# Patient Record
Sex: Female | Born: 1975 | Race: White | Hispanic: No | Smoking: Never smoker
Health system: Southern US, Community
[De-identification: ages and names within clinical notes are randomized; demographics above are authoritative.]

## PROBLEM LIST (undated history)

## (undated) DIAGNOSIS — R51 Headache: Secondary | ICD-10-CM

## (undated) DIAGNOSIS — J069 Acute upper respiratory infection, unspecified: Secondary | ICD-10-CM

## (undated) DIAGNOSIS — T7840XA Allergy, unspecified, initial encounter: Secondary | ICD-10-CM

## (undated) DIAGNOSIS — F410 Panic disorder [episodic paroxysmal anxiety] without agoraphobia: Secondary | ICD-10-CM

## (undated) DIAGNOSIS — R87619 Unspecified abnormal cytological findings in specimens from cervix uteri: Secondary | ICD-10-CM

## (undated) DIAGNOSIS — F32A Depression, unspecified: Secondary | ICD-10-CM

## (undated) DIAGNOSIS — IMO0002 Reserved for concepts with insufficient information to code with codable children: Secondary | ICD-10-CM

## (undated) DIAGNOSIS — M1990 Unspecified osteoarthritis, unspecified site: Secondary | ICD-10-CM

## (undated) DIAGNOSIS — J302 Other seasonal allergic rhinitis: Secondary | ICD-10-CM

## (undated) DIAGNOSIS — F329 Major depressive disorder, single episode, unspecified: Secondary | ICD-10-CM

## (undated) DIAGNOSIS — H9193 Unspecified hearing loss, bilateral: Secondary | ICD-10-CM

## (undated) DIAGNOSIS — J45909 Unspecified asthma, uncomplicated: Secondary | ICD-10-CM

## (undated) DIAGNOSIS — M7551 Bursitis of right shoulder: Secondary | ICD-10-CM

## (undated) DIAGNOSIS — H905 Unspecified sensorineural hearing loss: Secondary | ICD-10-CM

## (undated) DIAGNOSIS — Z975 Presence of (intrauterine) contraceptive device: Secondary | ICD-10-CM

## (undated) DIAGNOSIS — K219 Gastro-esophageal reflux disease without esophagitis: Secondary | ICD-10-CM

## (undated) DIAGNOSIS — Z8669 Personal history of other diseases of the nervous system and sense organs: Secondary | ICD-10-CM

## (undated) DIAGNOSIS — N809 Endometriosis, unspecified: Secondary | ICD-10-CM

## (undated) DIAGNOSIS — F419 Anxiety disorder, unspecified: Secondary | ICD-10-CM

## (undated) HISTORY — DX: Presence of (intrauterine) contraceptive device: Z97.5

## (undated) HISTORY — DX: Panic disorder (episodic paroxysmal anxiety): F41.0

## (undated) HISTORY — DX: Reserved for concepts with insufficient information to code with codable children: IMO0002

## (undated) HISTORY — DX: Unspecified abnormal cytological findings in specimens from cervix uteri: R87.619

## (undated) HISTORY — DX: Unspecified sensorineural hearing loss: H90.5

## (undated) HISTORY — DX: Personal history of other diseases of the nervous system and sense organs: Z86.69

## (undated) HISTORY — PX: WISDOM TOOTH EXTRACTION: SHX21

## (undated) HISTORY — DX: Unspecified osteoarthritis, unspecified site: M19.90

## (undated) HISTORY — PX: IMPLANTATION OF ENVOY ESTEEM HEARING DEVICE: SHX5558

## (undated) HISTORY — DX: Endometriosis, unspecified: N80.9

## (undated) HISTORY — DX: Unspecified asthma, uncomplicated: J45.909

## (undated) HISTORY — DX: Anxiety disorder, unspecified: F41.9

## (undated) HISTORY — DX: Acute upper respiratory infection, unspecified: J06.9

## (undated) HISTORY — DX: Allergy, unspecified, initial encounter: T78.40XA

## (undated) HISTORY — DX: Gastro-esophageal reflux disease without esophagitis: K21.9

---

## 1999-10-27 ENCOUNTER — Other Ambulatory Visit: Admission: RE | Admit: 1999-10-27 | Discharge: 1999-10-27 | Payer: Self-pay | Admitting: *Deleted

## 2000-10-31 ENCOUNTER — Other Ambulatory Visit: Admission: RE | Admit: 2000-10-31 | Discharge: 2000-10-31 | Payer: Self-pay | Admitting: *Deleted

## 2001-03-10 ENCOUNTER — Encounter: Payer: Self-pay | Admitting: Emergency Medicine

## 2001-03-10 ENCOUNTER — Emergency Department (HOSPITAL_COMMUNITY): Admission: EM | Admit: 2001-03-10 | Discharge: 2001-03-10 | Payer: Self-pay | Admitting: Emergency Medicine

## 2001-09-26 ENCOUNTER — Other Ambulatory Visit: Admission: RE | Admit: 2001-09-26 | Discharge: 2001-09-26 | Payer: Self-pay | Admitting: Obstetrics and Gynecology

## 2002-09-28 ENCOUNTER — Other Ambulatory Visit: Admission: RE | Admit: 2002-09-28 | Discharge: 2002-09-28 | Payer: Self-pay | Admitting: Obstetrics and Gynecology

## 2002-12-01 ENCOUNTER — Emergency Department (HOSPITAL_COMMUNITY): Admission: EM | Admit: 2002-12-01 | Discharge: 2002-12-02 | Payer: Self-pay

## 2003-01-29 ENCOUNTER — Ambulatory Visit (HOSPITAL_COMMUNITY): Admission: RE | Admit: 2003-01-29 | Discharge: 2003-01-29 | Payer: Self-pay | Admitting: Neurology

## 2003-10-12 ENCOUNTER — Other Ambulatory Visit: Admission: RE | Admit: 2003-10-12 | Discharge: 2003-10-12 | Payer: Self-pay | Admitting: Obstetrics and Gynecology

## 2004-08-16 ENCOUNTER — Ambulatory Visit: Payer: Self-pay | Admitting: Internal Medicine

## 2004-08-25 ENCOUNTER — Ambulatory Visit: Payer: Self-pay | Admitting: Internal Medicine

## 2004-10-25 ENCOUNTER — Ambulatory Visit: Payer: Self-pay | Admitting: Internal Medicine

## 2004-11-24 ENCOUNTER — Ambulatory Visit: Payer: Self-pay | Admitting: Internal Medicine

## 2005-02-28 ENCOUNTER — Ambulatory Visit: Payer: Self-pay | Admitting: Internal Medicine

## 2005-05-29 ENCOUNTER — Ambulatory Visit: Payer: Self-pay | Admitting: Internal Medicine

## 2006-05-08 ENCOUNTER — Ambulatory Visit: Payer: Self-pay | Admitting: Internal Medicine

## 2007-03-13 ENCOUNTER — Other Ambulatory Visit: Admission: RE | Admit: 2007-03-13 | Discharge: 2007-03-13 | Payer: Self-pay | Admitting: Obstetrics & Gynecology

## 2007-03-20 ENCOUNTER — Encounter: Payer: Self-pay | Admitting: Internal Medicine

## 2007-03-20 DIAGNOSIS — E538 Deficiency of other specified B group vitamins: Secondary | ICD-10-CM | POA: Insufficient documentation

## 2007-08-03 ENCOUNTER — Ambulatory Visit (HOSPITAL_COMMUNITY): Admission: RE | Admit: 2007-08-03 | Discharge: 2007-08-03 | Payer: Self-pay

## 2007-10-28 ENCOUNTER — Encounter: Admission: RE | Admit: 2007-10-28 | Discharge: 2007-10-28 | Payer: Self-pay | Admitting: Neurology

## 2008-03-22 ENCOUNTER — Other Ambulatory Visit: Admission: RE | Admit: 2008-03-22 | Discharge: 2008-03-22 | Payer: Self-pay | Admitting: Obstetrics & Gynecology

## 2008-08-02 ENCOUNTER — Other Ambulatory Visit: Admission: RE | Admit: 2008-08-02 | Discharge: 2008-08-02 | Payer: Self-pay | Admitting: Obstetrics & Gynecology

## 2009-03-09 HISTORY — PX: BREAST REDUCTION SURGERY: SHX8

## 2009-04-08 DIAGNOSIS — N809 Endometriosis, unspecified: Secondary | ICD-10-CM

## 2009-04-08 HISTORY — DX: Endometriosis, unspecified: N80.9

## 2009-04-08 HISTORY — PX: TUBAL LIGATION: SHX77

## 2009-04-18 ENCOUNTER — Ambulatory Visit (HOSPITAL_COMMUNITY): Admission: RE | Admit: 2009-04-18 | Discharge: 2009-04-18 | Payer: Self-pay | Admitting: Obstetrics & Gynecology

## 2010-10-12 LAB — CBC
HCT: 39.6 % (ref 36.0–46.0)
Hemoglobin: 13.4 g/dL (ref 12.0–15.0)
MCHC: 33.7 g/dL (ref 30.0–36.0)
MCV: 89.9 fL (ref 78.0–100.0)
Platelets: 229 10*3/uL (ref 150–400)
RBC: 4.41 MIL/uL (ref 3.87–5.11)
RDW: 12.2 % (ref 11.5–15.5)
WBC: 6.4 10*3/uL (ref 4.0–10.5)

## 2010-10-12 LAB — URINALYSIS, ROUTINE W REFLEX MICROSCOPIC
Protein, ur: NEGATIVE mg/dL
Urobilinogen, UA: 0.2 mg/dL (ref 0.0–1.0)

## 2011-08-30 LAB — HM PAP SMEAR

## 2012-07-09 HISTORY — PX: COCHLEAR IMPLANT: SUR684

## 2012-07-31 ENCOUNTER — Other Ambulatory Visit: Payer: Self-pay | Admitting: Otolaryngology

## 2012-07-31 DIAGNOSIS — H903 Sensorineural hearing loss, bilateral: Secondary | ICD-10-CM

## 2012-07-31 DIAGNOSIS — H905 Unspecified sensorineural hearing loss: Secondary | ICD-10-CM

## 2012-08-05 ENCOUNTER — Other Ambulatory Visit: Payer: Self-pay

## 2012-08-15 ENCOUNTER — Ambulatory Visit
Admission: RE | Admit: 2012-08-15 | Discharge: 2012-08-15 | Disposition: A | Discharge status: Home / Self Care | Payer: 59 | Source: Ambulatory Visit | Attending: Otolaryngology | Admitting: Otolaryngology

## 2012-08-15 DIAGNOSIS — H903 Sensorineural hearing loss, bilateral: Secondary | ICD-10-CM

## 2012-08-15 DIAGNOSIS — H905 Unspecified sensorineural hearing loss: Secondary | ICD-10-CM

## 2012-09-25 ENCOUNTER — Ambulatory Visit (INDEPENDENT_AMBULATORY_CARE_PROVIDER_SITE_OTHER): Payer: 59 | Admitting: Obstetrics & Gynecology

## 2012-09-25 ENCOUNTER — Ambulatory Visit: Admit: 2012-09-25 | Payer: Self-pay | Admitting: Otolaryngology

## 2012-09-25 ENCOUNTER — Encounter: Payer: Self-pay | Admitting: Obstetrics & Gynecology

## 2012-09-25 VITALS — BP 120/68 | Ht 66.0 in | Wt 162.2 lb

## 2012-09-25 DIAGNOSIS — Z0000 Encounter for general adult medical examination without abnormal findings: Secondary | ICD-10-CM

## 2012-09-25 DIAGNOSIS — Z01419 Encounter for gynecological examination (general) (routine) without abnormal findings: Secondary | ICD-10-CM

## 2012-09-25 LAB — POCT URINALYSIS DIPSTICK
Blood, UA: NEGATIVE
Nitrite, UA: NEGATIVE
Urobilinogen, UA: NEGATIVE
pH, UA: 5

## 2012-09-25 SURGERY — INSERTION, IMPLANT, COCHLEAR
Anesthesia: General | Site: Ear | Laterality: Left

## 2012-09-25 MED ORDER — ONDANSETRON 4 MG PO TBDP: 4.0000 mg | ORAL_TABLET | Freq: Three times a day (TID) | ORAL | Status: DC | PRN

## 2012-09-25 NOTE — Progress Notes (Signed)
37 y.o. G2P1 MarriedCaucasianF here for annual exam.  Cochlear implant surgery planned for next week.  Has appt with Dr. Ricki Mase Dhondt in April.  After IUD was placed, she had several months of doing really well.  For the last six months, she has nausea/emesis, then a migraine, then period starts.  Officially divorced.  Was in [place] last week for pleasure.     Patient's last menstrual period was 09/23/2012.           Sexually active: yes  The current method of family planning is tubal ligation and IUD.    Exercising: yes  Home exercise routine includes running, eliptical, and kettle bell. Smoker:  no  Health Maintenance: Pap:  08/30/2011 MMG:  02/06/2011 Colonoscopy:  n/a BMD:   n/a TDaP:  08/30/11   reports that she has never smoked. She has never used smokeless tobacco. She reports that  drinks alcohol. She reports that she does not use illicit drugs.  Past Medical History  Diagnosis Date  . Congenital hearing loss     started age 23  . Endometriosis 10/10  . Hx of migraines     increased frequency with estrogen containing OCPs  . Abnormal Pap smear     history of colposcopy ECC with atypical squamous metaplasia    Past Surgical History  Procedure Laterality Date  . Implantation of envoy esteem hearing device      for hearing loss  . Breast reduction surgery  9/10  . Tubal ligation  10/10    cautery of endometriosis    Current Outpatient Prescriptions  Medication Sig Dispense Refill  . ALPRAZolam (XANAX) 0.5 MG tablet Take 0.5 mg by mouth as needed for sleep.      . clindamycin (CLEOCIN) 300 MG capsule       . clonazePAM (KLONOPIN) 1 MG tablet Take 1 mg by mouth at bedtime as needed for anxiety.      . Cyclobenzaprine HCl (FLEXERIL PO) Take by mouth as needed.      Marland Kitchen Fexofenadine HCl (ALLEGRA PO) Take by mouth as needed.      . Levonorgestrel (MIRENA IU) by Intrauterine route.      . Multiple Vitamins-Minerals (MULTIVITAMIN GUMMIES ADULT PO) Take by mouth daily.      Marland Kitchen  tobramycin-dexamethasone (TOBRADEX) ophthalmic solution As needed      . vitamin C (ASCORBIC ACID) 500 MG tablet Take 500 mg by mouth daily.       No current facility-administered medications for this visit.    Family History  Problem Relation Age of Onset  . Deafness Mother   . Diabetes Mother   . Cancer Mother 50    ovarian  . Heart Problems Mother     tachycardia  . Deafness Father   . Hypertension Father   . Diabetes Father   . Down syndrome Sister   . Diabetes Paternal Grandmother   . Cirrhosis Father     non alcoholic    ROS:  Pertinent items are noted in HPI.  Otherwise, a comprehensive ROS was negative.  Exam:   BP 120/68  Ht 5\' 6"  (1.676 m)  Wt 162 lb 3.2 oz (73.573 kg)  BMI 26.19 kg/m2  LMP 09/23/2012  Height: 5\' 6"  (167.6 cm)  Ht Readings from Last 3 Encounters:  09/25/12 5\' 6"  (1.676 m)    General appearance: alert, cooperative and appears stated age Head: Normocephalic, without obvious abnormality, atraumatic Neck: no adenopathy, supple, symmetrical, trachea midline and thyroid not enlarged, symmetric, no  tenderness/mass/nodules Lungs: clear to auscultation bilaterally Breasts: Inspection negative, No nipple retraction or dimpling, No nipple discharge or bleeding, No axillary or supraclavicular adenopathy, Normal to palpation without dominant masses Heart: regular rate and rhythm Abdomen: soft, non-tender; bowel sounds normal; no masses,  no organomegaly Extremities: extremities normal, atraumatic, no cyanosis or edema Skin: Skin color, texture, turgor normal. No rashes or lesions Lymph nodes: Cervical, supraclavicular, and axillary nodes normal. No abnormal inguinal nodes palpated Neurologic: Grossly normal   Pelvic: External genitalia:  no lesions              Urethra:  normal appearing urethra with no masses, tenderness or lesions              Bartholins and Skenes: normal                 Vagina: normal appearing vagina with normal color and  discharge, no lesions              Cervix: no lesions, IUD string noted              Pap taken: no Bimanual Exam:  Uterus:  normal size, contour, position, consistency, mobility, non-tender              Adnexa: normal adnexa               Rectovaginal: Confirms               Anus:  normal sphincter tone, no lesions  A:  Well Woman with normal exam, endometriosis, hearing loss--cochlear implant planned, pre-cycle nausea/emesis/migraines  P:   IUD for contraception/bleeding treatment Trial of Zofran for nausea.  Cannot use migraine medications.  Other antiemetics makes her too nauseated. Return annually or prn  An After Visit Summary was printed and given to the patient.

## 2012-09-25 NOTE — Addendum Note (Signed)
Addended by: Jerene Bears on: 09/25/2012 05:01 PM   Modules accepted: Orders

## 2012-09-25 NOTE — Patient Instructions (Addendum)
Follow up one year or earlier if having gynecologic issues. Ultrasound will be scheduled and you will be called.  If you have not heard from our office within one week--please call back.

## 2013-01-16 ENCOUNTER — Telehealth: Payer: Self-pay | Admitting: Obstetrics & Gynecology

## 2013-01-16 DIAGNOSIS — Z8041 Family history of malignant neoplasm of ovary: Secondary | ICD-10-CM

## 2013-01-16 NOTE — Telephone Encounter (Signed)
Patient is ready to schedule ultrasound appointment with Dr. Hyacinth Meeker.

## 2013-01-28 NOTE — Telephone Encounter (Signed)
Last AEX 09/25/2012 EPIC per Dr. Hyacinth Meeker Last vaginal ultra sound done 08/30/2010 report in paper chart. Patient states she had talked with Dr. Hyacinth Meeker after her last AEX in march and request to schedule at the end of the year when she had met her insurance deductible. Request to schedule this in November ,2014. Stated she had not heard from any one concerning scheduling her ultra sound.  Please advise.

## 2013-01-28 NOTE — Telephone Encounter (Signed)
Call to patient and PUS scheduled for 05-14-13 and consult to follow with Dr Hyacinth Meeker.

## 2013-01-28 NOTE — Telephone Encounter (Signed)
Pt says she has not gotten a call back yet concerning scheduling a ultrasound appointment.

## 2013-01-28 NOTE — Telephone Encounter (Signed)
Margaret Ford, Can you please schedule u/s for Margaret Ford.  Her mother died from ovarian cancer.  She has contemplated hysterectomy/ovary removal but it trying to get well into her 40's before doing this.  Looks like Arna Medici took original msg 7/11 and sent it to [employer] [employer].  Thanks.  MSM

## 2013-02-13 ENCOUNTER — Ambulatory Visit (INDEPENDENT_AMBULATORY_CARE_PROVIDER_SITE_OTHER): Payer: 59 | Admitting: Certified Nurse Midwife

## 2013-02-13 ENCOUNTER — Encounter: Payer: Self-pay | Admitting: Certified Nurse Midwife

## 2013-02-13 VITALS — BP 100/62 | HR 64 | Temp 98.4°F | Resp 16 | Ht 66.0 in | Wt 165.0 lb

## 2013-02-13 DIAGNOSIS — N390 Urinary tract infection, site not specified: Secondary | ICD-10-CM | POA: Insufficient documentation

## 2013-02-13 LAB — POCT URINALYSIS DIPSTICK
Ketones, UA: NEGATIVE
Protein, UA: NEGATIVE
pH, UA: 5

## 2013-02-13 MED ORDER — NITROFURANTOIN MONOHYD MACRO 100 MG PO CAPS: 100.0000 mg | ORAL_CAPSULE | Freq: Two times a day (BID) | ORAL | Status: DC

## 2013-02-13 NOTE — Progress Notes (Signed)
37 y.o.DivorcedCaucasian female G2P1 with a 3 day(s) history of the following: urinary pain. Sexually active: yes Last sexual activity:8days ago. Pt also reports the following associated symptoms: urinary frequency and urinary urgency for 2 - 3 days. Denies vaginal symptoms or new personal products. Patient has not tried over the counter treatment. Denies fever, chills or back pain.     Exam:  [id number], Urethra, Skene's normal                Vag:no lesions                Cx:  normal appearance IUD string noted                Uterus:normal size, non-tender, normal shape and consistency                Adnexa: normal adnexa and no mass, fullness, tenderness  Poct urine- wbc 2+  A: UTI  P;Reviewed findings of UTI, warning signs given. [id number] culture Rx Macrobid see order  Rv prn or if culture is positive will need TOC

## 2013-02-14 NOTE — Progress Notes (Signed)
Note reviewed, agree with plan.  Sicily Zaragoza, MD  

## 2013-02-16 LAB — URINE CULTURE

## 2013-02-17 ENCOUNTER — Other Ambulatory Visit: Payer: Self-pay | Admitting: Certified Nurse Midwife

## 2013-02-17 DIAGNOSIS — N390 Urinary tract infection, site not specified: Secondary | ICD-10-CM

## 2013-02-17 MED ORDER — SULFAMETHOXAZOLE-TMP DS 800-160 MG PO TABS: 1.0000 | ORAL_TABLET | Freq: Two times a day (BID) | ORAL | Status: DC

## 2013-02-18 ENCOUNTER — Telehealth: Payer: Self-pay

## 2013-02-18 NOTE — Telephone Encounter (Signed)
Patient notified

## 2013-02-18 NOTE — Telephone Encounter (Signed)
Message copied by Eliezer Bottom on Wed Feb 18, 2013 10:07 AM ------      Message from: Verner Chol      Created: Tue Feb 17, 2013  9:56 PM       Notify patient urine culture positive Klebsiella      Needs change in medication, due to sensitivity      Needs RX Septra order in      Needs TOC 2 weeks order in ------

## 2013-02-18 NOTE — Telephone Encounter (Signed)
lmtcb

## 2013-03-05 ENCOUNTER — Other Ambulatory Visit: Payer: 59

## 2013-03-06 ENCOUNTER — Ambulatory Visit (INDEPENDENT_AMBULATORY_CARE_PROVIDER_SITE_OTHER): Payer: 59

## 2013-03-06 VITALS — BP 110/64 | HR 64 | Resp 16 | Ht 66.0 in

## 2013-03-06 DIAGNOSIS — N390 Urinary tract infection, site not specified: Secondary | ICD-10-CM

## 2013-03-06 NOTE — Progress Notes (Signed)
Pt states no problems today. Urine sent for culture

## 2013-03-08 LAB — URINE CULTURE
Colony Count: NO GROWTH
Organism ID, Bacteria: NO GROWTH

## 2013-03-10 ENCOUNTER — Telehealth: Payer: Self-pay

## 2013-03-10 NOTE — Telephone Encounter (Signed)
Message copied by Eliezer Bottom on Tue Mar 10, 2013  2:22 PM ------      Message from: Verner Chol      Created: Sun Mar 08, 2013  8:55 PM       Notify urine culture negative ------

## 2013-03-10 NOTE — Telephone Encounter (Signed)
lmtcb

## 2013-03-10 NOTE — Telephone Encounter (Signed)
Patient notified

## 2013-03-10 NOTE — Telephone Encounter (Signed)
Patient returning Joy's call. "Okay to leave information on voice mail," per patient.

## 2013-04-03 ENCOUNTER — Encounter: Payer: Self-pay | Admitting: Obstetrics & Gynecology

## 2013-05-14 ENCOUNTER — Ambulatory Visit (INDEPENDENT_AMBULATORY_CARE_PROVIDER_SITE_OTHER): Payer: 59 | Admitting: Obstetrics & Gynecology

## 2013-05-14 ENCOUNTER — Ambulatory Visit (INDEPENDENT_AMBULATORY_CARE_PROVIDER_SITE_OTHER): Payer: 59

## 2013-05-14 VITALS — BP 102/76 | Ht 66.0 in | Wt 161.4 lb

## 2013-05-14 DIAGNOSIS — Z8041 Family history of malignant neoplasm of ovary: Secondary | ICD-10-CM

## 2013-05-14 DIAGNOSIS — N949 Unspecified condition associated with female genital organs and menstrual cycle: Secondary | ICD-10-CM

## 2013-05-14 DIAGNOSIS — Z8742 Personal history of other diseases of the female genital tract: Secondary | ICD-10-CM

## 2013-05-14 NOTE — Progress Notes (Signed)
37 y.o.Divorcedfemale here for a pelvic ultrasound due to family hx of ovarian cancer.  Pt reports cousin, age 65, with breast cancer.  Also reports grandmother has some sort of gyn cancer issue and had complete hysterectomy in her 30's, including ovary removal.  Grandmother is alive but has Alzheimer's and cannot give any history.  No LMP recorded. Patient is not currently having periods (Reason: IUD).  Sexually active:  yes  Contraception: IUD  FINDINGS: UTERUS: 8.1 x 4.5 x 3.3cm without any other abnormalities.  IUD in correct location. EMS: 3.53mm ADNEXA:   Left ovary 3.3 x 2.6 x 1.7cm with 14 mm dominant follicle   Right ovary 2.3 x 1.6 x 1.2cm CUL DE SAC: no free fluid  Pictures reviewed with patient.  All questions answered.  "Screening" for ovarian cancer discussed, with positive and negative aspects discussed.  With new family hx that patient gives, discused genetic testing for BRCA 1/2.  She has considered this in the past.  Cannot guarantee coverage but she would like to proceed.  Aware increased surveillance vs surgical treatment options vs oral chemotherapy for prevention are all options if testing is positive.  Voices clear understanding.  Assessment:  Family hx of breast cancer.  IUD in correct location. Plan:  Ca-125 today BRCA 1/2 testing obtained.  ~20 minutes spent with patient >50% of time was in face to face discussion of above.

## 2013-05-14 NOTE — Patient Instructions (Signed)
We will call you with results  

## 2013-05-16 ENCOUNTER — Encounter: Payer: Self-pay | Admitting: Obstetrics & Gynecology

## 2013-05-18 ENCOUNTER — Telehealth: Payer: Self-pay

## 2013-05-18 NOTE — Telephone Encounter (Signed)
Message copied by Elisha Headland on Mon May 18, 2013  2:09 PM ------      Message from: Jerene Bears      Created: Sat May 16, 2013  6:16 AM       Inform ca-125 normal ------

## 2013-05-18 NOTE — Telephone Encounter (Signed)
lmtcb

## 2013-05-20 ENCOUNTER — Encounter: Payer: Self-pay | Admitting: Obstetrics & Gynecology

## 2013-05-20 MED ORDER — CYCLOBENZAPRINE HCL 10 MG PO TABS: 10.0000 mg | ORAL_TABLET | Freq: Three times a day (TID) | ORAL | Status: DC | PRN

## 2013-05-20 NOTE — Telephone Encounter (Signed)
Rx to pharmacy after MyChart message from patient.  Uses flexeril with menstrual cycles.  It helps much more than anti-inflammatories.  Uses maybe one prescription in calendar year.

## 2013-06-01 ENCOUNTER — Telehealth: Payer: Self-pay | Admitting: *Deleted

## 2013-06-01 NOTE — Telephone Encounter (Signed)
Patient returned call. Notified BRCA testing negative per Dr Hyacinth Meeker. See scanned results.  Routing to provider for final review. Patient agreeable to disposition. Will close encounter

## 2013-06-01 NOTE — Telephone Encounter (Signed)
Calling patient with lab results, LMTCB.

## 2013-06-22 NOTE — Telephone Encounter (Signed)
Patient notified of results.

## 2013-09-30 ENCOUNTER — Ambulatory Visit: Payer: 59 | Admitting: Obstetrics & Gynecology

## 2013-10-02 ENCOUNTER — Encounter: Payer: Self-pay | Admitting: Obstetrics & Gynecology

## 2013-10-02 ENCOUNTER — Ambulatory Visit (INDEPENDENT_AMBULATORY_CARE_PROVIDER_SITE_OTHER): Payer: 59 | Admitting: Obstetrics & Gynecology

## 2013-10-02 VITALS — BP 100/64 | HR 60 | Resp 16 | Ht 66.0 in | Wt 162.2 lb

## 2013-10-02 DIAGNOSIS — Z01419 Encounter for gynecological examination (general) (routine) without abnormal findings: Secondary | ICD-10-CM

## 2013-10-02 DIAGNOSIS — N809 Endometriosis, unspecified: Secondary | ICD-10-CM

## 2013-10-02 DIAGNOSIS — Z202 Contact with and (suspected) exposure to infections with a predominantly sexual mode of transmission: Secondary | ICD-10-CM

## 2013-10-02 DIAGNOSIS — Z124 Encounter for screening for malignant neoplasm of cervix: Secondary | ICD-10-CM

## 2013-10-02 DIAGNOSIS — Z0000 Encounter for general adult medical examination without abnormal findings: Secondary | ICD-10-CM

## 2013-10-02 LAB — POCT URINALYSIS DIPSTICK
BILIRUBIN UA: NEGATIVE
GLUCOSE UA: NEGATIVE
KETONES UA: NEGATIVE
NITRITE UA: NEGATIVE
PH UA: 5
Protein, UA: NEGATIVE
RBC UA: NEGATIVE
Urobilinogen, UA: NEGATIVE

## 2013-10-02 NOTE — Progress Notes (Signed)
38 y.o. G2P1 DivorcedCaucasianF here for annual exam.  Bleeding only once every 4-5 months.  Spotting only.  Did have pain with it.  Otherwise, not having any pain.    Sees Dr. Minna Antis next month.  Does blood work with him.  On Viibryd 20mg  and doing really well with this.  New sex partner.  Will do Gc/Chl testing today.  Patient's last menstrual period was 07/09/2013.          Sexually active: yes  The current method of family planning is tubal ligation and IUD.    Exercising: yes  walking and running Smoker:  no  Health Maintenance: Pap:  08/30/11 WNL/negative HR HPV History of abnormal Pap:  Yes-LGSIL, colpo/ECC with atypical squ metaplasia MMG:  08/08/10 MMG, 7/12 6 month f/u-normal Colonoscopy:  none BMD:   none TDaP:  08/30/11 Screening Labs: PCP, Hb today: PCP, Urine today: WBC-trace   reports that she has never smoked. She has never used smokeless tobacco. She reports that she drinks alcohol. She reports that she does not use illicit drugs.  Past Medical History  Diagnosis Date  . Congenital hearing loss     started age 84  . Endometriosis 10/10  . Hx of migraines     increased frequency with estrogen containing OCPs  . Abnormal Pap smear     history of colposcopy ECC with atypical squamous metaplasia    Past Surgical History  Procedure Laterality Date  . Implantation of envoy esteem hearing device      for hearing loss  . Breast reduction surgery  9/10  . Tubal ligation  10/10    cautery of endometriosis    Current Outpatient Prescriptions  Medication Sig Dispense Refill  . clonazePAM (KLONOPIN) 1 MG tablet Take 1 mg by mouth at bedtime as needed for anxiety.      . cyclobenzaprine (FLEXERIL) 10 MG tablet Take 1 tablet (10 mg total) by mouth 3 (three) times daily as needed for muscle spasms.  30 tablet  1  . ELDERBERRY PO Take by mouth daily.      Marland Kitchen Fexofenadine HCl (ALLEGRA PO) Take by mouth as needed.      . Levonorgestrel (MIRENA IU) by Intrauterine route.       . Multiple Vitamins-Minerals (MULTIVITAMIN GUMMIES ADULT PO) Take by mouth daily. Super food      . VIIBRYD 20 MG TABS       . Cholecalciferol (VITAMIN D PO) Take 2,000 Int'l Units by mouth daily.      Marland Kitchen PROAIR HFA 108 (90 BASE) MCG/ACT inhaler        No current facility-administered medications for this visit.    Family History  Problem Relation Age of Onset  . Deafness Mother   . Diabetes Mother   . Cancer Mother 4    ovarian  . Heart Problems Mother     tachycardia  . Deafness Father   . Hypertension Father   . Diabetes Father   . Down syndrome Sister   . Diabetes Paternal Grandmother   . Cirrhosis Father     non alcoholic    ROS:  Pertinent items are noted in HPI.  Otherwise, a comprehensive ROS was negative.  Exam:   BP 100/64  Pulse 60  Resp 16  Ht 5\' 6"  (1.676 m)  Wt 162 lb 3.2 oz (73.573 kg)  BMI 26.19 kg/m2  LMP 07/09/2013  Weight change: +10lbs  Height: 5\' 6"  (167.6 cm)  Ht Readings from Last 3 Encounters:  10/02/13 5\' 6"  (1.676 m)  05/14/13 5\' 6"  (1.676 m)  03/06/13 5\' 6"  (1.676 m)    General appearance: alert, cooperative and appears stated age Head: Normocephalic, without obvious abnormality, atraumatic Neck: no adenopathy, supple, symmetrical, trachea midline and thyroid normal to inspection and palpation Lungs: clear to auscultation bilaterally Breasts: normal appearance, no masses or tenderness Heart: regular rate and rhythm Abdomen: soft, non-tender; bowel sounds normal; no masses,  no organomegaly Extremities: extremities normal, atraumatic, no cyanosis or edema Skin: Skin color, texture, turgor normal. No rashes or lesions Lymph nodes: Cervical, supraclavicular, and axillary nodes normal. No abnormal inguinal nodes palpated Neurologic: Grossly normal   Pelvic: External genitalia:  no lesions              Urethra:  normal appearing urethra with no masses, tenderness or lesions              Bartholins and Skenes: normal                  Vagina: normal appearing vagina with normal color and discharge, no lesions              Cervix: no lesions              Pap taken: yes Bimanual Exam:  Uterus:  normal size, contour, position, consistency, mobility, non-tender              Adnexa: normal adnexa and no mass, fullness, tenderness               Rectovaginal: Confirms               Anus:  normal sphincter tone, no lesions  A:  Well Woman with normal exam Family hx of ovarian cancer in mother (deceased) IUD for bleeding issues, placed 11/12 Endometriosis diagnosed with laparoscopy 10/10 with BTL  P:   mammogram pap smear only.  Neg HR HPV 3/13 GC/Chl today Yearly U/S and ca-125.  Will return in six months for this. return annually or prn  An After Visit Summary was printed and given to the patient.

## 2013-10-05 LAB — IPS PAP SMEAR ONLY

## 2013-10-06 LAB — IPS N GONORRHOEA AND CHLAMYDIA BY PCR

## 2014-03-22 ENCOUNTER — Telehealth: Payer: Self-pay | Admitting: Obstetrics & Gynecology

## 2014-03-22 ENCOUNTER — Encounter: Payer: Self-pay | Admitting: Obstetrics & Gynecology

## 2014-03-22 NOTE — Telephone Encounter (Signed)
Spoke with patient. Advised that Dr Sabra Heck will be out of the office 10.01.2015 and that we will need to reschedule per PUS. Patient agreeable. Scheduled 09.24.2015.

## 2014-03-22 NOTE — Telephone Encounter (Signed)
Left message for patient to call back. Need to reschedule PUS °

## 2014-03-22 NOTE — Telephone Encounter (Signed)
Margaret Ford has contacted patient and she is rescheduled for 04-01-14. Encounter closed.

## 2014-03-22 NOTE — Telephone Encounter (Signed)
Patient returning Sabrina's call. °

## 2014-03-22 NOTE — Telephone Encounter (Signed)
Please call and reschedule this patient's PUS appointments from 04/08/14 to another day. Dr. Sabra Heck will not be in that whole week.

## 2014-03-24 ENCOUNTER — Telehealth: Payer: Self-pay | Admitting: Emergency Medicine

## 2014-03-24 NOTE — Telephone Encounter (Signed)
A user error has taken place: encounter opened in error, closed for administrative reasons.

## 2014-03-24 NOTE — Telephone Encounter (Signed)
Dr. Sabra Heck, patient has been contacted and rescheduled for pelvic ultrasound by Sabrina.

## 2014-04-01 ENCOUNTER — Encounter: Payer: Self-pay | Admitting: Obstetrics & Gynecology

## 2014-04-01 ENCOUNTER — Ambulatory Visit (INDEPENDENT_AMBULATORY_CARE_PROVIDER_SITE_OTHER): Payer: 59 | Admitting: Obstetrics & Gynecology

## 2014-04-01 ENCOUNTER — Telehealth: Payer: Self-pay | Admitting: Obstetrics & Gynecology

## 2014-04-01 ENCOUNTER — Ambulatory Visit (INDEPENDENT_AMBULATORY_CARE_PROVIDER_SITE_OTHER): Payer: 59

## 2014-04-01 VITALS — BP 110/64 | Ht 66.0 in | Wt 163.0 lb

## 2014-04-01 DIAGNOSIS — Z30431 Encounter for routine checking of intrauterine contraceptive device: Secondary | ICD-10-CM

## 2014-04-01 DIAGNOSIS — N809 Endometriosis, unspecified: Secondary | ICD-10-CM

## 2014-04-01 DIAGNOSIS — N9489 Other specified conditions associated with female genital organs and menstrual cycle: Secondary | ICD-10-CM

## 2014-04-01 DIAGNOSIS — N898 Other specified noninflammatory disorders of vagina: Secondary | ICD-10-CM

## 2014-04-01 DIAGNOSIS — Z30430 Encounter for insertion of intrauterine contraceptive device: Secondary | ICD-10-CM

## 2014-04-01 DIAGNOSIS — N939 Abnormal uterine and vaginal bleeding, unspecified: Secondary | ICD-10-CM

## 2014-04-01 NOTE — Progress Notes (Signed)
38 y.o. G2P1 Divorcedfemale here for a pelvic ultrasound due to increased pelvic pain, family hx of ovarian cancer in her mother, and vaginal bleeding.  Pt has IUD for cycle control.  Has h/o BTL for contraception.  Pt has done very well with IUD and has basically been amenorrheic until this summer.  She cycled in July, Aug, and again in September. Last month, she has migraine headaches and nausea just like before the IUD was placed.    No LMP recorded. Patient is not currently having periods (Reason: IUD).  Sexually active:  no  Contraception: BLT  FINDINGS: UTERUS: 7.5 x 4.3 x 3.7cm EMS: 6.63mm with centrally located IUD ADNEXA:   Left ovary 2.6 x 2.1 x 1.9cm   Right ovary 3.3 x 2.7 x 2.2cm with 1.4cm corpus luteal cyst CUL DE SAC: no free fluid  Images reviewed with pt.  She does have a small hemorrhagic corpus luteal cyst.  This appears completely benign and I do not think it needs ot be followed.  Pt comfortable with this plan.  As she has started cycling again and is having increased symptoms from this, I feel removing and replacing IUD a little early (due 11/16) will probably make her amenorrheic again.  Pt wants to make sure insurance will cover.  We will need to check on this.  Pt in agreement with plan.  Assessment:  Bleeding with Mirena IUD in pt that has been amenorrheic for almost 4 years Centrally located Mirena IUD Right hemorrhagic corpus luteal cyst  Plan: Remove and replace Mirena IUD.  Will precert first.  ~75 minutes spent with patient >50% of time was in face to face discussion of above.

## 2014-04-01 NOTE — Telephone Encounter (Signed)
Spoke with patient. Advised that benefits were checked under her new plan and that per benefit quote received, she will be responsible to pay $112.27 today when she comes in for PUS. Patient agreeable.

## 2014-04-05 ENCOUNTER — Telehealth: Payer: Self-pay | Admitting: Obstetrics & Gynecology

## 2014-04-05 NOTE — Telephone Encounter (Signed)
Returning a call to Sabrina. °

## 2014-04-05 NOTE — Telephone Encounter (Signed)
Left message for patient to call back. Need to go over iud benefits ($25copay). Okay to schedule whenever per MSM.

## 2014-04-05 NOTE — Telephone Encounter (Signed)
Spoke with patient. Advised that per benefit quote received, she will be responsible to pay a $25 copay when she comes in for IUD removal and reinsertion. Patient agreeable. Scheduled appt

## 2014-04-05 NOTE — Addendum Note (Signed)
Addended by: Michele Mcalpine on: 04/05/2014 03:44 PM   Modules accepted: Orders

## 2014-04-05 NOTE — Telephone Encounter (Signed)
Pt is calling Margaret Ford back

## 2014-04-08 ENCOUNTER — Other Ambulatory Visit: Payer: 59

## 2014-04-08 ENCOUNTER — Other Ambulatory Visit: Payer: 59 | Admitting: Obstetrics & Gynecology

## 2014-04-29 ENCOUNTER — Encounter: Payer: Self-pay | Admitting: Obstetrics & Gynecology

## 2014-05-07 ENCOUNTER — Encounter: Payer: Self-pay | Admitting: Obstetrics & Gynecology

## 2014-05-07 ENCOUNTER — Ambulatory Visit (INDEPENDENT_AMBULATORY_CARE_PROVIDER_SITE_OTHER): Payer: 59 | Admitting: Obstetrics & Gynecology

## 2014-05-07 VITALS — BP 96/60 | HR 64 | Resp 16 | Ht 66.0 in | Wt 166.0 lb

## 2014-05-07 DIAGNOSIS — N858 Other specified noninflammatory disorders of uterus: Secondary | ICD-10-CM

## 2014-05-07 DIAGNOSIS — Z30433 Encounter for removal and reinsertion of intrauterine contraceptive device: Secondary | ICD-10-CM

## 2014-05-07 DIAGNOSIS — Z30430 Encounter for insertion of intrauterine contraceptive device: Secondary | ICD-10-CM

## 2014-05-07 DIAGNOSIS — N920 Excessive and frequent menstruation with regular cycle: Secondary | ICD-10-CM

## 2014-05-07 DIAGNOSIS — N809 Endometriosis, unspecified: Secondary | ICD-10-CM

## 2014-05-07 NOTE — Progress Notes (Signed)
38 y.o. G2P1 Divorced Caucasian female presents for removal of and then re-insertion of new Mirena IUD.  H/O menorrhagia and endometriosis.  Bleeding and pain has been well controlled with IUD until the last few months.  Pt has been experiencing increased bleeding over the past few months with an increase in her headaches as well.  This only started with the bleeding.  Before that time, she had amenorrhea and was really quite good.  IUD due to be replaced next year so we are doing this early.  Currently, she denies any vaginal symptoms or STD concerns.  GC/Chl testing done 10/02/13 due to new partner.  Still with same person.  Testing was negative.    LMP:  Does not exactly cycle but does bleed from time to time  Healthy female: WNWD WF, NAD  Pelvic exam: Vulva;normal Vagina:normal vagina Cervix:Non-tender, Negative CMT, no lesions or redness, IUD string noted Uterus:normal shape, position and consistency   After patient read information booklet and all questions were answered, informed consent was obtained.    Procedure:  Speculum inserted into vagina. Cervix visualized and cleansed with betadine solution X 3. Paracervical block was placed.  1% Lidocaine was used.  10cc total.  Tenaculum placed on cervix at 12 o'clock position.  IUD strings noted and grasped with ringed forceps.  IUD removed with one pull.  Pt and CMA, [employer], both visualized IUD before discarding.  Then uterus sounded to 7.5 centimeters.  IUD removed from sterile packet and under sterile conditions inserted to fundus of uterus.  Introducer removed without difficulty.  IUD string trimmed to 2 centimeters.  Remainder string given to patient to feel for identification.  Tenaculum removed.  No bleeding noted.  Speculum removed.  Uterus palpated normal.  Patient tolerated procedure well.  IUD Lot #:[id number].  Exp: 4/18.  Package information attached to consent and scanned into EPIC.  A:Removal of Mirena IUD and Reinsertion of  Mirena   P: Return to office 6 weeks for recheck Pt knows IUD needs to be replaced approximately 5 years. Instructions provided. 626.2

## 2014-05-10 ENCOUNTER — Encounter: Payer: Self-pay | Admitting: Obstetrics & Gynecology

## 2014-05-10 DIAGNOSIS — N809 Endometriosis, unspecified: Secondary | ICD-10-CM | POA: Insufficient documentation

## 2014-05-10 DIAGNOSIS — N920 Excessive and frequent menstruation with regular cycle: Secondary | ICD-10-CM | POA: Insufficient documentation

## 2014-06-18 ENCOUNTER — Ambulatory Visit (INDEPENDENT_AMBULATORY_CARE_PROVIDER_SITE_OTHER): Payer: 59 | Admitting: Obstetrics & Gynecology

## 2014-06-18 VITALS — BP 106/74 | HR 72 | Ht 66.0 in | Wt 166.6 lb

## 2014-06-18 DIAGNOSIS — Z975 Presence of (intrauterine) contraceptive device: Secondary | ICD-10-CM

## 2014-06-18 DIAGNOSIS — F39 Unspecified mood [affective] disorder: Secondary | ICD-10-CM

## 2014-06-18 DIAGNOSIS — R4586 Emotional lability: Secondary | ICD-10-CM

## 2014-06-18 MED ORDER — VILAZODONE HCL 10 MG PO TABS: ORAL_TABLET | ORAL | Status: DC

## 2014-06-18 NOTE — Progress Notes (Signed)
Subjective:     Patient ID: Margaret Ford, female   DOB: Jun 06, 1976, 38 y.o.   MRN: [id number]  HPI 24 you G2P1 DWF here for IUD recheck.  IUD placed 05/07/14.  Reports has not had any vaginal bleeding.  Also, migraines have subsided.  Denies pelvic pain.  Feels IUD is working well, from that standpoint.  Issue is now that she is feeling very "hormonal" and "moody".  Doesn't feel like anything in personal life has changed.  There are stressors but these haven't changed.  Took Viibryd in the past with good success with separation and divorce.  She would like to consider restarting this.  No suicidal or homicidal ideation.  Just not feeling emotionally "herself".  Review of Systems  All other systems reviewed and are negative.      Objective:   Physical Exam  Constitutional: She appears well-developed and well-nourished.  Abdominal: Soft. Bowel sounds are normal.  Genitourinary: Vagina normal and uterus normal. There is no rash, tenderness or lesion on the right labia. There is no rash or lesion on the left labia. Right adnexum displays no mass, no tenderness and no fullness. Left adnexum displays no mass, no tenderness and no fullness.  2cm IUD string noted at cervical os.  Lymphadenopathy:       Right: No inguinal adenopathy present.       Left: No inguinal adenopathy present.  Skin: Skin is warm and dry.  Psychiatric: She has a normal mood and affect.       Assessment:     Moodiness, mild depression IUD recheck     Plan:     Pt will restart Viibryd 10mg  daily x 7 days, then increase to 20mg  daily.  rx to pharmacy. Pt will give me a report in a month.  She knows to call if symptoms worsen  ~15 minutes spent with patient >50% of time was in face to face discussion of above.

## 2014-06-24 ENCOUNTER — Encounter: Payer: Self-pay | Admitting: Obstetrics & Gynecology

## 2014-06-24 DIAGNOSIS — Z975 Presence of (intrauterine) contraceptive device: Secondary | ICD-10-CM | POA: Insufficient documentation

## 2014-10-15 ENCOUNTER — Ambulatory Visit (INDEPENDENT_AMBULATORY_CARE_PROVIDER_SITE_OTHER): Payer: 59 | Admitting: Obstetrics & Gynecology

## 2014-10-15 ENCOUNTER — Encounter: Payer: Self-pay | Admitting: Obstetrics & Gynecology

## 2014-10-15 VITALS — BP 110/62 | HR 72 | Resp 16 | Ht 65.75 in | Wt 159.8 lb

## 2014-10-15 DIAGNOSIS — Z0000 Encounter for general adult medical examination without abnormal findings: Secondary | ICD-10-CM

## 2014-10-15 DIAGNOSIS — Z8041 Family history of malignant neoplasm of ovary: Secondary | ICD-10-CM | POA: Diagnosis not present

## 2014-10-15 DIAGNOSIS — Z01419 Encounter for gynecological examination (general) (routine) without abnormal findings: Secondary | ICD-10-CM | POA: Diagnosis not present

## 2014-10-15 DIAGNOSIS — Z202 Contact with and (suspected) exposure to infections with a predominantly sexual mode of transmission: Secondary | ICD-10-CM

## 2014-10-15 DIAGNOSIS — Z124 Encounter for screening for malignant neoplasm of cervix: Secondary | ICD-10-CM

## 2014-10-15 LAB — POCT URINALYSIS DIPSTICK
BILIRUBIN UA: NEGATIVE
Blood, UA: NEGATIVE
GLUCOSE UA: NEGATIVE
Ketones, UA: NEGATIVE
Leukocytes, UA: NEGATIVE
Nitrite, UA: NEGATIVE
Protein, UA: NEGATIVE
Urobilinogen, UA: NEGATIVE
pH, UA: 5

## 2014-10-15 NOTE — Progress Notes (Signed)
39 y.o. G2P1 DivorcedCaucasianF here for annual exam.  Leaving for [place] tomorrow.  Will stop in [place] when she returns.  Desires STD testing.  Has talked with Dr. Minna Antis about her Viibryd.  Boyfriend has been cheating on her.  She feels she needs something different than the Viibryd.  Father also passed due to pulmonary fibrosis.  This was the end of December.  Sister was stealing from her father and making him feel guilty about her helping with his care.  Lots of stress with his estate.    Not having any bleeding due to IUD.    No LMP recorded. Patient is not currently having periods (Reason: IUD).          Sexually active: No.  The current method of family planning is tubal ligation and IUD.    Exercising: Yes.    jogging 2-3 x weekly Smoker:  no  Health Maintenance: Pap:  10/02/13 WNL History of abnormal Pap:  yes MMG: 08/08/10-MMG, 7/12-6 month follow up-normal  Colonoscopy:  none BMD:   none TDaP:  08/30/11 Screening Labs: Dr. Minna Antis, Hb today: pending, Urine today: negative   reports that she has never smoked. She has never used smokeless tobacco. She reports that she drinks alcohol. She reports that she does not use illicit drugs.  Past Medical History  Diagnosis Date  . Congenital hearing loss     started age 34  . Endometriosis 10/10  . Hx of migraines     increased frequency with estrogen containing OCPs  . Abnormal Pap smear     history of colposcopy ECC with atypical squamous metaplasia    Past Surgical History  Procedure Laterality Date  . Implantation of envoy esteem hearing device      for hearing loss  . Breast reduction surgery  9/10  . Tubal ligation  10/10    cautery of endometriosis    Current Outpatient Prescriptions  Medication Sig Dispense Refill  . Cholecalciferol (VITAMIN D PO) Take 2,000 Int'l Units by mouth daily.    Marland Kitchen CIPRODEX otic suspension     . clonazePAM (KLONOPIN) 1 MG tablet Take 1 mg by mouth at bedtime as needed for anxiety.    .  cyclobenzaprine (FLEXERIL) 10 MG tablet Take 1 tablet (10 mg total) by mouth 3 (three) times daily as needed for muscle spasms. 30 tablet 1  . ELDERBERRY PO Take by mouth daily.    Marland Kitchen Fexofenadine HCl (ALLEGRA PO) Take by mouth as needed.    . Levonorgestrel (MIRENA IU) by Intrauterine route.    . Multiple Vitamins-Minerals (MULTIVITAMIN GUMMIES ADULT PO) Take by mouth daily. Super food    . PROAIR HFA 108 (90 BASE) MCG/ACT inhaler     . Vilazodone HCl (VIIBRYD) 10 MG TABS 1 tablet daily (10mg ) for 7 days then increase to 2 tablets daily (20mg ) (Patient taking differently: 20 mg. 1 tablet daily (10mg ) for 7 days then increase to 2 tablets daily (20mg )) 53 tablet 0   No current facility-administered medications for this visit.    Family History  Problem Relation Age of Onset  . Deafness Mother   . Diabetes Mother   . Cancer Mother 74    ovarian  . Heart Problems Mother     tachycardia  . Deafness Father   . Hypertension Father   . Diabetes Father   . Down syndrome Sister   . Diabetes Paternal Grandmother   . Cirrhosis Father     non alcoholic  ROS:  Pertinent items are noted in HPI.  Otherwise, a comprehensive ROS was negative.  Exam:   BP 110/62 mmHg  Pulse 72  Resp 16  Ht 5' 5.75" (1.67 m)  Wt 159 lb 12.8 oz (72.485 kg)  BMI 25.99 kg/m2  Weight change: +7#  Height: 5' 5.75" (167 cm)  Ht Readings from Last 3 Encounters:  10/15/14 5' 5.75" (1.67 m)  06/18/14 5\' 6"  (1.676 m)  05/07/14 5\' 6"  (1.676 m)    General appearance: alert, cooperative and appears stated age Head: Normocephalic, without obvious abnormality, atraumatic Neck: no adenopathy, supple, symmetrical, trachea midline and thyroid normal to inspection and palpation Lungs: clear to auscultation bilaterally Breasts: normal appearance, no masses or tenderness Heart: regular rate and rhythm Abdomen: soft, non-tender; bowel sounds normal; no masses,  no organomegaly Extremities: extremities normal, atraumatic,  no cyanosis or edema Skin: Skin color, texture, turgor normal. No rashes or lesions Lymph nodes: Cervical, supraclavicular, and axillary nodes normal. No abnormal inguinal nodes palpated Neurologic: Grossly normal   Pelvic: External genitalia:  no lesions              Urethra:  normal appearing urethra with no masses, tenderness or lesions              Bartholins and Skenes: normal                 Vagina: normal appearing vagina with normal color and discharge, no lesions              Cervix: no lesions and IUD string noted              Pap taken: Yes.   Bimanual Exam:  Uterus:  normal size, contour, position, consistency, mobility, non-tender              Adnexa: normal adnexa and no mass, fullness, tenderness               Rectovaginal: Confirms               Anus:  normal sphincter tone, no lesions  Chaperone was present for exam.  A:  Well Woman with normal exam Family hx of ovarian cancer in mother (deceased) Father died 2023-06-24 due to pulmonary fibrosis Mirena IUD, second one placed 05/07/14 Endometriosis diagnosed with laparoscopy 10/10 with BTL  P: mammogram starting age 29 pap smear only today. Neg HR HPV 3/13 GC/Chl today.  HIV, RPR, Hep B. Doing yearly U/S and ca-125. Ca-125 today.  PUS will be done in 6 months.  Order placed.   return annually or prn

## 2014-10-16 LAB — STD PANEL
HIV: NONREACTIVE
Hepatitis B Surface Ag: NEGATIVE

## 2014-10-16 LAB — CA 125: CA 125: 22 U/mL (ref <35)

## 2014-10-18 ENCOUNTER — Telehealth: Payer: Self-pay | Admitting: Obstetrics & Gynecology

## 2014-10-18 LAB — HEMOGLOBIN, FINGERSTICK: HEMOGLOBIN, FINGERSTICK: 14 g/dL (ref 12.0–16.0)

## 2014-10-18 LAB — IPS PAP TEST WITH REFLEX TO HPV

## 2014-10-18 NOTE — Telephone Encounter (Signed)
Patient returned call. I advised of benefit quote received for PUS. Patient agreeable. Scheduled PUS. Advised patient that I would re-check benefits closer to appt date. Patient agreeable. Advised patient of 72 hour cancellation policy and $142 cancellation fee. Patient agreeable.

## 2014-10-18 NOTE — Telephone Encounter (Signed)
Left message for patient to call back. Need to go over benefits and schedule PUS °

## 2014-10-19 LAB — IPS N GONORRHOEA AND CHLAMYDIA BY PCR

## 2014-12-10 ENCOUNTER — Other Ambulatory Visit: Payer: Self-pay | Admitting: Otolaryngology

## 2014-12-10 DIAGNOSIS — H903 Sensorineural hearing loss, bilateral: Secondary | ICD-10-CM

## 2014-12-15 ENCOUNTER — Ambulatory Visit
Admission: RE | Admit: 2014-12-15 | Discharge: 2014-12-15 | Disposition: A | Discharge status: Home / Self Care | Payer: 59 | Source: Ambulatory Visit | Attending: Otolaryngology | Admitting: Otolaryngology

## 2014-12-15 ENCOUNTER — Other Ambulatory Visit: Payer: Self-pay | Admitting: Otolaryngology

## 2014-12-15 ENCOUNTER — Other Ambulatory Visit: Payer: Self-pay

## 2014-12-15 DIAGNOSIS — H905 Unspecified sensorineural hearing loss: Secondary | ICD-10-CM

## 2014-12-28 ENCOUNTER — Telehealth: Payer: Self-pay | Admitting: Emergency Medicine

## 2014-12-28 ENCOUNTER — Encounter: Payer: Self-pay | Admitting: Obstetrics & Gynecology

## 2014-12-28 NOTE — Telephone Encounter (Signed)
Telephone call for triage created to discuss message with patient and disposition as appropriate.   

## 2014-12-28 NOTE — Telephone Encounter (Signed)
Call to patient regarding mychart message.  Patient denies symptoms.  Has Mirena IUD and BTL so not concerned about unintended pregnancy.   Scheduled office visit for STD check with Milford Cage, FNP  For 12/29/14.  Patient agreeable.   Routing to provider for final review. Patient agreeable to disposition. Will close encounter.    .   Cc Dr. Sabra Heck

## 2014-12-28 NOTE — Telephone Encounter (Signed)
Chief Complaint  Patient presents with  . Appointment    Patient sent mychart message that requires clinical triage   . Advice Only    ===View-only below this line===   ----- Message -----    FromVivien Rota    Sent: 12/28/2014  2:56 PM EDT      To: Lyman Speller, MD Subject: Non-Urgent Medical Question  Dr Sabra Heck,  I'd like to come in for a check.  I had an encounter with someone and the condom broke and I'm a worrier so what do I need to do?  He says he's clean but...  Thanks.

## 2014-12-29 ENCOUNTER — Encounter: Payer: Self-pay | Admitting: Nurse Practitioner

## 2014-12-29 ENCOUNTER — Ambulatory Visit (INDEPENDENT_AMBULATORY_CARE_PROVIDER_SITE_OTHER): Payer: 59 | Admitting: Nurse Practitioner

## 2014-12-29 VITALS — BP 108/72 | Resp 16 | Ht 65.75 in | Wt 167.4 lb

## 2014-12-29 DIAGNOSIS — Z113 Encounter for screening for infections with a predominantly sexual mode of transmission: Secondary | ICD-10-CM

## 2014-12-29 NOTE — Patient Instructions (Signed)

## 2014-12-29 NOTE — Progress Notes (Signed)
  11 yrs Divorced  White female G2P1 here for questionable STD exposure.  Last sexual encounter with a new partner about 2 weeks ago with condom failure.  He has no known history of STD's and 'clean'.   No LMP recorded. Patient is not currently having periods (Reason: IUD).  Mirena IUD was placed 06/2014 for treatment of endometriosis.  Also has BTL.                                                                                                                                                                                             Patient:She denies abnormal vaginal bleeding, discharge or unusual pelvic pain, no dysuria, frequency or hematuria. Denies new personal products.                                           General:Healthy female WDWN Affect: normal, orientation X 3  Abdomen: soft non-tender Lymph groin:Normal   Pelvic Exam:EGBUS normal. Vulva reveals no erythema, lesions or atrophic changes. Vagina normal, no lesions, polyps or discharge.  Cervix is normal, smooth pink mucosa, no friability, non tender, no CMT, no lesions, no polyps. IUD string is visible. Uterusnormal:  Adnexa:  findings; not tender Perineal area: no lesions noted Rectal: no lesions noted Specimens collected for Affirm, GC and Chlamydia                   Assessment: Normal Pelvic Exam                                     Contraception: BTL and IUD STD Screening     Plan :Reviewed test and when to expect results GC,Chl,HIV RPR, Hepatitis B, and Affirm          STD prevention reviewed.  Questions addressed.         Repeat  STD screen at next A[facility]  Instructions given to patient             RV

## 2014-12-30 LAB — STD PANEL
HIV 1&2 Ab, 4th Generation: NONREACTIVE
Hepatitis B Surface Ag: NEGATIVE

## 2014-12-30 LAB — WET PREP BY MOLECULAR PROBE
Candida species: NEGATIVE
Gardnerella vaginalis: NEGATIVE
TRICHOMONAS VAG: NEGATIVE

## 2015-01-01 NOTE — Progress Notes (Signed)
Encounter reviewed by Dr. Rosemarie Galvis Silva.  

## 2015-01-03 LAB — IPS N GONORRHOEA AND CHLAMYDIA BY PCR

## 2015-04-12 ENCOUNTER — Encounter: Payer: Self-pay | Admitting: Obstetrics & Gynecology

## 2015-04-14 ENCOUNTER — Other Ambulatory Visit: Payer: 59 | Admitting: Obstetrics & Gynecology

## 2015-04-14 ENCOUNTER — Other Ambulatory Visit: Payer: 59

## 2015-04-14 ENCOUNTER — Ambulatory Visit (INDEPENDENT_AMBULATORY_CARE_PROVIDER_SITE_OTHER): Payer: 59

## 2015-04-14 ENCOUNTER — Ambulatory Visit (INDEPENDENT_AMBULATORY_CARE_PROVIDER_SITE_OTHER): Payer: 59 | Admitting: Obstetrics & Gynecology

## 2015-04-14 DIAGNOSIS — Z9621 Cochlear implant status: Secondary | ICD-10-CM

## 2015-04-14 DIAGNOSIS — N83201 Unspecified ovarian cyst, right side: Secondary | ICD-10-CM

## 2015-04-14 DIAGNOSIS — Z8041 Family history of malignant neoplasm of ovary: Secondary | ICD-10-CM | POA: Diagnosis not present

## 2015-04-14 DIAGNOSIS — N809 Endometriosis, unspecified: Secondary | ICD-10-CM

## 2015-04-14 NOTE — Progress Notes (Signed)
39 y.o. Divorcedfemale here for a pelvic ultrasound due to family hx of ovarian cancer.  Pt's mother died of ovarian cancer.  Pt and I have discussed hysterectomy several times as she also has endometriosis.  Pt is well controlled with a Mirena IUD however her IUD needed to be removed and replaced earlier than five years.  I felt this was due to slightly decreasing progesterone levels and increased vaginal bleeding.  She experienced increase pelvic pain as a result.  Pt, and I, have been hoping to get close enough to 49 that when she proceeds with a hysterectomy, she can also proceed with BSO at the same time.  Pt has recently been informed that the progesterone in her IUD may be contributing to decreased function in her cochlear implants.  Her implants were placed after her first Mirena IUD and worked well.  Only recently, has there been an decrease in function of one of her implants.  This doesn't seem consistent with the Mirena IUD being the cause of the change in implant functioning.   However, she and I discussed the possibility of removal of the IUD for a short period of time.  If the implant functioning improves, then she know hysterectomy would be the best options.  If it didn't improve, she could always choose to replace the IUD.   She is not sure what she wants to do but will consider her options.   No LMP recorded. Patient is not currently having periods (Reason: IUD).  Sexually active:  yes  Contraception: IUD  FINDINGS: UTERUS: 8.5 x 4.9 x 3.7cm with IUD in correct location EMS: 6.67mm ADNEXA:   Left ovary 2.5 x 1.7 x 1.0cm   Right ovary 2.7 x 3.0 x 2.2cm with 1.3cm collapsing corpus luteal cyst CUL DE SAC: no free fluid  Findings reviewed.  Pt currently not sure what she wants to do regarding her IUD use and possible association with decreased cochlear implant function.    Assessment:  Family hx of ovarian cancer with hx of negative BRCA 1/2 testing in pt 1.3cm collapsing right  corpus luteal cyst Mirena IUD use with IUD possibly causing decrease in function of cochlear implant H/o endometriosis  Plan: Pt will consider options and called back if she decides to have IUD removed to see if hearing improves with improved cochlear implant function or if she just decides to proceed with hysterectomy at this time.  Would also proceed with bilateral salpingectomy as well.  Pt states if she decides to proceed with surgery, she is comfortable with this plan.  ~30 minutes spent with patient >50% of time was in face to face discussion of above.

## 2015-04-17 ENCOUNTER — Encounter: Payer: Self-pay | Admitting: Obstetrics & Gynecology

## 2015-04-17 DIAGNOSIS — Z9621 Cochlear implant status: Secondary | ICD-10-CM | POA: Insufficient documentation

## 2015-04-20 ENCOUNTER — Encounter: Payer: Self-pay | Admitting: Obstetrics & Gynecology

## 2015-04-21 ENCOUNTER — Telehealth: Payer: Self-pay | Admitting: Emergency Medicine

## 2015-04-21 DIAGNOSIS — Z30432 Encounter for removal of intrauterine contraceptive device: Secondary | ICD-10-CM

## 2015-04-21 NOTE — Telephone Encounter (Signed)
Telephone call for triage created to discuss message with patient and disposition as appropriate.   

## 2015-04-21 NOTE — Telephone Encounter (Signed)
Chief Complaint  Patient presents with  . Appointment    Patient sent mychart message with request for IUD removal.      From [facility]   To Megan Salon, MD   Sent 04/20/2015 10:10 AM   I would like to schedule the removal of the IUD week of November 14th or after if you have any availability.    Thanks!  Margaret Ford

## 2015-04-21 NOTE — Telephone Encounter (Signed)
Order placed for IUD removal.  Message left to return call to [place] at [phone].

## 2015-04-21 NOTE — Telephone Encounter (Signed)
Patient returned call and is scheduled for IUD removal 05/24/15 at 1530 with Dr. Sabra Heck.  Advised would be contacted with insurance coverage for removal. cc Kerry Hough for insurance pre-certification and patient contact.  Routing to Seana Underwood for final review. Patient agreeable to disposition. Will close encounter.

## 2015-05-24 ENCOUNTER — Ambulatory Visit (INDEPENDENT_AMBULATORY_CARE_PROVIDER_SITE_OTHER): Payer: 59 | Admitting: Obstetrics & Gynecology

## 2015-05-24 VITALS — BP 108/76 | HR 68 | Resp 16 | Wt 172.0 lb

## 2015-05-24 DIAGNOSIS — Z30432 Encounter for removal of intrauterine contraceptive device: Secondary | ICD-10-CM

## 2015-05-24 DIAGNOSIS — N809 Endometriosis, unspecified: Secondary | ICD-10-CM | POA: Diagnosis not present

## 2015-05-24 DIAGNOSIS — H9193 Unspecified hearing loss, bilateral: Secondary | ICD-10-CM

## 2015-05-29 DIAGNOSIS — H9190 Unspecified hearing loss, unspecified ear: Secondary | ICD-10-CM | POA: Insufficient documentation

## 2015-05-29 NOTE — Progress Notes (Signed)
61 yrs G4P1 Divorced Caucasian female for IUD removal.  Pt has cochlear implant and has been told by her audiologist that the progesterone from her IUD may be interfering with the implant functioning correctly and, therefore, worsening her hearing.  She had recent change in her hearing with one of the implants that does not seem to me (from a timing perspective) to be related.  However, pt has considered this and has decided to proceed with removal.  Pt has known hx of endometriosis and family hx (mother) of ovarian cancer.  If her hearing improves but her pain worsens, she is going to consider proceeding with hysterectomy.  She is not at that point yet so we will just need to wait and see what happens to both her hearing and to her pain/dysmenorrhea.  Currently, she is having minimal bleeding and no pelvic pain.  Denies vaginal discharge.   ROS:  13 point ROS questions were negative  Exam: Gen:  Alert, Oriented, NAD Groin:no inguinal nodes palpated    Pelvic exam: VULVA: normal appearing vulva with no masses, tenderness or lesions VAGINA: normal appearing vagina with normal color and discharge, no lesions CERVIX: normal appearing cervix without discharge or lesions, 2cm IUD string noted  Procedure: Speculum placed, cervix visualized.  IUD string visualized, grasp with ring forceps, with gentle traction IUD removed intact.  IUD shown to patient and discarded. Speculum removed. Pt tolerated procedure well.  UTERUS: uterus is normal size, shape, consistency and nontender, ADNEXA: normal adnexa in size, nontender and no masses.  Assessment: H/O hearing change with questionable relatinship to her Mirena IUD S/P removal today of Mirena IUD  Plan:  Pt has BTL for contraception so does not need anything else She is going to watch symptoms and call if she feels she needs to discuss other options or if hearing does not change and she would like and IUD placed again.

## 2015-06-16 ENCOUNTER — Telehealth: Payer: Self-pay | Admitting: *Deleted

## 2015-06-16 ENCOUNTER — Encounter: Payer: Self-pay | Admitting: Obstetrics & Gynecology

## 2015-06-16 NOTE — Telephone Encounter (Signed)
Chief Complaint  Patient presents with  . Advice Only    Patient sent My Chart message   [name], I am sorry that removing the IUD did not have the benefits you had hoped for. You will need to come in for an office visit to discuss options and alternatives with Dr Sabra Heck. This is not something that can be discussed sufficiently through electronic communication. Please call the office to schedule an appointment. We have appointments as early as Monday of next week.  Thank you, Lamont Snowball, RN Clinical Supervisor    ===View-only below this line===   ----- Message -----    From: Margaret Ford    Sent: 06/16/2015  8:52 AM EST      To: Lyman Speller, MD Subject: Non-Urgent Medical Question  Okay. I had a period for one day this week.  It was accompanied by pain of course and my hearing in my cochlear implanted ear went completely wacky.  I asked other women that have cochlears if this is normal and some said yes, when they got their cycle that this happened.  So..is there a really low dose birth control that I can take that won't give me migraines- estrogen allergy -that would prevent my cycles without having to do the hysterectomy.   Just trying to explore all options before doing any kind of surgery.   Margaret Ford my daughter is on some kind now.  She too had a estrogen allergy.   Thoughts?

## 2015-06-16 NOTE — Telephone Encounter (Signed)
Routing to provider for final review. Will close encounter.     

## 2015-06-16 NOTE — Telephone Encounter (Signed)
Patient scheduled for Monday, 06/20/15, with Dr. Sabra Heck for a consultation. Routing to [employer] for [employer].

## 2015-06-20 ENCOUNTER — Encounter: Payer: Self-pay | Admitting: Obstetrics & Gynecology

## 2015-06-20 ENCOUNTER — Ambulatory Visit (INDEPENDENT_AMBULATORY_CARE_PROVIDER_SITE_OTHER): Payer: 59 | Admitting: Obstetrics & Gynecology

## 2015-06-20 VITALS — BP 110/60 | HR 84 | Resp 16 | Ht 65.75 in | Wt 172.0 lb

## 2015-06-20 DIAGNOSIS — Z8041 Family history of malignant neoplasm of ovary: Secondary | ICD-10-CM | POA: Diagnosis not present

## 2015-06-20 DIAGNOSIS — R102 Pelvic and perineal pain unspecified side: Secondary | ICD-10-CM

## 2015-06-20 DIAGNOSIS — N809 Endometriosis, unspecified: Secondary | ICD-10-CM

## 2015-06-20 DIAGNOSIS — N921 Excessive and frequent menstruation with irregular cycle: Secondary | ICD-10-CM

## 2015-06-20 DIAGNOSIS — Z9621 Cochlear implant status: Secondary | ICD-10-CM | POA: Diagnosis not present

## 2015-06-20 NOTE — Progress Notes (Signed)
GYNECOLOGY  VISIT   HPI: 39 y.o. Divorced Caucasian female G2P1 here to discuss change in cycles since her Mirena IUD was removed.  Pt had cochlear implant placed this year and had some hearing changes that her audiologist thought could be attributed to the Mirena IUD and progesterone.  Although, I felt this was less likely, pt ultimately desired IUD removal just to be sure.  Since that time, she's noticed with any bleeding episodes that her hearing changes (for the worse) significantly.  For example, pt reports last Tuesday she was very sensitive to background noise.  She changed the settings/program on her cochlear implant and was able hear in a program where she typically cannot hear.  She started to spot that day and then sent a message to her audiologist as well as to a group of women she's in a facebook group with and asked if any of them have issues with their hearing related to cycles.  Several of them informed her yes, they do have issues with cycles and changes in her hearing.    Then she states that the spotting lasted about 24 hours and stopped.  After that time, her hearing normalized.  However, her hearing changed again on Sunday right before her actual cycle started.  She does not want to be on OCPs.  She's had a BTL.  She's okay if her hearing is less all of the time with the IUD but this up and down with being able to hear and then not being able to hear is just making her feel crazy.  Also, she's started to have increased pelvic pain as well and she doesn't want this to worsen.  She's decided that she wants her Mirena IUD replaced.  Wanted to see if there was any problem with trying to get that done this calendar year.  Pt does have  BTL so does not need to wait until a cycle for placement.  Reviewed OCPs, oral progestins, IM progestins as well.  She is not interested in any of these methods.  Patient's last menstrual period was 06/19/2015.    GYNECOLOGIC HISTORY: Patient's last  menstrual period was 06/19/2015. Contraception: BTl  Patient Active Problem List   Diagnosis Date Noted  . Hearing loss 05/29/2015  . 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 Endometriosis 05/10/2014  . Excessive or frequent menstruation 05/10/2014  . B12 DEFICIENCY 03/20/2007    Past Medical History  Diagnosis Date  . Congenital hearing loss     started age 9  . Endometriosis 10/10  . Hx of migraines     increased frequency with estrogen containing OCPs  . Abnormal Pap smear     history of colposcopy ECC with atypical squamous metaplasia    Past Surgical History  Procedure Laterality Date  . Implantation of envoy esteem hearing device      for hearing loss  . Breast reduction surgery  9/10  . Tubal ligation  10/10    cautery of endometriosis    Current Outpatient Prescriptions  Medication Sig Dispense Refill  . Cholecalciferol (VITAMIN D PO) Take 2,000 Int'l Units by mouth daily.    Marland Kitchen ELDERBERRY PO Take by mouth daily.    Marland Kitchen Fexofenadine HCl (ALLEGRA PO) Take by mouth as needed.    . Multiple Vitamins-Minerals (MULTIVITAMIN GUMMIES ADULT PO) Take by mouth daily. Super food    . PROAIR HFA 108 (90 BASE) MCG/ACT inhaler      No current facility-administered medications for this visit.  ALLERGIES: Augmentin; Ciprofloxacin; Codeine; Shrimp; and Z-pak  SH:  Divorced, Non-smoker  Review of Systems  All other systems reviewed and are negative.   PHYSICAL EXAMINATION:    BP 110/60 mmHg  Pulse 84  Resp 16  Ht 5' 5.75" (1.67 m)  Wt 172 lb (78.019 kg)  BMI 27.97 kg/m2  LMP 06/19/2015    General appearance: alert, cooperative and appears stated age No other physical exam performed  Assessment: Endometriosis Pelvic pain Changes with cochlear implant in relation to cycles Family hx of ovarian cancer    Plan: Will check with insurance about IUD coverage and contact pt for OV for placement.  Pt states she may decide to proceed even if not covered this year  but would like to know, up front.  All questions answered.  ~15 minutes spent with patient >50% of time was in face to face discussion of above.

## 2015-06-21 ENCOUNTER — Encounter: Payer: Self-pay | Admitting: Obstetrics & Gynecology

## 2015-06-22 NOTE — Telephone Encounter (Signed)
Message to insurance coordinator to check benefits and call patient for appointment.

## 2015-06-22 NOTE — Telephone Encounter (Signed)
Patient called to check on the status of the request for benefits. Call back #: [phone].  Cc: Margaret Ford

## 2015-06-22 NOTE — Telephone Encounter (Signed)
Pt has had a tubal ligation. She can come in for IUD placement any day. This does not need to be in relation to her cycle. Thanks. Message from Dr. Sabra Heck on 06/20/15 with Mirena IUD order.

## 2015-06-23 ENCOUNTER — Telehealth: Payer: Self-pay | Admitting: Obstetrics & Gynecology

## 2015-06-23 NOTE — Telephone Encounter (Signed)
Spoke with patient. Reviewed benefit for iud insertion. Patient agreeable and ready to schedule. Patient scheduled for 06/24/15 @10am  with Dr Sabra Heck. Patient aware of cancellation policy with [id number] fee. Patient agreeable to arrival date/time. No further questions ok to close.

## 2015-06-23 NOTE — Telephone Encounter (Signed)
See phone note. Patient is scheduled for IUD insertion tomorrow. Encounter closed.

## 2015-06-24 ENCOUNTER — Encounter: Payer: Self-pay | Admitting: Obstetrics & Gynecology

## 2015-06-24 ENCOUNTER — Ambulatory Visit (INDEPENDENT_AMBULATORY_CARE_PROVIDER_SITE_OTHER): Payer: 59 | Admitting: Obstetrics & Gynecology

## 2015-06-24 VITALS — BP 108/70 | HR 78 | Resp 16 | Ht 65.75 in | Wt 171.0 lb

## 2015-06-24 DIAGNOSIS — N809 Endometriosis, unspecified: Secondary | ICD-10-CM

## 2015-06-24 DIAGNOSIS — N921 Excessive and frequent menstruation with irregular cycle: Secondary | ICD-10-CM

## 2015-06-24 NOTE — Progress Notes (Signed)
39 y.o. G2P1 Divorced Caucasian female presents for  insertion of Mirena. She has hx of endometriosis and recent cochlear implant that did not seem to work well and audiologist questioned was related to the IUD.  She had this removed but has realized that her IUD was really helping her pelvic pain and that with her menstrual cycling, she's having fluctuating issues with hearing related to the cochlear implant.  She'd rather have and IUD and better pain and bleeding control and deal with any hearing issues that are stable vs having fluctuations with hearing.   LMP:  Patient's last menstrual period was 06/19/2015.  Gen:  WNWF healthy female NAD Abdomen: soft, non-tender Groin:  no inguinal nodes palpated  Pelvic exam: Vulva:  normal Vagina:  normal vagina, no discharge, exudate, lesion, or erythema Cervix:  Non-tender, Negative CMT, no lesions or redness Uterus:  normal shape, position and consistency   After patient read information booklet and all questions were answered, informed consent was obtained.      Procedure:  Speculum inserted into vagina. Cervix visualized and cleansed with betadine solution X 3. Paracervical block placed:  no.   Tenaculum placed on cervix at 12 o'clock position.  Uterus sounded to 7.5 centimeters.  IUD and inserting device removed from sterile packet and under sterile conditions inserted to fundus of uterus.  IUD released and introducer removed without difficulty.  IUD string trimmed to 2 centimeters.  Remainder string given to patient to feel for identification.  Tenaculum removed.  Minimal bleeding noted.  Speculum removed.  Uterus palpated normal.  Patient tolerated procedure well.  IUD Lot #:[id number].  Exp: 6/19.  Package information attached to consent and scanned into EPIC.  A: Insertion of Mirena   P: Return to office 6-8 weeks      Pt knows IUD needs to be replaced approximately 5 years, no later than 06/23/2020.  Instructions provided.

## 2015-08-26 ENCOUNTER — Encounter: Payer: Self-pay | Admitting: Obstetrics & Gynecology

## 2015-08-26 ENCOUNTER — Ambulatory Visit (INDEPENDENT_AMBULATORY_CARE_PROVIDER_SITE_OTHER): Payer: 59 | Admitting: Obstetrics & Gynecology

## 2015-08-26 VITALS — BP 100/60 | HR 74 | Resp 16 | Ht 65.75 in | Wt 170.0 lb

## 2015-08-26 DIAGNOSIS — Z30431 Encounter for routine checking of intrauterine contraceptive device: Secondary | ICD-10-CM

## 2015-08-26 NOTE — Progress Notes (Signed)
GYNECOLOGY  VISIT   HPI: 40 y.o. G2P1 Divorced Caucasian female for IUD recheck.  Mirena IUD placed 06/24/15.  Pt reports she is still having a cycle but it is shorter and the pain is much better.  She is happy with the decrease in pain.  Doesn't want to have IUD removed.  Hearing is about the same and is only having issues with her hearing during her cycle.    Patient's last menstrual period was 08/15/2015.  This was eight days but really light.  Patient Active Problem List   Diagnosis Date Noted  . Hearing loss 05/29/2015  . 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 Endometriosis 05/10/2014  . Excessive or frequent menstruation 05/10/2014  . B12 DEFICIENCY 03/20/2007    Past Medical History  Diagnosis Date  . Congenital hearing loss     started age 80  . Endometriosis 10/10  . Hx of migraines     increased frequency with estrogen containing OCPs  . Abnormal Pap smear     history of colposcopy ECC with atypical squamous metaplasia    Past Surgical History  Procedure Laterality Date  . Implantation of envoy esteem hearing device      for hearing loss  . Breast reduction surgery  9/10  . Tubal ligation  10/10    cautery of endometriosis    MEDS:  Reviewed in EPIC and UTD  ALLERGIES: Augmentin; Ciprofloxacin; Codeine; Shrimp; and Z-pak  Family History  Problem Relation Age of Onset  . Deafness Mother   . Diabetes Mother   . Cancer Mother 51    ovarian  . Heart Problems Mother     tachycardia  . Deafness Father   . Hypertension Father   . Diabetes Father   . Down syndrome Sister   . Diabetes Paternal Grandmother   . Cirrhosis Father     non alcoholic    SH:  Divorced, non smoker  Review of Systems  All other systems reviewed and are negative.   PHYSICAL EXAMINATION:    BP 100/60 mmHg  Pulse 74  Resp 16  Ht 5' 5.75" (1.67 m)  Wt 170 lb (77.111 kg)  BMI 27.65 kg/m2  LMP 08/15/2015     General appearance: alert, cooperative and appears stated  age  Pelvic: External genitalia:  no lesions              Urethra:  normal appearing urethra with no masses, tenderness or lesions              Bartholins and Skenes: normal                 Vagina: normal appearing vagina with normal color and discharge, no lesions              Cervix: no lesions and IUD string noted              Bimanual Exam:  Uterus:  normal size, contour, position, consistency, mobility, non-tender              Adnexa: no mass, fullness, tenderness  Chaperone was present for exam.  Assessment: Mirena IUD placed 06/24/15  Plan: Follow up for AEX scheduled 6/17.

## 2015-08-30 IMAGING — CT CT TEMPORAL BONES W/O CM
1 series · 15 of 30 positions shown, 19 images · non-contrast
Comparison: CT of the temporal bones 08/15/2012

CLINICAL DATA: Decreased hearing and speech recognition at 10%.
History of cochlear implant 8007. Question migration.

EXAM:
CT TEMPORAL BONES WITHOUT CONTRAST
TECHNIQUE: Axial and coronal plane CT imaging of the petrous temporal bones was
performed with thin-collimation image reconstruction. No intravenous
contrast was administered. Multiplanar CT image reconstructions were
also generated.

[Series 4: temp bones 2.0 h41s · axial · 0.43mm/px · z∈[-101,-45]mm · 15 of 32 slices shown, 19 images]
[im 2/32  brain]
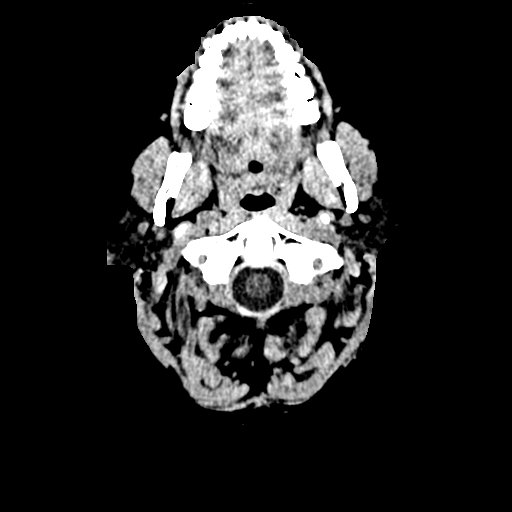
[im 2/32  bone]
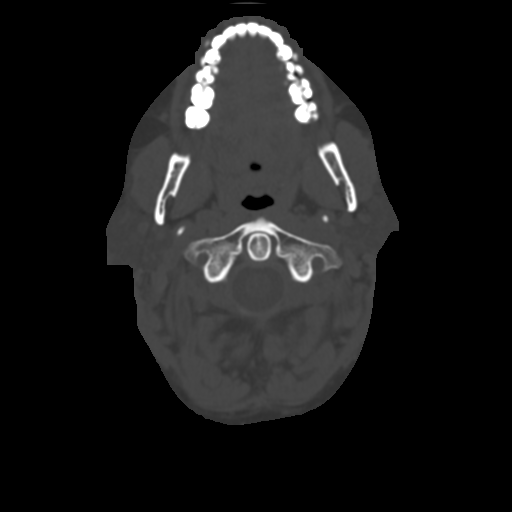
[im 4/32  bone]
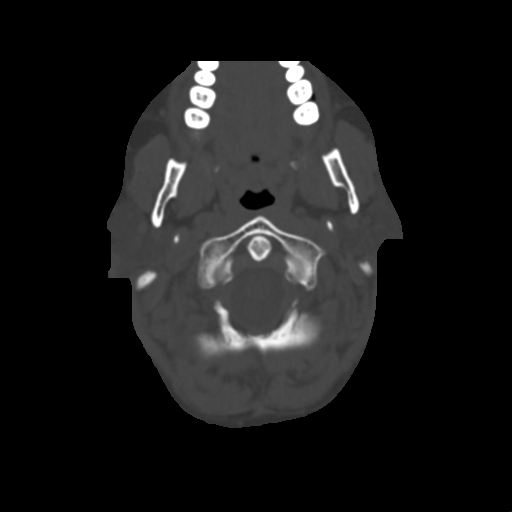
[im 6/32  bone]
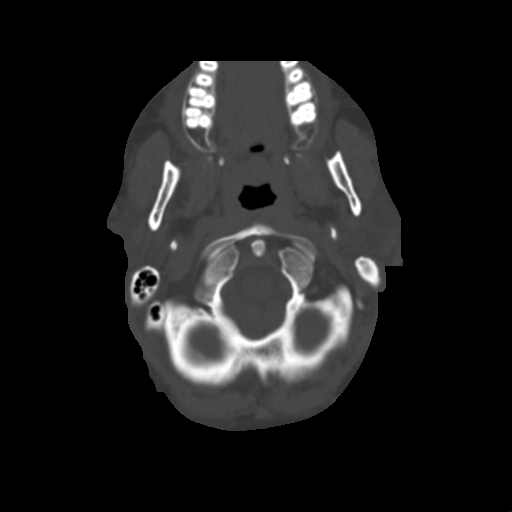
[im 8/32  bone]
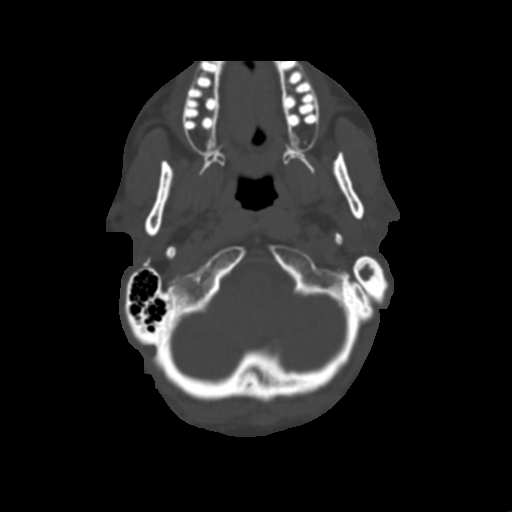
[im 10/32  brain]
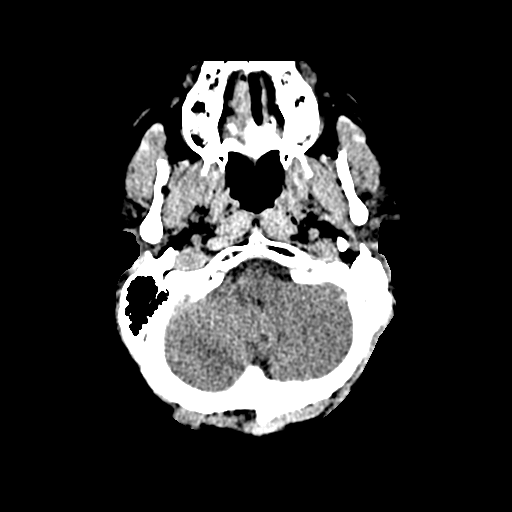
[im 10/32  bone]
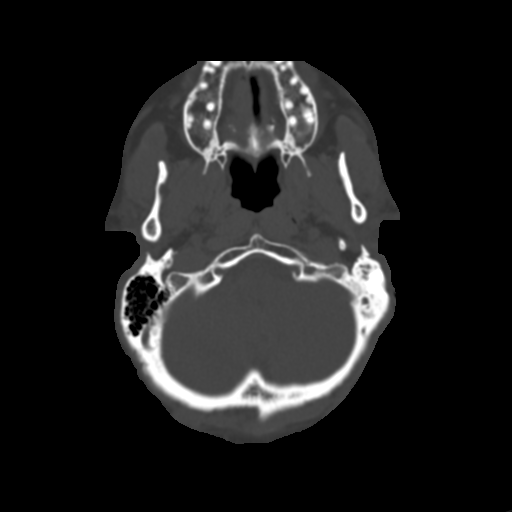
[im 12/32  bone]
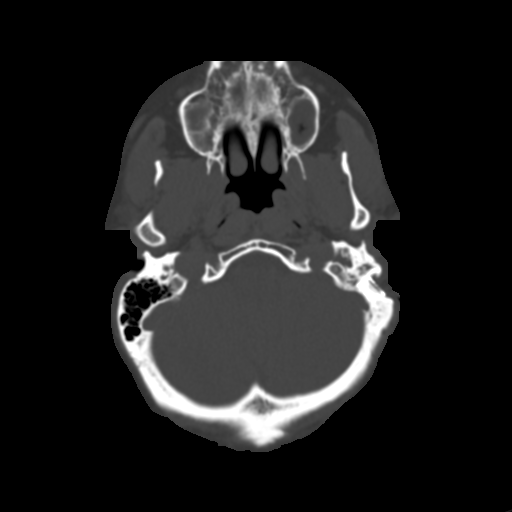
[im 14/32  bone]
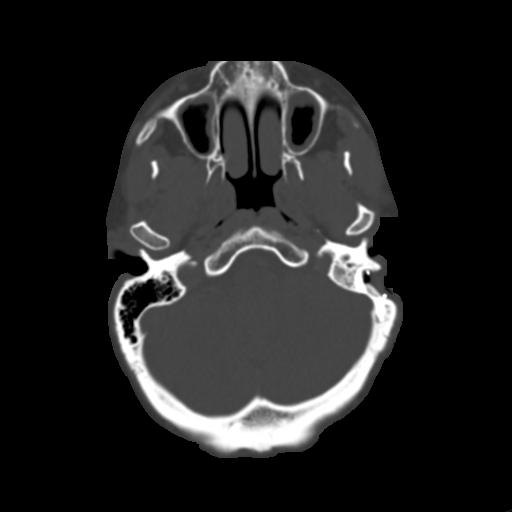
[im 17/32  bone]
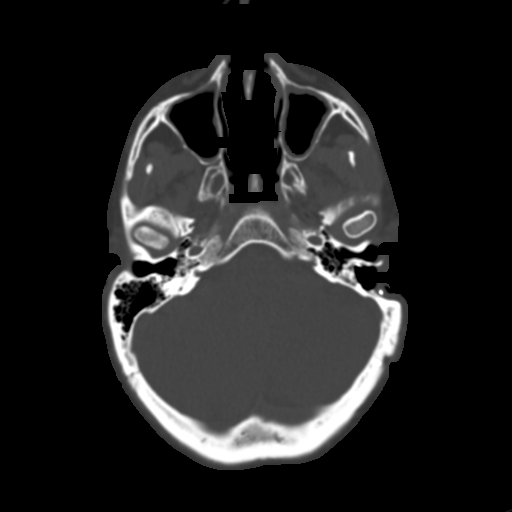
[im 18/32  brain]
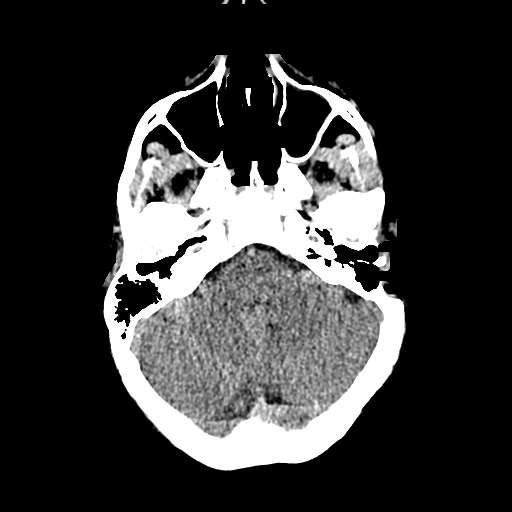
[im 18/32  bone]
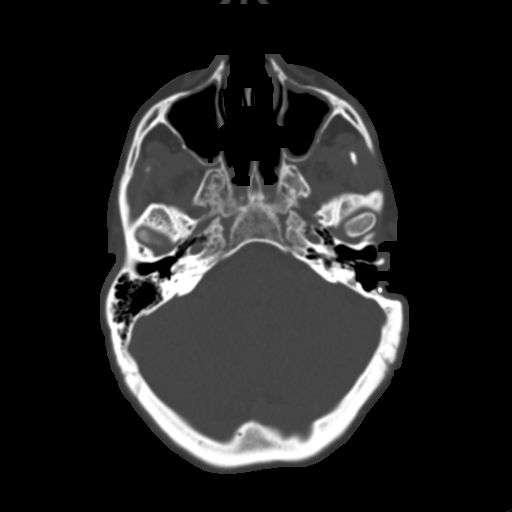
[im 20/32  bone]
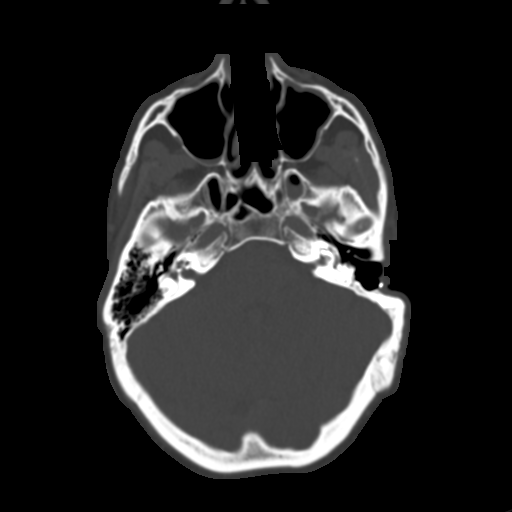
[im 22/32  bone]
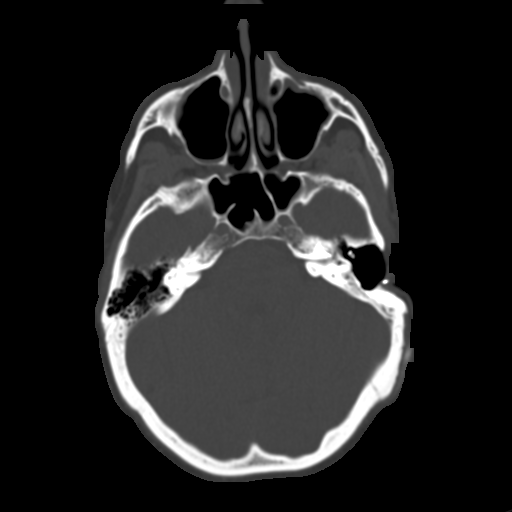
[im 24/32  bone]
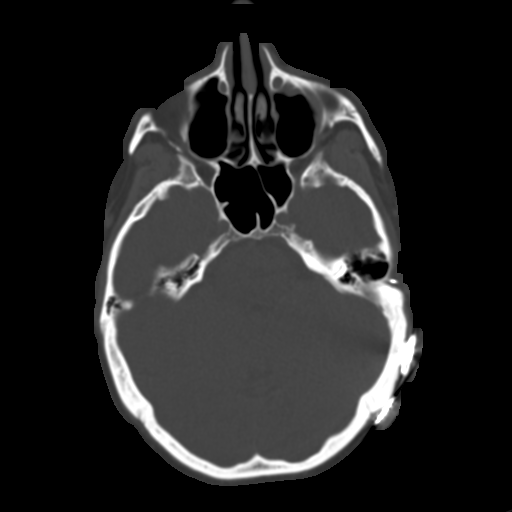
[im 26/32  brain]
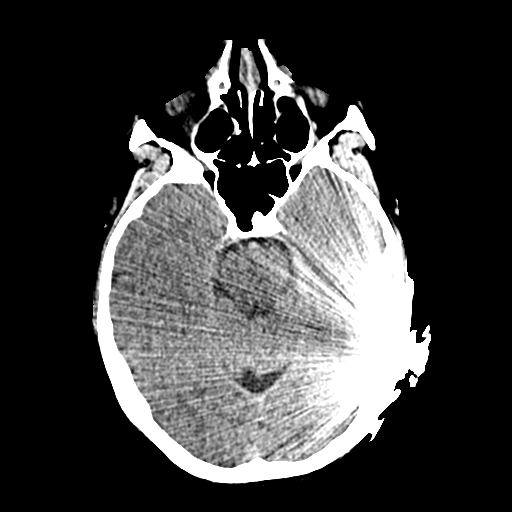
[im 26/32  bone]
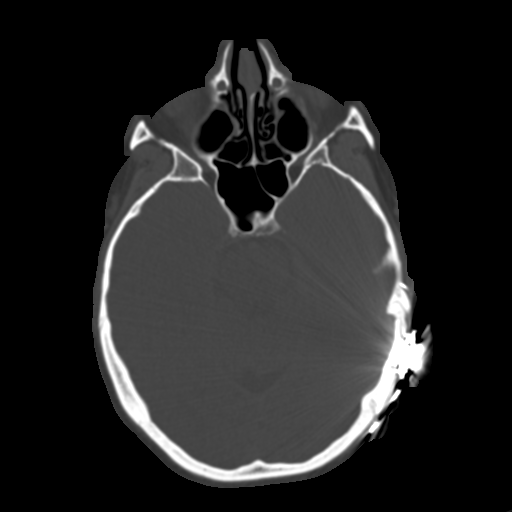
[im 28/32  bone]
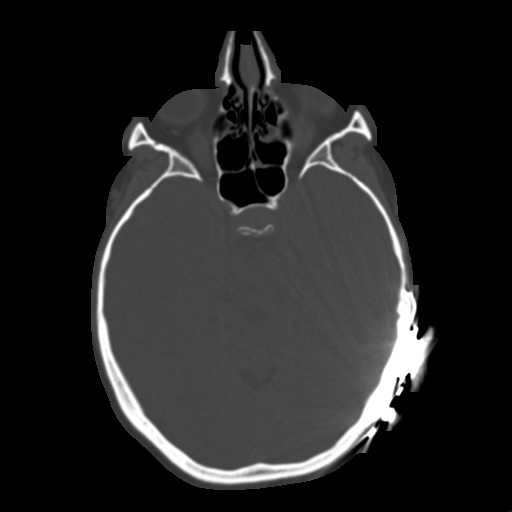
[im 30/32  bone]
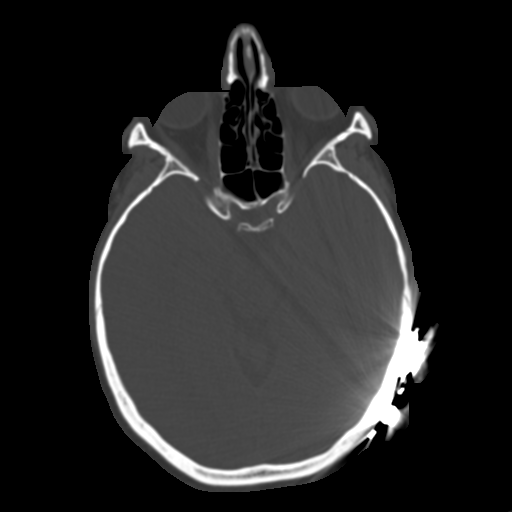

[15 of 30 positions shown; findings below may reference images not displayed]

FINDINGS: Limited imaging the brain is unremarkable. Circumferential mucosal
thickening is present in the maxillary sinuses bilaterally, left
greater than right. The paranasal sinuses are otherwise clear.

Dedicated imaging of the right temporal bone demonstrates a patent
right external auditory canal. The tympanic membrane is visualized
and normal. The middle ear ossicles are normally formed and
articulating. Low density within the otic capsule again suggests
retro fenestral otosclerosis. The cochlea and vestibular apparatus
are normally formed. The internal auditory canal and vestibular
aqueduct are normal.

A left-sided cochlear implant is noted. A wall up mastoidectomy is
noted. The middle ear ossicles are intact. Otosclerosis is
suggested. The cochlear implant lead makes 1 full rotation within
the cochlea. The wire is intact otherwise. There is some fluid in
the residual mastoid air cells. The internal auditory canal and
vestibular aqueduct are within normal limits.
IMPRESSION: 1. The lead of the left cochlear implant makes 1 full rotation
within the cochlea. The wire is intact.
2. Wall down mastoidectomy for cochlear implantation. The device has
been revised since [DATE]. Bilateral retro fenestral otosclerosis.

## 2015-11-09 ENCOUNTER — Telehealth: Payer: Self-pay | Admitting: Obstetrics & Gynecology

## 2015-11-09 NOTE — Telephone Encounter (Signed)
Left message for patient to call and reschedule her aex appointment 12/23/2015. Patient is due after 10/15/2015.

## 2015-11-28 ENCOUNTER — Encounter: Payer: Self-pay | Admitting: Obstetrics & Gynecology

## 2015-11-28 ENCOUNTER — Ambulatory Visit (INDEPENDENT_AMBULATORY_CARE_PROVIDER_SITE_OTHER): Payer: 59 | Admitting: Obstetrics & Gynecology

## 2015-11-28 VITALS — BP 110/70 | HR 80 | Resp 15 | Ht 65.25 in | Wt 169.0 lb

## 2015-11-28 DIAGNOSIS — Z8041 Family history of malignant neoplasm of ovary: Secondary | ICD-10-CM | POA: Diagnosis not present

## 2015-11-28 DIAGNOSIS — Z202 Contact with and (suspected) exposure to infections with a predominantly sexual mode of transmission: Secondary | ICD-10-CM

## 2015-11-28 NOTE — Progress Notes (Signed)
40 y.o. G2P1 DivorcedCaucasianF here for annual exam.  Doing well.  No vaginal bleeding since new IUD bleeding.  Last PUS was 04/24/15.    No LMP recorded. Patient is not currently having periods (Reason: IUD).          Sexually active: Yes.    The current method of family planning is tubal ligation/ IUD.    Exercising: Yes.    strength training Smoker:  no  Health Maintenance: Pap:  10-15-14 WNL  History of abnormal Pap:  Yes years ago  MMG:  04-20-15 WNL BI RADS 1 Colonoscopy:  Never BMD:   Never TDaP:  09-25-12 Screening Labs: PCP does labs    reports that she has never smoked. She has never used smokeless tobacco. She reports that she drinks alcohol. She reports that she does not use illicit drugs.  Past Medical History  Diagnosis Date  . Congenital hearing loss     started age 57  . Endometriosis 10/10  . Hx of migraines     increased frequency with estrogen containing OCPs  . Abnormal Pap smear     history of colposcopy ECC with atypical squamous metaplasia    Past Surgical History  Procedure Laterality Date  . Implantation of envoy esteem hearing device      for hearing loss  . Breast reduction surgery  9/10  . Tubal ligation  10/10    cautery of endometriosis    Current Outpatient Prescriptions  Medication Sig Dispense Refill  . Cholecalciferol (VITAMIN D PO) Take 2,000 Int'l Units by mouth daily.    Marland Kitchen ELDERBERRY PO Take by mouth daily.    . Multiple Vitamins-Minerals (MULTIVITAMIN GUMMIES ADULT PO) Take by mouth daily. Super food    . PROAIR HFA 108 (90 BASE) MCG/ACT inhaler     . Pseudoephedrine-APAP-DM (DAYQUIL PO) Take by mouth.    . Fexofenadine HCl (ALLEGRA PO) Take by mouth as needed.     No current facility-administered medications for this visit.    Family History  Problem Relation Age of Onset  . Deafness Mother   . Diabetes Mother   . Cancer Mother 105    ovarian  . Heart Problems Mother     tachycardia  . Deafness Father   . Hypertension  Father   . Diabetes Father   . Down syndrome Sister   . Diabetes Paternal Grandmother   . Cirrhosis Father     non alcoholic    ROS:  Pertinent items are noted in HPI.  Otherwise, a comprehensive ROS was negative.  Exam:   BP 110/70 mmHg  Pulse 80  Resp 15  Ht 5' 5.25" (1.657 m)  Wt 169 lb (76.658 kg)  BMI 27.92 kg/m2  Weight change: +10#   Height: 5' 5.25" (165.7 cm)  Ht Readings from Last 3 Encounters:  11/28/15 5' 5.25" (1.657 m)  08/26/15 5' 5.75" (1.67 m)  06/24/15 5' 5.75" (1.67 m)   General appearance: alert, cooperative and appears stated age Head: Normocephalic, without obvious abnormality, atraumatic Neck: no adenopathy, supple, symmetrical, trachea midline and thyroid normal to inspection and palpation Lungs: clear to auscultation bilaterally Breasts: normal appearance, no masses or tenderness Heart: regular rate and rhythm Abdomen: soft, non-tender; bowel sounds normal; no masses,  no organomegaly Extremities: extremities normal, atraumatic, no cyanosis or edema Skin: Skin color, texture, turgor normal. No rashes or lesions Lymph nodes: Cervical, supraclavicular, and axillary nodes normal. No abnormal inguinal nodes palpated Neurologic: Grossly normal   Pelvic: External genitalia:  no lesions              Urethra:  normal appearing urethra with no masses, tenderness or lesions              Bartholins and Skenes: normal                 Vagina: normal appearing vagina with normal color and discharge, no lesions              Cervix: no lesions              Pap taken: No. Bimanual Exam:  Uterus:  normal size, contour, position, consistency, mobility, non-tender              Adnexa: normal adnexa and no mass, fullness, tenderness               Rectovaginal: Confirms               Anus:  normal sphincter tone, no lesions  Chaperone was present for exam.  A:  Well Woman with normal exam Family hx of ovarian cancer in mother (deceased) Father died 2023-07-05 due to  pulmonary fibrosis Mirena IUD, third one placed 06/24/15 Endometriosis diagnosed with laparoscopy 10/10 with BTL  P: Yearly mammogram.  Started 10/16 Pap and HR HPV obtained today. Neg HR HPV 3/13 GC/Chl today. HIV, RPR, Hep B. Doing yearly U/S and ca-125. Ca-125 today. PUS will be done in 6 months. Order placed for this for November. return annually or prn

## 2015-11-29 ENCOUNTER — Telehealth: Payer: Self-pay | Admitting: Obstetrics & Gynecology

## 2015-11-29 LAB — STD PANEL
HEP B S AG: NEGATIVE
HIV: NONREACTIVE

## 2015-11-29 LAB — CA 125: CA 125: 27 U/mL (ref <35)

## 2015-11-29 NOTE — Telephone Encounter (Signed)
Called patient to review benefits for a recommended procedure. Left Voicemail requesting a call back. °

## 2015-11-30 LAB — IPS N GONORRHOEA AND CHLAMYDIA BY PCR

## 2015-12-14 ENCOUNTER — Encounter: Payer: Self-pay | Admitting: Obstetrics & Gynecology

## 2015-12-15 ENCOUNTER — Other Ambulatory Visit: Payer: Self-pay | Admitting: Obstetrics & Gynecology

## 2015-12-15 MED ORDER — SULFAMETHOXAZOLE-TRIMETHOPRIM 800-160 MG PO TABS: 1.0000 | ORAL_TABLET | Freq: Two times a day (BID) | ORAL | Status: DC

## 2015-12-23 ENCOUNTER — Ambulatory Visit: Payer: 59 | Admitting: Obstetrics & Gynecology

## 2016-01-24 DIAGNOSIS — J309 Allergic rhinitis, unspecified: Secondary | ICD-10-CM | POA: Insufficient documentation

## 2016-01-24 DIAGNOSIS — J4530 Mild persistent asthma, uncomplicated: Secondary | ICD-10-CM | POA: Insufficient documentation

## 2016-02-14 ENCOUNTER — Ambulatory Visit: Payer: 59 | Admitting: Allergy and Immunology

## 2016-05-01 ENCOUNTER — Encounter: Payer: Self-pay | Admitting: Obstetrics & Gynecology

## 2016-05-17 ENCOUNTER — Ambulatory Visit (INDEPENDENT_AMBULATORY_CARE_PROVIDER_SITE_OTHER): Payer: 59

## 2016-05-17 ENCOUNTER — Encounter: Payer: Self-pay | Admitting: Obstetrics & Gynecology

## 2016-05-17 ENCOUNTER — Ambulatory Visit (INDEPENDENT_AMBULATORY_CARE_PROVIDER_SITE_OTHER): Payer: 59 | Admitting: Obstetrics & Gynecology

## 2016-05-17 VITALS — BP 112/72 | HR 70 | Resp 12 | Ht 66.0 in | Wt 168.0 lb

## 2016-05-17 DIAGNOSIS — Z8041 Family history of malignant neoplasm of ovary: Secondary | ICD-10-CM | POA: Diagnosis not present

## 2016-05-17 DIAGNOSIS — Z30431 Encounter for routine checking of intrauterine contraceptive device: Secondary | ICD-10-CM

## 2016-05-17 DIAGNOSIS — N809 Endometriosis, unspecified: Secondary | ICD-10-CM | POA: Diagnosis not present

## 2016-05-17 NOTE — Progress Notes (Signed)
40 y.o. G2P1 Divorced Caucasian female here for pelvic ultrasound due to family hx of ovarian cancer and personal hx of endometriosis.  Pt reports she is doing well.  She has a Mirena IUD and has very short, light cycles.  Still dating same person now, almost a year.  Things are going well.  She has a normal ca-125 11/28/15.  Patient's last menstrual period was 04/21/2016 (exact date).  Contraception: Mirena IUD  Findings:  UTERUS: 7.9 x 4.3 x 4.0cm with IUD in correct location EMS:3.1mm ADNEXA: Left ovary: 2.2 x 1.7 x 1.7cm       Right ovary: 2.1 x 2.9 x 1.1cm CUL DE SAC: no free fluid  Discussion:  Findings reviewed.  Normal ovaries.  Pt is plannign on ovary removal but nearer to 49 if possible.  For now, continue to have yearly PUS with ca-125 and do this testing alternatively with AEX.  Pt in agreement with plan.  Assessment:  Family hx of ovarian cancer in her mother IUD in correct location  H/O endometriosis  Plan:  Follow up for AEX as scheduled.  ~15 minutes spent with patient >50% of time was in face to face discussion of above.

## 2016-07-13 DIAGNOSIS — B349 Viral infection, unspecified: Secondary | ICD-10-CM | POA: Diagnosis not present

## 2016-07-27 ENCOUNTER — Encounter: Payer: Self-pay | Admitting: Obstetrics & Gynecology

## 2016-07-27 ENCOUNTER — Other Ambulatory Visit: Payer: Self-pay | Admitting: Obstetrics & Gynecology

## 2016-07-27 MED ORDER — ALPRAZOLAM 0.5 MG PO TABS
0.5000 mg | ORAL_TABLET | Freq: Two times a day (BID) | ORAL | 0 refills | Status: DC | PRN
Start: 2016-07-27 — End: 2016-08-09

## 2016-08-01 ENCOUNTER — Telehealth: Payer: Self-pay | Admitting: Obstetrics & Gynecology

## 2016-08-01 NOTE — Telephone Encounter (Signed)
Patient says she need a follow up medication appointment with Dr Sabra Heck.

## 2016-08-01 NOTE — Telephone Encounter (Signed)
Spoke with patient. Patient calling to schedule f/u for new xanax prescription started 07/27/16. See MyChart encounter dated 07/27/16. Patient scheduled for 08/09/16 at 0830 with Dr. Sabra Heck. Patient is agreeable to date and time.  Routing to provider for final review. Patient is agreeable to disposition. Will close encounter.

## 2016-08-07 DIAGNOSIS — R05 Cough: Secondary | ICD-10-CM | POA: Diagnosis not present

## 2016-08-07 DIAGNOSIS — J069 Acute upper respiratory infection, unspecified: Secondary | ICD-10-CM | POA: Diagnosis not present

## 2016-08-07 DIAGNOSIS — R5383 Other fatigue: Secondary | ICD-10-CM | POA: Diagnosis not present

## 2016-08-09 ENCOUNTER — Encounter: Payer: Self-pay | Admitting: Obstetrics & Gynecology

## 2016-08-09 ENCOUNTER — Ambulatory Visit (INDEPENDENT_AMBULATORY_CARE_PROVIDER_SITE_OTHER): Payer: 59 | Admitting: Obstetrics & Gynecology

## 2016-08-09 VITALS — BP 122/98 | HR 91 | Resp 14 | Ht 65.0 in | Wt 159.0 lb

## 2016-08-09 DIAGNOSIS — F4322 Adjustment disorder with anxiety: Secondary | ICD-10-CM

## 2016-08-09 MED ORDER — ALPRAZOLAM 0.5 MG PO TABS: 0.5000 mg | ORAL_TABLET | Freq: Two times a day (BID) | ORAL | 0 refills | Status: DC | PRN

## 2016-08-09 NOTE — Progress Notes (Signed)
GYNECOLOGY  VISIT   HPI: 41 y.o. G2P1 Divorced Caucasian female with complaint of increased anxiety due to a variety of changes that have recently occurred in her life.  Boyfriend of 13 months broke up with her via text messages.  She had the flu and then a bacterial secondary infection.  Yesterday, there were layoffs within the company.  One of her good colleagues was fired.  She works for formerly [employer].   She has not been able to go to the gym for about a month but has started back this week.  She has basically not been using the Xanax since then.  D/w pt whether she feels this is short term or long term from anxiety standpoint.  Pt reports she is feeling better because her flu and respiratory infection is improving.  Does not feel she needs any treatment for anxiety d/o as symptoms are already improved.  Denies depression symptoms.  Has good support from friends.  Denies sleep issues.  GYNECOLOGIC HISTORY: No LMP recorded. Patient is not currently having periods (Reason: IUD). Contraception: IUD  Patient Active Problem List   Diagnosis Date Noted  . Hearing loss 05/29/2015  . 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 Endometriosis 05/10/2014  . Excessive or frequent menstruation 05/10/2014  . B12 DEFICIENCY 03/20/2007    Past Medical History:  Diagnosis Date  . Abnormal Pap smear    history of colposcopy ECC with atypical squamous metaplasia  . Congenital hearing loss    started age 54  . Endometriosis 10/10  . Hx of migraines    increased frequency with estrogen containing OCPs    Past Surgical History:  Procedure Laterality Date  . BREAST REDUCTION SURGERY  9/10  . IMPLANTATION OF ENVOY ESTEEM HEARING DEVICE     for hearing loss  . TUBAL LIGATION  10/10   cautery of endometriosis    MEDS:  Reviewed in EPIC and UTD  ALLERGIES: Augmentin [amoxicillin-pot clavulanate]; Ciprofloxacin; Codeine; and Shrimp [shellfish allergy]  Family History  Problem Relation Age of  Onset  . Deafness Mother   . Diabetes Mother   . Cancer Mother 61    ovarian  . Heart Problems Mother     tachycardia  . Deafness Father   . Hypertension Father   . Diabetes Father   . Down syndrome Sister   . Diabetes Paternal Grandmother   . Cirrhosis Father     non alcoholic    SH:  Divorced, non smoker  Review of Systems  Constitutional: Negative.   Psychiatric/Behavioral: Negative for depression and suicidal ideas. The patient is not nervous/anxious and does not have insomnia.     PHYSICAL EXAMINATION:    BP (!) 122/98 (BP Location: Right Arm, Patient Position: Sitting, Cuff Size: Normal)   Pulse 91   Resp 14   Ht 5\' 5"  (1.651 m)   Wt 159 lb (72.1 kg)   BMI 26.46 kg/m     General appearance: alert, cooperative and appears stated age No other exam performed  Chaperone was present for exam.  Assessment: Adjustment d/o with anxiety due to ending recent relationship Recent URI  Plan: RF for Xanax 0.5mg  1 up to two times daily as needed.  Pt aware I feel she will need to transition to an SSRI if finds that she is going to use all of this rx.  Pt voices complete understanding.   ~15 minutes spent with patient >50% of time was in face to face discussion of above.

## 2016-08-14 ENCOUNTER — Ambulatory Visit (INDEPENDENT_AMBULATORY_CARE_PROVIDER_SITE_OTHER): Payer: 59 | Admitting: Allergy and Immunology

## 2016-08-14 ENCOUNTER — Encounter: Payer: Self-pay | Admitting: Allergy and Immunology

## 2016-08-14 VITALS — BP 122/72 | HR 74 | Temp 97.6°F | Resp 16 | Ht 66.0 in | Wt 159.4 lb

## 2016-08-14 DIAGNOSIS — T7800XA Anaphylactic reaction due to unspecified food, initial encounter: Secondary | ICD-10-CM | POA: Insufficient documentation

## 2016-08-14 DIAGNOSIS — J3089 Other allergic rhinitis: Secondary | ICD-10-CM | POA: Diagnosis not present

## 2016-08-14 DIAGNOSIS — J4530 Mild persistent asthma, uncomplicated: Secondary | ICD-10-CM | POA: Diagnosis not present

## 2016-08-14 MED ORDER — ALBUTEROL SULFATE 108 (90 BASE) MCG/ACT IN AEPB: 2.0000 | INHALATION_SPRAY | RESPIRATORY_TRACT | 3 refills | Status: DC | PRN

## 2016-08-14 MED ORDER — EPINEPHRINE 0.3 MG/0.3ML IJ SOAJ: 0.3000 mg | Freq: Once | INTRAMUSCULAR | 2 refills | Status: AC

## 2016-08-14 NOTE — Patient Instructions (Addendum)
Mild persistent asthma Well-controlled, we will stepdown therapy at this time.  Decrease Qvar 80 g from 2 inhalations twice a day to 1 inhalation twice a day.  To maximize pulmonary deposition, a spacer has been provided along with instructions for its proper administration with an HFA inhaler.  A prescription has been provided for ProAir Respiclick, 1-2 inhalations every 4-6 hours as needed and 15 minutes prior to vigorous exercise.  Subjective and objective measures of pulmonary function will be followed and the treatment plan will be adjusted accordingly.  Food allergy The patient's history suggests shellfish and fish allergy.  However, we were unable to perform skin tests today due to recent administration of antihistamine.   The patient is scheduled to return next week for allergy skin testing after having been off of antihistamines for at least 3 days. Further recommendations will be made at that time based upon skin test results.  Until food allergy has been definitively ruled out, continue meticulous avoidance of fish and shellfish.  A prescription has been provided for epinephrine 0.3 mg autoinjector 2 pack along with instructions for its proper administration.  Allergic rhinitis   Edrena will return in the near future for allergy skin testing after having been off of antihistamines for 3 days. Recommendations will be made based upon those results.   Return in about 1 week (around 08/21/2016) for allergy skin testing.

## 2016-08-14 NOTE — Assessment & Plan Note (Signed)
The patient's history suggests shellfish and fish allergy.  However, we were unable to perform skin tests today due to recent administration of antihistamine.   The patient is scheduled to return next week for allergy skin testing after having been off of antihistamines for at least 3 days. Further recommendations will be made at that time based upon skin test results.  Until food allergy has been definitively ruled out, continue meticulous avoidance of fish and shellfish.  A prescription has been provided for epinephrine 0.3 mg autoinjector 2 pack along with instructions for its proper administration.

## 2016-08-14 NOTE — Addendum Note (Signed)
Addended by: Golda Acre C on: 08/14/2016 01:00 PM   Modules accepted: Orders

## 2016-08-14 NOTE — Progress Notes (Addendum)
New Patient Note  RE: Margaret Ford MRN: [id number] DOB: 01-Jan-1976 Date of Office Visit: 08/14/2016  Referring provider: [name], MD Primary care provider: [name], MD  Chief Complaint: Asthma; Allergic Rhinitis ; and Allergic Reaction (Shellfish)   History of present illness: Margaret Ford is a 41 y.o. female seen today in consultation requested by [name], MD.  She reports that she was diagnosed with asthma in May 2017.  She states that she "could not stop coughing."  She currently takes Qvar 80 g, 2 inhalations twice a day without a spacer device.  She had been using albuterol as needed and 15 minutes prior to exercise. However, she has noticed that after having lost 15 pounds with the flu last month that she has not required albuterol rescue or prophylactically before exercise.  She experiences frequent nasal congestion, rhinorrhea, sneezing, thick postnasal drainage, and sinus pressure.  Fluticasone nasal spray has seems to provide moderate benefit.  The symptoms occur year round but seem to be more frequent and severe in the springtime and in the fall.  Specific triggers include pollen exposure, second hand cigarette smoke exposure, and rapid weather changes.  She states that she typically has 3 or 4 sinus infections per year requiring antibiotics.  Approximately 8 years ago, she consumed shrimp and developed pharyngeal pruritus, nausea, and vomiting.  She has experienced similar symptoms after consuming sushi.  When this occurs, she takes diphenhydramine with gradual symptom reduction.  She currently does not have access to an epinephrine autoinjector.   Assessment and plan: Mild persistent asthma Well-controlled, we will stepdown therapy at this time.  Decrease Qvar 80 g from 2 inhalations twice a day to 1 inhalation twice a day.  To maximize pulmonary deposition, a spacer has been provided along with instructions for its proper administration  with an HFA inhaler.  A prescription has been provided for ProAir Respiclick, 1-2 inhalations every 4-6 hours as needed and 15 minutes prior to vigorous exercise.  Subjective and objective measures of pulmonary function will be followed and the treatment plan will be adjusted accordingly.  Food allergy The patient's history suggests shellfish and fish allergy.  However, we were unable to perform skin tests today due to recent administration of antihistamine.   The patient is scheduled to return next week for allergy skin testing after having been off of antihistamines for at least 3 days. Further recommendations will be made at that time based upon skin test results.  Until food allergy has been definitively ruled out, continue meticulous avoidance of fish and shellfish.  A prescription has been provided for epinephrine 0.3 mg autoinjector 2 pack along with instructions for its proper administration.  Allergic rhinitis   [name] will return in the near future for allergy skin testing after having been off of antihistamines for 3 days. Recommendations will be made based upon those results.   Meds ordered this encounter  Medications  . Albuterol Sulfate (PROAIR RESPICLICK) 123XX123 (90 Base) MCG/ACT AEPB    Sig: Inhale 2 puffs into the lungs as needed. 1-2 inhalations every 4-6 hours as needed and 15 minutes prior to vigorous exercise.    Dispense:  1 each    Refill:  3  . EPINEPHrine (EPIPEN 2-PAK) 0.3 mg/0.3 mL IJ SOAJ injection    Sig: Inject 0.3 mLs (0.3 mg total) into the muscle once.    Dispense:  2 Device    Refill:  2    Diagnostics: Spirometry: FVC was 4.20 L and FEV1 was  2.84 L with an FEV1 ratio of 82%.  There was 30 mL (1% postbronchodilator improvement.  Mild airways obstruction without significant reversibility.  This study was performed while the patient was asymptomatic.  Please see scanned spirometry results for details. Allergy skin testing: We were unable to perform skin  tests today due to recent administration of antihistamine.     Physical examination: Blood pressure 122/72, pulse 74, temperature 97.6 F (36.4 C), temperature source Oral, resp. rate 16, height 5\' 6"  (1.676 m), weight 159 lb 6.4 oz (72.3 kg), SpO2 96 %.  General: Alert, interactive, in no acute distress. HEENT: Turbinates moderately edematous without discharge, post-pharynx mildly erythematous. Neck: Supple without lymphadenopathy. Lungs: Clear to auscultation without wheezing, rhonchi or rales. CV: Normal S1, S2 without murmurs. Abdomen: Nondistended, nontender. Skin: Warm and dry, without lesions or rashes. Extremities:  No clubbing, cyanosis or edema. Neuro:   Grossly intact.  Review of systems:  Review of systems negative except as noted in HPI / PMHx or noted below: Review of Systems  Constitutional: Negative.   HENT: Negative.   Eyes: Negative.   Respiratory: Negative.   Cardiovascular: Negative.   Gastrointestinal: Negative.   Genitourinary: Negative.   Musculoskeletal: Negative.   Skin: Negative.   Neurological: Negative.   Endo/Heme/Allergies: Negative.   Psychiatric/Behavioral: Negative.     Past medical history:  Past Medical History:  Diagnosis Date  . Abnormal Pap smear    history of colposcopy ECC with atypical squamous metaplasia  . Asthma   . Congenital hearing loss    started age 71  . Endometriosis 10/10  . Hx of migraines    increased frequency with estrogen containing OCPs  . Recurrent upper respiratory infection (URI)     Past surgical history:  Past Surgical History:  Procedure Laterality Date  . BREAST REDUCTION SURGERY  9/10  . IMPLANTATION OF ENVOY ESTEEM HEARING DEVICE     for hearing loss  . TUBAL LIGATION  10/10   cautery of endometriosis    Family history: Family History  Problem Relation Age of Onset  . Deafness Mother   . Diabetes Mother   . Cancer Mother 67    ovarian  . Heart Problems Mother     tachycardia  .  Deafness Father   . Hypertension Father   . Diabetes Father   . Cirrhosis Father     non alcoholic  . Down syndrome Sister   . Diabetes Paternal Grandmother     Social history: Social History   Social History  . Marital status: Divorced    Spouse name: N/A  . Number of children: N/A  . Years of education: N/A   Occupational History  . Not on file.   Social History Main Topics  . Smoking status: Never Smoker  . Smokeless tobacco: Never Used  . Alcohol use 0.0 oz/week     Comment: socially  . Drug use: No  . Sexual activity: Yes    Partners: Male    Birth control/ protection: Surgical     Comment: BTL   Other Topics Concern  . Not on file   Social History Narrative  . No narrative on file   Environmental History: The patient lives in a 42 year old house with carpeting in the bedroom and central air/heat.  There is a dog in house which has access to her bedroom.  There is no known water damage or mildew in the home.  She is a nonsmoker but is exposed to significant secondhand cigarette  smoke at her place of employment.  Allergies as of 08/14/2016      Reactions   Augmentin [amoxicillin-pot Clavulanate]    Ciprofloxacin    Codeine Other (See Comments)   "sick"   Shrimp [shellfish Allergy]       Medication List       Accurate as of 08/14/16  1:00 PM. Always use your most recent med list.          Albuterol Sulfate 108 (90 Base) MCG/ACT Aepb Commonly known as:  PROAIR RESPICLICK Inhale 2 puffs into the lungs as needed. 1-2 inhalations every 4-6 hours as needed and 15 minutes prior to vigorous exercise.   ALLEGRA PO Take by mouth as needed.   ALPRAZolam 0.5 MG tablet Commonly known as:  XANAX Take 1 tablet (0.5 mg total) by mouth 2 (two) times daily as needed for anxiety.   azelastine 0.1 % nasal spray Commonly known as:  ASTELIN Place into the nose.   azithromycin 250 MG tablet Commonly known as:  ZITHROMAX   beclomethasone 80 MCG/ACT inhaler Commonly  known as:  QVAR Inhale into the lungs.   QVAR 80 MCG/ACT inhaler Generic drug:  beclomethasone Inhale into the lungs.   betamethasone acetate-betamethasone sodium phosphate 6 (3-3) MG/ML injection Commonly known as:  CELESTONE Inject 12 mg into the articular space.   DAYQUIL PO Take by mouth.   ELDERBERRY PO Take by mouth daily.   EPINEPHrine 0.3 mg/0.3 mL Soaj injection Commonly known as:  EPIPEN 2-PAK Inject 0.3 mLs (0.3 mg total) into the muscle once.   fluticasone 50 MCG/ACT nasal spray Commonly known as:  FLONASE   MULTIVITAMIN GUMMIES ADULT PO Take by mouth daily. Super food   NON FORMULARY Antihistamine nasal spray- 2 sprays- 2x day   VITAMIN D PO Take 2,000 Int'l Units by mouth daily.       Known medication allergies: Allergies  Allergen Reactions  . Augmentin [Amoxicillin-Pot Clavulanate]   . Ciprofloxacin   . Codeine Other (See Comments)    "sick"  . Shrimp [Shellfish Allergy]     I appreciate the opportunity to take part in Tailynn's care. Please do not hesitate to contact me with questions.  Sincerely,   R. Edgar Frisk, MD

## 2016-08-14 NOTE — Assessment & Plan Note (Signed)
Well-controlled, we will stepdown therapy at this time.  Decrease Qvar 80 g from 2 inhalations twice a day to 1 inhalation twice a day.  To maximize pulmonary deposition, a spacer has been provided along with instructions for its proper administration with an HFA inhaler.  A prescription has been provided for ProAir Respiclick, 1-2 inhalations every 4-6 hours as needed and 15 minutes prior to vigorous exercise.  Subjective and objective measures of pulmonary function will be followed and the treatment plan will be adjusted accordingly.

## 2016-08-14 NOTE — Assessment & Plan Note (Addendum)
    Margaret Ford will return in the near future for allergy skin testing after having been off of antihistamines for 3 days. Recommendations will be made based upon those results.

## 2016-08-27 ENCOUNTER — Encounter: Payer: Self-pay | Admitting: Internal Medicine

## 2016-08-27 DIAGNOSIS — Z45321 Encounter for adjustment and management of cochlear device: Secondary | ICD-10-CM | POA: Diagnosis not present

## 2016-08-27 DIAGNOSIS — H903 Sensorineural hearing loss, bilateral: Secondary | ICD-10-CM | POA: Diagnosis not present

## 2016-08-28 ENCOUNTER — Encounter: Payer: Self-pay | Admitting: Allergy and Immunology

## 2016-08-28 ENCOUNTER — Telehealth: Payer: Self-pay

## 2016-08-28 ENCOUNTER — Ambulatory Visit (INDEPENDENT_AMBULATORY_CARE_PROVIDER_SITE_OTHER): Payer: 59 | Admitting: Allergy and Immunology

## 2016-08-28 VITALS — BP 94/60 | HR 65 | Temp 97.4°F | Resp 16 | Ht 66.0 in | Wt 159.2 lb

## 2016-08-28 DIAGNOSIS — J3089 Other allergic rhinitis: Secondary | ICD-10-CM | POA: Diagnosis not present

## 2016-08-28 DIAGNOSIS — T7800XD Anaphylactic reaction due to unspecified food, subsequent encounter: Secondary | ICD-10-CM | POA: Diagnosis not present

## 2016-08-28 DIAGNOSIS — J4530 Mild persistent asthma, uncomplicated: Secondary | ICD-10-CM

## 2016-08-28 NOTE — Assessment & Plan Note (Signed)
Margaret Ford's history suggests shellfish and fish allergy.  However, all food allergen skin tests were negative despite a positive histamine control.  The negative predictive value of food allergen skin testing is excellent, though there is still 5% chance that the allergy exists.    Laboratory order form has been provided for serum specific IgE against shellfish panel and fish panel.  If laboratory results are negative, we will proceed with open graded oral challenges.  Until food allergy has been definitively ruled out, she is to continue careful avoidance of fish and shellfish.

## 2016-08-28 NOTE — Patient Instructions (Signed)
History of food allergy Margaret Ford's history suggests shellfish and fish allergy.  However, all food allergen skin tests were negative despite a positive histamine control.  The negative predictive value of food allergen skin testing is excellent, though there is still 5% chance that the allergy exists.    Laboratory order form has been provided for serum specific IgE against shellfish panel and fish panel.  If laboratory results are negative, we will proceed with open graded oral challenges.  Until food allergy has been definitively ruled out, she is to continue careful avoidance of fish and shellfish.  Mild persistent asthma Well-controlled on current treatment plan.  Continue Qvar 80 g, one inhalation via spacer device twice a day, and albuterol every 4-6 hours as needed.  During respiratory tract infections or asthma flares, increase Qvar 80 g to 3 inhalations 2 times per day until symptoms have returned to baseline.  Subjective and objective measures of pulmonary function will be followed and the treatment plan will be adjusted accordingly.  Allergic rhinitis  Aeroallergen avoidance measures have been discussed and provided in written form.  Azelastine nasal spray, 2 sprays per nostril twice a day.  I have also recommended nasal saline spray (i.e., Simply Saline) or nasal saline lavage (i.e., NeilMed) as needed prior to medicated nasal sprays.  For thick post nasal drainage, nasal congestion, and/or sinus pressure, add guaifenesin [phone] mg (Mucinex) plus/minus pseudoephedrine 60-120 mg  twice daily as needed with adequate hydration as discussed. Pseudoephedrine is only to be used for short-term relief of nasal/sinus congestion. Long-term use is discouraged due to potential side effects.   When lab results have returned the patient will be called with further recommendations and follow up instructions.  Control of House Dust Mite Allergen  House dust mites play a major role in  allergic asthma and rhinitis.  They occur in environments with high humidity wherever human skin, the food for dust mites is found. High levels have been detected in dust obtained from mattresses, pillows, carpets, upholstered furniture, bed covers, clothes and soft toys.  The principal allergen of the house dust mite is found in its feces.  A gram of dust may contain 1,000 mites and 250,000 fecal particles.  Mite antigen is easily measured in the air during house cleaning activities.    1. Encase mattresses, including the box spring, and pillow, in an air tight cover.  Seal the zipper end of the encased mattresses with wide adhesive tape. 2. Wash the bedding in water of 130 degrees Farenheit weekly.  Avoid cotton comforters/quilts and flannel bedding: the most ideal bed covering is the dacron comforter. 3. Remove all upholstered furniture from the bedroom. 4. Remove carpets, carpet padding, rugs, and non-washable window drapes from the bedroom.  Wash drapes weekly or use plastic window coverings. 5. Remove all non-washable stuffed toys from the bedroom.  Wash stuffed toys weekly. 6. Have the room cleaned frequently with a vacuum cleaner and a damp dust-mop.  The patient should not be in a room which is being cleaned and should wait 1 hour after cleaning before going into the room. 7. Close and seal all heating outlets in the bedroom.  Otherwise, the room will become filled with dust-laden air.  An electric heater can be used to heat the room. 8. Reduce indoor humidity to less than 50%.  Do not use a humidifier.  Control of Mold Allergen  Mold and fungi can grow on a variety of surfaces provided certain temperature and moisture conditions exist.  Outdoor molds grow on plants, decaying vegetation and soil.  The major outdoor mold, Alternaria and Cladosporium, are found in very high numbers during hot and dry conditions.  Generally, a late Summer - Fall peak is seen for common outdoor fungal spores.   Rain will temporarily lower outdoor mold spore count, but counts rise rapidly when the rainy period ends.  The most important indoor molds are Aspergillus and Penicillium.  Dark, humid and poorly ventilated basements are ideal sites for mold growth.  The next most common sites of mold growth are the bathroom and the kitchen.  Outdoor [employer] 1. Use air conditioning and keep windows closed 2. Avoid exposure to decaying vegetation. 3. Avoid leaf raking. 4. Avoid grain handling. 5. Consider wearing a face mask if working in moldy areas.  Indoor Mold Control 1. Maintain humidity below 50%. 2. Clean washable surfaces with 5% bleach solution. 3. Remove sources e.g. Contaminated carpets.

## 2016-08-28 NOTE — Assessment & Plan Note (Addendum)
   Aeroallergen avoidance measures have been discussed and provided in written form.  Azelastine nasal spray, 2 sprays per nostril twice a day.  I have also recommended nasal saline spray (i.e., Simply Saline) or nasal saline lavage (i.e., NeilMed) as needed prior to medicated nasal sprays.  For thick post nasal drainage, nasal congestion, and/or sinus pressure, add guaifenesin [phone] mg (Mucinex) plus/minus pseudoephedrine 60-120 mg  twice daily as needed with adequate hydration as discussed. Pseudoephedrine is only to be used for short-term relief of nasal/sinus congestion. Long-term use is discouraged due to potential side effects.

## 2016-08-28 NOTE — Assessment & Plan Note (Signed)
Well-controlled on current treatment plan.  Continue Qvar 80 g, one inhalation via spacer device twice a day, and albuterol every 4-6 hours as needed.  During respiratory tract infections or asthma flares, increase Qvar 80 g to 3 inhalations 2 times per day until symptoms have returned to baseline.  Subjective and objective measures of pulmonary function will be followed and the treatment plan will be adjusted accordingly.

## 2016-08-28 NOTE — Progress Notes (Signed)
Follow-up Note  RE: Margaret Ford MRN: [id number] DOB: 1976/01/02 Date of Office Visit: 08/28/2016  Primary care provider: [name], MD Referring provider: [name], MD  History of present illness: Margaret Ford is a 41 y.o. female with asthma, chronic rhinitis, and history suggestive of food allergy presenting today for allergy skin testing.  We were unable to proceed with skin testing during her previous evaluation due to recent administration of antihistamine.  She has been off of antihistamines for at least 3 days in anticipation of today's skin tests.  Please see history of present illness from her initial evaluation on 08/14/2016 for details. She has no new nasal or asthma related complaints today. She currently avoids shellfish.   Assessment and plan: History of food allergy Margaret Ford's history suggests shellfish and fish allergy.  However, all food allergen skin tests were negative despite a positive histamine control.  The negative predictive value of food allergen skin testing is excellent, though there is still 5% chance that the allergy exists.    Laboratory order form has been provided for serum specific IgE against shellfish panel and fish panel.  If laboratory results are negative, we will proceed with open graded oral challenges.  Until food allergy has been definitively ruled out, she is to continue careful avoidance of fish and shellfish.  Mild persistent asthma Well-controlled on current treatment plan.  Continue Qvar 80 g, one inhalation via spacer device twice a day, and albuterol every 4-6 hours as needed.  During respiratory tract infections or asthma flares, increase Qvar 80 g to 3 inhalations 2 times per day until symptoms have returned to baseline.  Subjective and objective measures of pulmonary function will be followed and the treatment plan will be adjusted accordingly.  Allergic rhinitis  Aeroallergen avoidance measures have been  discussed and provided in written form.  Azelastine nasal spray, 2 sprays per nostril twice a day.  I have also recommended nasal saline spray (i.e., Simply Saline) or nasal saline lavage (i.e., NeilMed) as needed prior to medicated nasal sprays.  For thick post nasal drainage, nasal congestion, and/or sinus pressure, add guaifenesin [id number] mg (Mucinex) plus/minus pseudoephedrine 60-120 mg  twice daily as needed with adequate hydration as discussed. Pseudoephedrine is only to be used for short-term relief of nasal/sinus congestion. Long-term use is discouraged due to potential side effects.   Diagnostics: Spirometry:  Normal with an FEV1 of 93% predicted.  Please see scanned spirometry results for details. Aeroallergen skin testing: Positive to molds and dust mite antigen. Food allergen skin testing: Negative despite a positive histamine control.     Physical examination: Blood pressure 94/60, pulse 65, temperature 97.4 F (36.3 C), temperature source Oral, resp. rate 16, height 5\' 6"  (1.676 m), weight 159 lb 3.2 oz (72.2 kg), SpO2 97 %.  General: Alert, interactive, in no acute distress. HEENT: TMs pearly gray, turbinates moderately edematous without discharge, post-pharynx mildly erythematous. Neck: Supple without lymphadenopathy. Lungs: Clear to auscultation without wheezing, rhonchi or rales. CV: Normal S1, S2 without murmurs. Skin: Warm and dry, without lesions or rashes.  The following portions of the patient's history were reviewed and updated as appropriate: allergies, current medications, past family history, past medical history, past social history, past surgical history and problem list.  Allergies as of 08/28/2016      Reactions   Augmentin [amoxicillin-pot Clavulanate]    Ciprofloxacin    Codeine Other (See Comments)   "sick"   Shrimp [shellfish Allergy]       Medication List  Accurate as of 08/28/16  7:19 PM. Always use your most recent med list.            Albuterol Sulfate 108 (90 Base) MCG/ACT Aepb Commonly known as:  PROAIR RESPICLICK Inhale 2 puffs into the lungs as needed. 1-2 inhalations every 4-6 hours as needed and 15 minutes prior to vigorous exercise.   ALLEGRA PO Take by mouth as needed.   ALPRAZolam 0.5 MG tablet Commonly known as:  XANAX Take 1 tablet (0.5 mg total) by mouth 2 (two) times daily as needed for anxiety.   azelastine 0.1 % nasal spray Commonly known as:  ASTELIN Place into the nose.   ELDERBERRY PO Take by mouth daily.   fluticasone 50 MCG/ACT nasal spray Commonly known as:  FLONASE   MULTIVITAMIN GUMMIES ADULT PO Take by mouth daily. Super food   NON FORMULARY Antihistamine nasal spray- 2 sprays- 2x day   QVAR 80 MCG/ACT inhaler Generic drug:  beclomethasone Inhale into the lungs.   VITAMIN D PO Take 2,000 Int'l Units by mouth daily.       Allergies  Allergen Reactions  . Augmentin [Amoxicillin-Pot Clavulanate]   . Ciprofloxacin   . Codeine Other (See Comments)    "sick"  . Shrimp [Shellfish Allergy]    Review of systems: Review of systems negative except as noted in HPI / PMHx or noted below: Constitutional: Negative.  HENT: Negative.   Eyes: Negative.  Respiratory: Negative.   Cardiovascular: Negative.  Gastrointestinal: Negative.  Genitourinary: Negative.  Musculoskeletal: Negative.  Neurological: Negative.  Endo/Heme/Allergies: Negative.  Cutaneous: Negative.  Past Medical History:  Diagnosis Date  . Abnormal Pap smear    history of colposcopy ECC with atypical squamous metaplasia  . Asthma   . Congenital hearing loss    started age 29  . Endometriosis 10/10  . Hx of migraines    increased frequency with estrogen containing OCPs  . Recurrent upper respiratory infection (URI)     Family History  Problem Relation Age of Onset  . Deafness Mother   . Diabetes Mother   . Cancer Mother 20    ovarian  . Heart Problems Mother     tachycardia  . Deafness Father    . Hypertension Father   . Diabetes Father   . Cirrhosis Father     non alcoholic  . Down syndrome Sister   . Diabetes Paternal Grandmother   . Allergic rhinitis Neg Hx   . Angioedema Neg Hx   . Asthma Neg Hx   . Eczema Neg Hx   . Immunodeficiency Neg Hx   . Urticaria Neg Hx     Social History   Social History  . Marital status: Divorced    Spouse name: N/A  . Number of children: N/A  . Years of education: N/A   Occupational History  . Not on file.   Social History Main Topics  . Smoking status: Never Smoker  . Smokeless tobacco: Never Used  . Alcohol use 0.0 oz/week     Comment: socially  . Drug use: No  . Sexual activity: Yes    Partners: Male    Birth control/ protection: Surgical     Comment: BTL   Other Topics Concern  . Not on file   Social History Narrative  . No narrative on file   Review of systems: Review of systems negative except as noted in HPI / PMHx or noted below: Constitutional: Negative.  HENT: Negative.   Eyes: Negative.  Respiratory: Negative.  Cardiovascular: Negative.  Gastrointestinal: Negative.  Genitourinary: Negative.  Musculoskeletal: Negative.  Neurological: Negative.  Endo/Heme/Allergies: Negative.  Cutaneous: Negative.  Past Medical History:  Diagnosis Date  . Abnormal Pap smear    history of colposcopy ECC with atypical squamous metaplasia  . Asthma   . Congenital hearing loss    started age 31  . Endometriosis 10/10  . Hx of migraines    increased frequency with estrogen containing OCPs  . Recurrent upper respiratory infection (URI)     Family History  Problem Relation Age of Onset  . Deafness Mother   . Diabetes Mother   . Cancer Mother 53    ovarian  . Heart Problems Mother     tachycardia  . Deafness Father   . Hypertension Father   . Diabetes Father   . Cirrhosis Father     non alcoholic  . Down syndrome Sister   . Diabetes Paternal Grandmother   . Allergic rhinitis Neg Hx   . Angioedema Neg Hx    . Asthma Neg Hx   . Eczema Neg Hx   . Immunodeficiency Neg Hx   . Urticaria Neg Hx     Social History   Social History  . Marital status: Divorced    Spouse name: N/A  . Number of children: N/A  . Years of education: N/A   Occupational History  . Not on file.   Social History Main Topics  . Smoking status: Never Smoker  . Smokeless tobacco: Never Used  . Alcohol use 0.0 oz/week     Comment: socially  . Drug use: No  . Sexual activity: Yes    Partners: Male    Birth control/ protection: Surgical     Comment: BTL   Other Topics Concern  . Not on file   Social History Narrative  . No narrative on file    I appreciate the opportunity to take part in Saquoia's care. Please do not hesitate to contact me with questions.  Sincerely,   R. Edgar Frisk, MD

## 2016-08-28 NOTE — Telephone Encounter (Signed)
-----   Message from Gatha Mayer, MD sent at 08/28/2016 11:29 AM EST ----- Regarding: offer her earlier appt Please call her - she is a friend of a friend  See if she could see [place] on Thurs PM - can use banding slot  Otherwise see if I have something we could squeeze her in to but not next week (ERCP b/u)

## 2016-08-28 NOTE — Telephone Encounter (Signed)
Appointment set up for 08/30/16 at 3:15pm with Dr Carlean Purl and patient aware of location.

## 2016-08-30 ENCOUNTER — Ambulatory Visit (INDEPENDENT_AMBULATORY_CARE_PROVIDER_SITE_OTHER): Payer: 59 | Admitting: Internal Medicine

## 2016-08-30 ENCOUNTER — Encounter: Payer: Self-pay | Admitting: Internal Medicine

## 2016-08-30 VITALS — BP 100/68 | HR 72 | Ht 65.5 in | Wt 159.0 lb

## 2016-08-30 DIAGNOSIS — G43A0 Cyclical vomiting, not intractable: Secondary | ICD-10-CM | POA: Diagnosis not present

## 2016-08-30 DIAGNOSIS — F439 Reaction to severe stress, unspecified: Secondary | ICD-10-CM

## 2016-08-30 DIAGNOSIS — K591 Functional diarrhea: Secondary | ICD-10-CM | POA: Diagnosis not present

## 2016-08-30 DIAGNOSIS — K580 Irritable bowel syndrome with diarrhea: Secondary | ICD-10-CM | POA: Diagnosis not present

## 2016-08-30 DIAGNOSIS — R1115 Cyclical vomiting syndrome unrelated to migraine: Secondary | ICD-10-CM

## 2016-08-30 MED ORDER — RANITIDINE HCL 150 MG PO CAPS: 150.0000 mg | ORAL_CAPSULE | Freq: Two times a day (BID) | ORAL | 1 refills | Status: DC

## 2016-08-30 MED ORDER — ONDANSETRON HCL 4 MG PO TABS: 4.0000 mg | ORAL_TABLET | Freq: Three times a day (TID) | ORAL | 1 refills | Status: DC | PRN

## 2016-08-30 NOTE — Patient Instructions (Signed)
   Today we are giving you VSL #3 to take one daily for a month.  This is a probiotic.   We have sent the following medications to your pharmacy for you to pick up at your convenience: Zantac 150mg , and we are giving you a printed rx to use for zofran when you run out. Take the zofran before meals.   Eat a soft brand diet and advance it as tolerated.   I appreciate the opportunity to care for you. Silvano Rusk, MD, [facility]

## 2016-08-30 NOTE — Progress Notes (Signed)
Margaret Ford 40 y.o. Mar 10, 1976 [id number]  Assessment & Plan:   Encounter Diagnoses  Name Primary?  . Non-intractable cyclical vomiting with nausea Yes  . Functional diarrhea   . Irritable bowel syndrome with diarrhea - post infectious   . Situational stress    Ondansetron 4 mg before meals VSL # 3 daily x 1 month Ranitidine 150 mg bid x 1 month Anticipate this should resolve with time - if it fails to then she is to call me back in 1 month Suspect stress overlay a part of this also   Subjective:   Chief Complaint: nausea and vomiting, diarrhea and weight loss  HPI 41 yo ww developed a flu-like illness in early January - URI sxs, weakness and nausea+ vomiting and diarrhea. Over time URI sxs gone but still having a lot of nausea w/ some vomiting, loose stools - forming up, and heartburn.slight chest pressure but no overt dysphagia. In the midst of this her boyfriend broke up with her, her colleague at work was laid off and those job responsibilities were given to her - and last night she had a panic attack for the first time in 8 yrs. No bleeding, no fevers lately. She is also going to school at night which adds to stress.  Allergies  Allergen Reactions  . Augmentin [Amoxicillin-Pot Clavulanate]   . Ciprofloxacin   . Codeine Other (See Comments)    "sick"  . Shrimp [Shellfish Allergy]    Current Meds  Medication Sig  . Albuterol Sulfate (PROAIR RESPICLICK) 123XX123 (90 Base) MCG/ACT AEPB Inhale 2 puffs into the lungs as needed. 1-2 inhalations every 4-6 hours as needed and 15 minutes prior to vigorous exercise.  Marland Kitchen ALPRAZolam (XANAX) 0.5 MG tablet Take 1 tablet (0.5 mg total) by mouth 2 (two) times daily as needed for anxiety.  Marland Kitchen azelastine (ASTELIN) 0.1 % nasal spray Place 2 sprays into the nose 2 (two) times daily.   . beclomethasone (QVAR) 80 MCG/ACT inhaler Inhale into the lungs.  . Cholecalciferol (VITAMIN D PO) Take 2,000 Int'l Units by mouth daily.  Marland Kitchen ELDERBERRY PO  Take by mouth daily.  Marland Kitchen Fexofenadine HCl (ALLEGRA PO) Take by mouth as needed.  . fluticasone (FLONASE) 50 MCG/ACT nasal spray   . Multiple Vitamins-Minerals (MULTIVITAMIN GUMMIES ADULT PO) Take by mouth daily. Super food  . NON FORMULARY Antihistamine nasal spray- 2 sprays- 2x day   Past Medical History:  Diagnosis Date  . Abnormal Pap smear    history of colposcopy ECC with atypical squamous metaplasia  . Anxiety   . Asthma   . Congenital hearing loss    started age 6  . DM (diabetes mellitus) ([place])   . Endometriosis 10/10  . Hx of migraines    increased frequency with estrogen containing OCPs  . IUD (intrauterine device) in place   . Panic attacks   . Recurrent upper respiratory infection (URI)    Past Surgical History:  Procedure Laterality Date  . BREAST REDUCTION SURGERY  9/10  . COCHLEAR IMPLANT Left 2013   moval of Envoy device  . IMPLANTATION OF ENVOY ESTEEM HEARING DEVICE     for hearing loss  . TUBAL LIGATION  10/10   cautery of endometriosis   Social History   Social History  . Marital status: Divorced    Spouse name: N/A  . Number of children: 1  . Years of education: N/A   Occupational History  . [employer] Planner    Social History Main Topics  .  Smoking status: Never Smoker  . Smokeless tobacco: Never Used  . Alcohol use 0.0 oz/week     Comment: socially  . Drug use: No  . Sexual activity: Yes    Partners: Male    Birth control/ protection: Surgical     Comment: BTL   Other Topics Concern  . None   Social History Narrative   Divorced 1 child   Optometrist - [employer]   Family History  Problem Relation Age of Onset  . Deafness Mother   . Diabetes Mother   . Heart Problems Mother     tachycardia  . Ovarian cancer Mother 62  . Deafness Father   . Hypertension Father   . Cirrhosis Father     non alcoholic  . COPD Father   . Hyperlipidemia Father   . Down syndrome Sister   . Diabetes Paternal Grandmother   .  Allergic rhinitis Neg Hx   . Angioedema Neg Hx   . Asthma Neg Hx   . Eczema Neg Hx   . Immunodeficiency Neg Hx   . Urticaria Neg Hx     Review of Systems As per HPI, all other ROS negative  Objective:   Physical Exam @BP  100/68 (BP Location: Left Arm, Patient Position: Sitting, Cuff Size: Normal)   Pulse 72   Ht 5' 5.5" (1.664 m) Comment: height measured without shoes  Wt 159 lb (72.1 kg)   BMI 26.06 kg/m @  General:  Well-developed, well-nourished and in no acute distress Eyes:  anicteric. ENT:   Mouth and posterior pharynx free of lesions.  Neck:   supple w/o thyromegaly or mass.  Lungs: Clear to auscultation bilaterally. Heart:  S1S2, no rubs, murmurs, gallops. Abdomen:  soft, non-tender, no hepatosplenomegaly, hernia, or mass and BS+.  Lymph:  no cervical or supraclavicular adenopathy. Extremities:   no edema, cyanosis or clubbing Skin   no rash. Neuro:  A&O x 3.  Psych:  appropriate mood and  Affect.

## 2016-09-03 ENCOUNTER — Telehealth: Payer: Self-pay | Admitting: Internal Medicine

## 2016-09-03 NOTE — Telephone Encounter (Signed)
Patient notified  She will call back for any additional questions or concerns 

## 2016-09-03 NOTE — Telephone Encounter (Signed)
Left message for patient to call back  

## 2016-09-03 NOTE — Telephone Encounter (Signed)
Patient reports that she is having HA since the zantac. She took zantac this am and within 30 minutes she had a HA. Please advise alternative.

## 2016-09-03 NOTE — Telephone Encounter (Signed)
Have her stop that and if ondansetron alone not helping then can try OTC Prilosec, Nexium, Prevacid (generics ok) on a daily basis before breakfast

## 2016-09-05 ENCOUNTER — Telehealth: Payer: Self-pay | Admitting: *Deleted

## 2016-09-05 ENCOUNTER — Encounter: Payer: Self-pay | Admitting: Obstetrics & Gynecology

## 2016-09-05 MED ORDER — VILAZODONE HCL 10 MG PO TABS: ORAL_TABLET | ORAL | 1 refills | Status: DC

## 2016-09-05 NOTE — Telephone Encounter (Signed)
Rx sent to pharmacy.  Routing to provider for final review. Patient is agreeable to disposition. Will close encounter.

## 2016-09-05 NOTE — Telephone Encounter (Signed)
See telephone encounter dated 09/05/16.

## 2016-09-05 NOTE — Telephone Encounter (Signed)
I would follow the previously outlined dosing of 10 mg daily for one week and then increase to 20 mg daily.  Rx for 2 months total. Thanks for setting up the appointment for a recheck with Dr. Sabra Heck.

## 2016-09-05 NOTE — Telephone Encounter (Signed)
Spoke with patient. Patient states discussed with Dr. Sabra Heck at 08/09/16 OV coming off of Xanax and switching to something long-term. Patient states she has been on Vibryyd in the past, prescribed by pcp, would like to restart. Patient reports taking 1/2 Xanax daily currently. Advised patient Dr. Sabra Heck is out of the office this week, will review with covering provider and return call with recommendations, patient is agreeable.  From [facility] To Megan Salon, MD Sent 09/05/2016 10:12 AM  Hi. Looks like I'm going to need the vibryyd.  I'm taking 1/2 a xanax a day now.  I know you didn't want to refill it.  Just fyi.   Thanks!  Jonnette    Dr. Quincy Simmonds, please review and advise?  Cc: Dr. Sabra Heck

## 2016-09-05 NOTE — Telephone Encounter (Signed)
I am happy to prescribe the Viibryd for the patient in Dr. Ammie Ferrier absence.  I reviewed Dr. Ammie Ferrier last office visit with the patient.  Please let me know how she dosed this in the past.  Did she start with 10 mg x 7 days, and then increase to the 20 mg daily? (This is called the convenience pack.) I would give the patient 2 months worth of prescription.  I would recommend she have follow up with Dr. Sabra Heck in 6 weeks.  Patient needs to be watchful for any signs of increased depression after starting the medication.   Cc- Dr. Sabra Heck

## 2016-09-05 NOTE — Telephone Encounter (Signed)
Spoke with patient, advised as seen below per Dr. Quincy Simmonds. Patient states she does not recall what dosage she started with, "probably the lowest dosage". Patient scheduled for 6 week f/u with Dr. Sabra Heck 10/23/16 at 3pm. Advised patient would update Dr. Quincy Simmonds of dosing and send prescription to pharmacy on file. Patient is agreeable.  Dr. Quincy Simmonds -ok to send prescription for Viibryd as previously recommended?

## 2016-09-06 ENCOUNTER — Telehealth: Payer: Self-pay | Admitting: Internal Medicine

## 2016-09-06 MED ORDER — PROMETHAZINE HCL 12.5 MG PO TABS: 12.5000 mg | ORAL_TABLET | Freq: Four times a day (QID) | ORAL | 1 refills | Status: DC | PRN

## 2016-09-06 NOTE — Telephone Encounter (Signed)
Ondansetron may have been causing some agitation and increased anxiety Will try promethazine 12.5 mg prn  Discussed potential for sedation

## 2016-09-06 NOTE — Telephone Encounter (Signed)
Patient feels that the zofran is causing her worsening anxiety.  Her GYN has prescribed a antidepressant, but pharmacist has warned her that could interact with her zofran and worsen the anxiety.  Is there an alternative to zofran? She tried not taking it yesterday, but had terrible nausea all day. Please advise

## 2016-09-28 ENCOUNTER — Other Ambulatory Visit: Payer: Self-pay | Admitting: Allergy and Immunology

## 2016-09-28 MED ORDER — BECLOMETHASONE DIPROP HFA 80 MCG/ACT IN AERB
1.0000 | INHALATION_SPRAY | Freq: Two times a day (BID) | RESPIRATORY_TRACT | 5 refills | Status: DC
Start: 2016-09-28 — End: 2016-11-09

## 2016-09-28 NOTE — Telephone Encounter (Signed)
Spoke with pt. She states that the pharmacy did not have the redihaler rx. Called the pharmacy, and when they ran it through, insurance did not cover. Gave a verbal order for Flovent 110 mcg. Left message to make pt aware.

## 2016-09-28 NOTE — Telephone Encounter (Signed)
patient needs a refill on her QVAR sent to her pharmacy. Patient states she called her pharmacy and they had told her that they had sent over a refill request but had not had a response.  The patients was calling to follow up in case there were any concerns

## 2016-09-28 NOTE — Telephone Encounter (Signed)
Qvar redihaler sent to pharmacy.  

## 2016-09-28 NOTE — Telephone Encounter (Signed)
Patient called and said she is trying to pick up Qvar at her pharmacy and it has been discontinued and she needs a prescription for whatever they are replacing it with. She said her pharmacy will not fill without a prescription. She said she cannot breathe without it.

## 2016-10-04 ENCOUNTER — Ambulatory Visit: Payer: 59 | Admitting: Internal Medicine

## 2016-10-08 DIAGNOSIS — Z0000 Encounter for general adult medical examination without abnormal findings: Secondary | ICD-10-CM | POA: Diagnosis not present

## 2016-10-08 DIAGNOSIS — Z13220 Encounter for screening for lipoid disorders: Secondary | ICD-10-CM | POA: Diagnosis not present

## 2016-10-08 DIAGNOSIS — E559 Vitamin D deficiency, unspecified: Secondary | ICD-10-CM | POA: Diagnosis not present

## 2016-10-15 DIAGNOSIS — J45990 Exercise induced bronchospasm: Secondary | ICD-10-CM | POA: Diagnosis not present

## 2016-10-15 DIAGNOSIS — R197 Diarrhea, unspecified: Secondary | ICD-10-CM | POA: Diagnosis not present

## 2016-10-15 DIAGNOSIS — Z0000 Encounter for general adult medical examination without abnormal findings: Secondary | ICD-10-CM | POA: Diagnosis not present

## 2016-10-18 ENCOUNTER — Telehealth: Payer: Self-pay | Admitting: Obstetrics & Gynecology

## 2016-10-18 NOTE — Telephone Encounter (Signed)
Patient called and cancelled her appointment on 10/23/16 with Dr. Sabra Heck for a "med recheck" because she is not taking the medication. Patient did not report any current issues or concerns.  See telephone note from 09/05/16 for reference.

## 2016-10-23 ENCOUNTER — Ambulatory Visit: Payer: Self-pay | Admitting: Obstetrics & Gynecology

## 2016-11-07 ENCOUNTER — Other Ambulatory Visit: Payer: Self-pay | Admitting: Allergy and Immunology

## 2016-11-07 MED ORDER — FLUTICASONE PROPIONATE 50 MCG/ACT NA SUSP: 1.0000 | Freq: Every day | NASAL | 5 refills | Status: DC

## 2016-11-07 MED ORDER — AZELASTINE HCL 0.1 % NA SOLN: 2.0000 | Freq: Two times a day (BID) | NASAL | 5 refills | Status: DC

## 2016-11-07 NOTE — Telephone Encounter (Signed)
Patient last saw Dr. Verlin Fester on 08-28-16. She was on Azelastine which she was prescribed at [facility], and he told her to continue that, but she is out of refills, so she would like that filled. She was also on Fluticasone, and he told her she could discontinue that, but she said she can't breathe without it and would like that called in too. [facility].

## 2016-11-07 NOTE — Telephone Encounter (Signed)
Medication sent in and patient aware

## 2016-11-09 ENCOUNTER — Ambulatory Visit (INDEPENDENT_AMBULATORY_CARE_PROVIDER_SITE_OTHER): Payer: 59 | Admitting: Allergy

## 2016-11-09 ENCOUNTER — Encounter: Payer: Self-pay | Admitting: Allergy

## 2016-11-09 VITALS — BP 130/80 | HR 76 | Temp 98.5°F | Resp 12

## 2016-11-09 DIAGNOSIS — J019 Acute sinusitis, unspecified: Secondary | ICD-10-CM | POA: Diagnosis not present

## 2016-11-09 DIAGNOSIS — J3089 Other allergic rhinitis: Secondary | ICD-10-CM

## 2016-11-09 DIAGNOSIS — T7800XD Anaphylactic reaction due to unspecified food, subsequent encounter: Secondary | ICD-10-CM | POA: Diagnosis not present

## 2016-11-09 DIAGNOSIS — J4530 Mild persistent asthma, uncomplicated: Secondary | ICD-10-CM

## 2016-11-09 MED ORDER — METHYLPREDNISOLONE ACETATE 80 MG/ML IJ SUSP
80.0000 mg | Freq: Once | INTRAMUSCULAR | Status: AC
Start: 2016-11-09 — End: 2016-11-09
  Administered 2016-11-09: 80 mg via INTRAMUSCULAR

## 2016-11-09 MED ORDER — ALBUTEROL SULFATE HFA 108 (90 BASE) MCG/ACT IN AERS: 2.0000 | INHALATION_SPRAY | RESPIRATORY_TRACT | 5 refills | Status: DC | PRN

## 2016-11-09 MED ORDER — OLOPATADINE HCL 0.2 % OP SOLN: 1.0000 [drp] | Freq: Every day | OPHTHALMIC | 5 refills | Status: DC

## 2016-11-09 NOTE — Progress Notes (Signed)
Follow-up Note  RE: Margaret Ford MRN: [id number] DOB: 27-Jul-1975 Date of Office Visit: 11/09/2016   History of present illness: Margaret Ford is a 41 y.o. female presenting today for sick visit. She was last seen by Dr. Verlin Fester on 08/28/2016 at which time she had skin testing that showed sensitivity to molds and dust mite. Her food allergy testing for fish and shellfish was negative. She presents today as she has been having 2 days worth of increased nasal congestion and drainage as well as watery eyes with swelling. She also endorses chest tightness and hoarseness. She states she gets these similar symptoms every year around this time and feels that it is related to pollen exposure. She states she was out playing tennis on Wednesday and afterwards she started to feel sick.  She would like to go on allergy shots to see if she can prevent getting these symptoms every year. She has been using her recommended medications of Flovent 1 puff twice a day, azelastine nasal spray as well as saline spray. She also has been using a Mucinex nasal spray that she states does help open up her nose that she can breathe better. She also has been taking Allegra-D.  She states she has not been using her Proair respiclick as she does not feel that she is either using it properly or getting the medication as she should.  She reports that when she uses she does not feel relief of her symptoms. She does recall using the aerosolized albuterol inhaler with relief of her symptoms but she does not have any more of this type of albuterol inhaler. She denies any fevers.  Review of systems: Review of Systems  Constitutional: Negative for chills, fever and malaise/fatigue.  HENT: Positive for congestion, ear pain, sinus pain and sore throat. Negative for ear discharge, hearing loss, nosebleeds and tinnitus.   Eyes: Positive for redness. Negative for pain and discharge.  Respiratory: Positive for cough and shortness of breath.  Negative for wheezing.   Cardiovascular: Negative for chest pain.  Gastrointestinal: Negative for abdominal pain, diarrhea, heartburn, nausea and vomiting.  Musculoskeletal: Negative for joint pain and myalgias.  Skin: Negative for itching and rash.  Neurological: Negative for headaches.    All other systems negative unless noted above in HPI  Past medical/social/surgical/family history have been reviewed and are unchanged unless specifically indicated below.  No changes  Medication List: Allergies as of 11/09/2016      Reactions   Augmentin [amoxicillin-pot Clavulanate]    Ciprofloxacin    Codeine Other (See Comments)   "sick"   Shrimp [shellfish Allergy]       Medication List       Accurate as of 11/09/16  3:35 PM. Always use your most recent med list.          albuterol 108 (90 Base) MCG/ACT inhaler Commonly known as:  VENTOLIN HFA Inhale 2 puffs into the lungs every 4 (four) hours as needed for wheezing or shortness of breath.   ALLEGRA PO Take by mouth as needed.   ALPRAZolam 0.5 MG tablet Commonly known as:  XANAX Take 1 tablet (0.5 mg total) by mouth 2 (two) times daily as needed for anxiety.   azelastine 0.1 % nasal spray Commonly known as:  ASTELIN Place 2 sprays into both nostrils 2 (two) times daily.   ELDERBERRY PO Take by mouth daily.   fluticasone 110 MCG/ACT inhaler Commonly known as:  FLOVENT HFA Inhale 1 puff into the lungs 2 (two)  times daily.   fluticasone 50 MCG/ACT nasal spray Commonly known as:  FLONASE Place 1 spray into both nostrils daily.   MULTIVITAMIN GUMMIES ADULT PO Take by mouth daily. Super food   Olopatadine HCl 0.2 % Soln Commonly known as:  PATADAY Place 1 drop into both eyes daily.   ondansetron 4 MG tablet Commonly known as:  ZOFRAN Take 1 tablet (4 mg total) by mouth every 8 (eight) hours as needed for nausea or vomiting.   promethazine 12.5 MG tablet Commonly known as:  PHENERGAN Take 1 tablet (12.5 mg total) by  mouth every 6 (six) hours as needed for nausea or vomiting.   Vilazodone HCl 10 MG Tabs Commonly known as:  VIIBRYD Take 10 mg daily for one week and then increase to 20 mg daily.   VITAMIN D PO Take 2,000 Int'l Units by mouth daily.       Known medication allergies: Allergies  Allergen Reactions  . Augmentin [Amoxicillin-Pot Clavulanate]   . Ciprofloxacin   . Codeine Other (See Comments)    "sick"  . Shrimp [Shellfish Allergy]      Physical examination: Blood pressure 130/80, pulse 76, temperature 98.5 F (36.9 C), temperature source Tympanic, resp. rate 12.  General: Alert, interactive, in no acute distress. HEENT: PERRLA, TMs pearly gray, turbinates moderately edematous and erythematous with clear discharge, post-pharynx non erythematous. No significant TTP Neck: Supple without lymphadenopathy. Lungs: Clear to auscultation without wheezing, rhonchi or rales. {no increased work of breathing.   Pt doe endorse chest tightness relieved by duoneb in office CV: Normal S1, S2 without murmurs. Abdomen: Nondistended, nontender. Skin: Warm and dry, without lesions or rashes. Extremities:  No clubbing, cyanosis or edema. Neuro:   Grossly intact.  Diagnositics/Labs:  Spirometry: FEV1: 3.09L  108%, FVC: 3.78L  110%, ratio consistent with Nonobstructive pattern.  She was provided with a DuoNeb due to subjective findings of chest tightness. She had improvement in her chest tightness following this administration. Her lung exam remained unchanged. Her spirometry had no significant bronchodilator response.  Assessment and plan:   Rhinosinusitis, allergic vs viral  Increased nasal congestion/drainage + ocular symptoms  Recommend continued use of nasal saline daily  Continue Azelastine nasal spray, 2 sprays per nostril twice a day.  May continue Allegra D while acutely sick then once better change back to plain Allegra.    Provided with depomedrol 80mg  injection in office  At  this time do not feel you have a bacterial infection given short duration of symptoms and lack of fever.  Let us know if your symptoms do not improve by early next week.      Allergic rhinitis  Continue aeroallergen avoidance measures   As above Azelastine nasal spray, 2 sprays per nostril twice a day.  I have also recommended nasal saline spray (i.e., Simply Saline) or nasal saline lavage (i.e., NeilMed) as needed prior to medicated nasal sprays.  Use Pataday 1 drop each eye as needed for itchy, watery, red eyes  Will obtain environmental allergen panel to confirm you do not have any pollen sensitivity  History of food allergy Ebba's history suggests shellfish and fish allergy.  However, all food allergen skin tests were negative despite a positive histamine control.  The negative predictive value of food allergen skin testing is excellent, though there is still 5% chance that the allergy exists.    Laboratory order form has been provided for serum specific IgE against shellfish panel and fish panel.  If laboratory results are negative,  we will proceed with open graded oral challenges.  Until food allergy has been definitively ruled out, she is to continue careful avoidance of fish and shellfish.  Mild persistent asthma Well-controlled on current treatment plan.  Continue Flovent 110g, 2 inhalation via spacer device twice a day, and albuterol every 4-6 hours as needed (will prescribe an MDI to replace your Respiclick).    During respiratory tract infections or asthma flares, increase Flovent 110g to 3 inhalations 2 times per day until symptoms have returned to baseline.  Subjective and objective measures of pulmonary function will be followed and the treatment plan will be adjusted accordingly.  Follow-up with Dr. Verlin Fester as previously advised pending blood work   I appreciate the opportunity to take part in [place] care. Please do not hesitate to contact me with  questions.  Sincerely,   Prudy Feeler, MD Allergy/Immunology [facility]

## 2016-11-09 NOTE — Patient Instructions (Addendum)
Sinusitis  Increased nasal congestion/drainage + ocular symptoms  Recommend continued use of nasal saline daily  Continue Azelastine nasal spray, 2 sprays per nostril twice a day.  May continue Allegra D while acutely sick then once better change back to plain Allegra.    Provided with depomedrol injection in office  At this time do not feel you have a bacterial infection given short duration of symptoms and lack of fever.  Let us know if your symptoms do not improve by early next week.      Allergic rhinitis  Continue aeroallergen avoidance measures   As above Azelastine nasal spray, 2 sprays per nostril twice a day.  I have also recommended nasal saline spray (i.e., Simply Saline) or nasal saline lavage (i.e., NeilMed) as needed prior to medicated nasal sprays.  Use Pataday 1 drop each eye as needed for itchy, watery, red eyes  Will obtain environmental allergen panel to confirm you do not have any pollen sensitivity  History of food allergy Karleen's history suggests shellfish and fish allergy.  However, all food allergen skin tests were negative despite a positive histamine control.  The negative predictive value of food allergen skin testing is excellent, though there is still 5% chance that the allergy exists.    Laboratory order form has been provided for serum specific IgE against shellfish panel and fish panel.  If laboratory results are negative, we will proceed with open graded oral challenges.  Until food allergy has been definitively ruled out, she is to continue careful avoidance of fish and shellfish.  Mild persistent asthma Well-controlled on current treatment plan.  Continue Flovent 110g, 2 inhalation via spacer device twice a day, and albuterol every 4-6 hours as needed (will prescribe an aerosol albuterol inhaler to replace your Respiclick).    During respiratory tract infections or asthma flares, increase Flovent 110g to 3 inhalations 2 times per day until  symptoms have returned to baseline.  Subjective and objective measures of pulmonary function will be followed and the treatment plan will be adjusted accordingly.  Follow-up with Dr. Verlin Fester as previously advised pending blood work

## 2016-11-12 LAB — CP584 ZONE 3
Allergen, A. alternata, m6: <0.1 kU/L
Allergen, Black Locust, Acacia9: <0.1 kU/L
Allergen, C. Herbarum, M2: <0.1 kU/L
Allergen, Cedar tree, t12: <0.1 kU/L
Allergen, Comm Silver Birch, t9: <0.1 kU/L
Allergen, Mucor Racemosus, M4: <0.1 kU/L
Allergen, Mulberry, t76: <0.1 kU/L
Allergen, P. notatum, m1: <0.1 kU/L
Allergen, S. Botryosum, m10: <0.1 kU/L
Aspergillus fumigatus, m3: <0.1 kU/L
Bahia Grass: <0.1 kU/L
Box Elder IgE: <0.1 kU/L
Cat Dander: <0.1 kU/L
Cockroach: <0.1 kU/L
Common Ragweed: <0.1 kU/L
D. FARINAE: 0.11 kU/L — AB
Dog Dander: <0.1 kU/L
Elm IgE: <0.1 kU/L
Johnson Grass: <0.1 kU/L
Meadow Grass: <0.1 kU/L
Pecan/Hickory Tree IgE: <0.1 kU/L
Plantain: <0.1 kU/L
Rough Pigweed  IgE: <0.1 kU/L

## 2016-11-12 LAB — ALLERGY-SHELLFISH PANEL
Clams: <0.1 kU/L
Crab: <0.1 kU/L
Lobster: <0.1 kU/L
Shrimp IgE: <0.1 kU/L

## 2016-11-15 LAB — CP658 FISH PANEL
Allergen, Flounder, Rf337: <0.1 kU/L
Allergen, Salmon, f41: <0.1 kU/L
Allergen, Trout, f204: <0.1 kU/L
Allergen,Halibut,Rf303: <0.1 kU/L
Allergen,Mackerel,Rf206: <0.1 kU/L
Fish Cod: <0.1 kU/L
Tuna IgE: <0.1 kU/L

## 2016-11-19 ENCOUNTER — Ambulatory Visit (INDEPENDENT_AMBULATORY_CARE_PROVIDER_SITE_OTHER): Payer: 59 | Admitting: Allergy and Immunology

## 2016-11-19 ENCOUNTER — Encounter: Payer: Self-pay | Admitting: Allergy and Immunology

## 2016-11-19 VITALS — BP 102/60 | HR 74 | Resp 16

## 2016-11-19 DIAGNOSIS — J3089 Other allergic rhinitis: Secondary | ICD-10-CM | POA: Diagnosis not present

## 2016-11-19 DIAGNOSIS — J4531 Mild persistent asthma with (acute) exacerbation: Secondary | ICD-10-CM

## 2016-11-19 DIAGNOSIS — R430 Anosmia: Secondary | ICD-10-CM | POA: Insufficient documentation

## 2016-11-19 DIAGNOSIS — T7800XD Anaphylactic reaction due to unspecified food, subsequent encounter: Secondary | ICD-10-CM

## 2016-11-19 NOTE — Assessment & Plan Note (Addendum)
Skin testing and blood work reveal perennial allergy rhinitis however the history suggests pollen allergy. Either she is experiencing non-allergic irritation with pollen exposure or this may represent local allergic rhinitis.  She will discuss with her ENT on Wednesday if it is acceptable for her to use this or Qnasl with her cochlear implant. If so, one of these medications will be prescribed.  Continue nasal saline spray as needed and prior to medicated nasal sprays.   Continue to use Mucinex or Mucinex-D as needed. Pseudoephedrine is only to be used on a short-term basis.

## 2016-11-19 NOTE — Assessment & Plan Note (Signed)
The patient's history of anosmia, temporarily relieved with prednisone, suggest the possibility of nasal polyposis.  She will be seeing her otolaryngologist in 2 days and so I will defer evaluation to that visit.  If she does have nasal polyps, Xhance or Qnasl are good options.

## 2016-11-19 NOTE — Assessment & Plan Note (Addendum)
   For now, and during respiratory tract infections or asthma flares, increase Flovent 110g to 3 inhalations 3 times per day. Once symptoms return to baseline she may resume 2 inhalations via spacer device twice a day.  Should symptoms persist progress, she may take prednisone 20 mg x 4 days, 10 mg x1 day, then stop. Prednisone pack has been provided.  Subjective and objective measures of pulmonary function will be followed and the treatment plan will be adjusted accordingly.

## 2016-11-19 NOTE — Assessment & Plan Note (Signed)
The patient's history suggests shellfish and fish allergy.  However, all food allergen skin tests were negative despite a positive histamine control and serum specific IgE against fish and shellfish panel were negative.     We will proceed with open graded oral challenges.  Until food allergy has been definitively ruled out, she is to continue meticulous avoidance of fish and shellfish.

## 2016-11-19 NOTE — Patient Instructions (Addendum)
Allergic rhinitis Skin testing and blood work reveal perennial allergy rhinitis however the history suggests pollen allergy. Either she is experiencing non-allergic irritation with pollen exposure or this may represent local allergic rhinitis.  She will discuss with her ENT on Wednesday if it is acceptable for her to use this or Qnasl with her cochlear implant. If so, one of these medications will be prescribed.  Continue nasal saline spray as needed and prior to medicated nasal sprays.   Continue to use Mucinex or Mucinex-D as needed. Pseudoephedrine is only to be used on a short-term basis.  Mild persistent asthma  For now, and during respiratory tract infections or asthma flares, increase Flovent 110g to 3 inhalations 3 times per day. Once symptoms return to baseline she may resume 2 inhalations via spacer device twice a day.  Should symptoms persist progress, she may take prednisone 20 mg x 4 days, 10 mg x1 day, then stop. Prednisone pack has been provided.  Subjective and objective measures of pulmonary function will be followed and the treatment plan will be adjusted accordingly.  History of food allergy The patient's history suggests shellfish and fish allergy.  However, all food allergen skin tests were negative despite a positive histamine control and serum specific IgE against fish and shellfish panel were negative.     We will proceed with open graded oral challenges.  Until food allergy has been definitively ruled out, she is to continue meticulous avoidance of fish and shellfish.  Anosmia The patient's history of anosmia, temporarily relieved with prednisone, suggest the possibility of nasal polyposis.  She will be seeing her otolaryngologist in 2 days and so I will defer evaluation to that visit.  If she does have nasal polyps, Xhance or Qnasl are good options.   Return for open graded oral challenge to shrimp.

## 2016-11-19 NOTE — Progress Notes (Signed)
Follow-up Note  RE: [name] MRN: [id number] DOB: [birth year] Date of Office Visit: 11/19/2016  Primary care provider: [name], MD Referring provider: [name], MD  History of present illness: Margaret Ford is a 41 y.o. female with perennial allergic rhinitis and history of food allergy presenting today for follow up.  She was last seen in this clinic on 11/09/2016 by Dr. Nelva Bush.  She reports that when she goes outdoors in the springtime her upper and lower rest or symptoms become exacerbated.  She experiences nasal congestion, postnasal drainage, hoarseness, sinus pressure, ocular pruritus, coughing, and dyspnea.  Her skin tests revealed reactivity to molds and dust mite antigen but were negative to pollen.  A serum specific IgE against environmental panel was ordered during her last visit to double check to skin test results, however the lab results were negative to pollens as well.  She is frustrated because she sees such a direct correlation between pollen exposure in her symptoms.  She reports diminished sense of taste and smell.  She experienced relief from this problem while taking prednisone, however the anosmia resumed shortly thereafter.    Assessment and plan: Allergic rhinitis Skin testing and blood work reveal perennial allergy rhinitis however the history suggests pollen allergy. Either she is experiencing non-allergic irritation with pollen exposure or this may represent local allergic rhinitis.  She will discuss with her ENT on Wednesday if it is acceptable for her to use this or Qnasl with her cochlear implant. If so, one of these medications will be prescribed.  Continue nasal saline spray as needed and prior to medicated nasal sprays.   Continue to use Mucinex or Mucinex-D as needed. Pseudoephedrine is only to be used on a short-term basis.  Mild persistent asthma  For now, and during respiratory tract infections or asthma flares, increase  Flovent 110g to 3 inhalations 3 times per day. Once symptoms return to baseline she may resume 2 inhalations via spacer device twice a day.  Should symptoms persist progress, she may take prednisone 20 mg x 4 days, 10 mg x1 day, then stop. Prednisone pack has been provided.  Subjective and objective measures of pulmonary function will be followed and the treatment plan will be adjusted accordingly.  History of food allergy The patient's history suggests shellfish and fish allergy.  However, all food allergen skin tests were negative despite a positive histamine control and serum specific IgE against fish and shellfish panel were negative.     We will proceed with open graded oral challenges.  Until food allergy has been definitively ruled out, she is to continue meticulous avoidance of fish and shellfish.  Anosmia The patient's history of anosmia, temporarily relieved with prednisone, suggest the possibility of nasal polyposis.  She will be seeing her otolaryngologist in 2 days and so I will defer evaluation to that visit.  If she does have nasal polyps, Xhance or Qnasl are good options.   Diagnostics: Spirometry:  Normal with an FEV1 of 100% predicted.  Please see scanned spirometry results for details.    Physical examination: Blood pressure 102/60, pulse 74, resp. rate 16.  General: Alert, interactive, in no acute distress. HEENT: TMs pearly gray, turbinates moderately edematous with thick discharge, post-pharynx moderately erythematous. Neck: Supple without lymphadenopathy. Lungs: Clear to auscultation without wheezing, rhonchi or rales. CV: Normal S1, S2 without murmurs. Skin: Warm and dry, without lesions or rashes.  The following portions of the patient's history were reviewed and updated as appropriate: allergies, current medications,  past family history, past medical history, past social history, past surgical history and problem list.  Allergies as of 11/19/2016       Reactions   Augmentin [amoxicillin-pot Clavulanate]    Ciprofloxacin    Codeine Other (See Comments)   "sick"   Shrimp [shellfish Allergy]       Medication List       Accurate as of 11/19/16  1:15 PM. Always use your most recent med list.          albuterol 108 (90 Base) MCG/ACT inhaler Commonly known as:  VENTOLIN HFA Inhale 2 puffs into the lungs every 4 (four) hours as needed for wheezing or shortness of breath.   ALLEGRA PO Take by mouth as needed.   ALPRAZolam 0.5 MG tablet Commonly known as:  XANAX Take 1 tablet (0.5 mg total) by mouth 2 (two) times daily as needed for anxiety.   azelastine 0.1 % nasal spray Commonly known as:  ASTELIN Place 2 sprays into both nostrils 2 (two) times daily.   ELDERBERRY PO Take by mouth daily.   fluticasone 110 MCG/ACT inhaler Commonly known as:  FLOVENT HFA Inhale 1 puff into the lungs 2 (two) times daily.   fluticasone 50 MCG/ACT nasal spray Commonly known as:  FLONASE Place 1 spray into both nostrils daily.   MULTIVITAMIN GUMMIES ADULT PO Take by mouth daily. Super food   Olopatadine HCl 0.2 % Soln Commonly known as:  PATADAY Place 1 drop into both eyes daily.   ondansetron 4 MG tablet Commonly known as:  ZOFRAN Take 1 tablet (4 mg total) by mouth every 8 (eight) hours as needed for nausea or vomiting.   promethazine 12.5 MG tablet Commonly known as:  PHENERGAN Take 1 tablet (12.5 mg total) by mouth every 6 (six) hours as needed for nausea or vomiting.   Vilazodone HCl 10 MG Tabs Commonly known as:  VIIBRYD Take 10 mg daily for one week and then increase to 20 mg daily.   VITAMIN D PO Take 2,000 Int'l Units by mouth daily.       Allergies  Allergen Reactions  . Augmentin [Amoxicillin-Pot Clavulanate]   . Ciprofloxacin   . Codeine Other (See Comments)    "sick"  . Shrimp [Shellfish Allergy]    Review of systems: Review of systems negative except as noted in HPI / PMHx or noted below: Constitutional:  Negative.  HENT: Negative.   Eyes: Negative.  Respiratory: Negative.   Cardiovascular: Negative.  Gastrointestinal: Negative.  Genitourinary: Negative.  Musculoskeletal: Negative.  Neurological: Negative.  Endo/Heme/Allergies: Negative.  Cutaneous: Negative.  Past Medical History:  Diagnosis Date  . Abnormal Pap smear    history of colposcopy ECC with atypical squamous metaplasia  . Anxiety   . Asthma   . Congenital hearing loss    started age 45  . DM (diabetes mellitus) ([place])   . Endometriosis 10/10  . Hx of migraines    increased frequency with estrogen containing OCPs  . IUD (intrauterine device) in place   . Panic attacks   . Recurrent upper respiratory infection (URI)     Family History  Problem Relation Age of Onset  . Deafness Mother   . Diabetes Mother   . Heart Problems Mother        tachycardia  . Ovarian cancer Mother 9  . Deafness Father   . Hypertension Father   . Cirrhosis Father        non alcoholic  . COPD Father   .  Hyperlipidemia Father   . Down syndrome Sister   . Diabetes Paternal Grandmother   . Allergic rhinitis Neg Hx   . Angioedema Neg Hx   . Asthma Neg Hx   . Eczema Neg Hx   . Immunodeficiency Neg Hx   . Urticaria Neg Hx     Social History   Social History  . Marital status: Divorced    Spouse name: N/A  . Number of children: 1  . Years of education: N/A   Occupational History  . [employer]    Social History Main Topics  . Smoking status: Never Smoker  . Smokeless tobacco: Never Used  . Alcohol use 0.0 oz/week     Comment: socially  . Drug use: No  . Sexual activity: Yes    Partners: Male    Birth control/ protection: Surgical     Comment: BTL   Other Topics Concern  . Not on file   Social History Narrative   Divorced 1 child   [place]    I appreciate the opportunity to take part in [place] care. Please do not hesitate to contact me with  questions.  Sincerely,   R. Edgar Frisk, MD

## 2016-11-20 DIAGNOSIS — H6011 Cellulitis of right external ear: Secondary | ICD-10-CM | POA: Diagnosis not present

## 2016-11-20 DIAGNOSIS — H903 Sensorineural hearing loss, bilateral: Secondary | ICD-10-CM | POA: Diagnosis not present

## 2016-11-20 DIAGNOSIS — H60591 Other noninfective acute otitis externa, right ear: Secondary | ICD-10-CM | POA: Diagnosis not present

## 2016-11-21 ENCOUNTER — Telehealth: Payer: Self-pay | Admitting: Allergy and Immunology

## 2016-11-21 MED ORDER — FLUTICASONE PROPIONATE 93 MCG/ACT NA EXHU: 2.0000 | INHALANT_SUSPENSION | Freq: Two times a day (BID) | NASAL | 5 refills | Status: DC

## 2016-11-21 NOTE — Telephone Encounter (Signed)
Please advise 

## 2016-11-21 NOTE — Telephone Encounter (Signed)
Patient went to see ENT Patient DOES NOT have nasal polyps Patient is ok to take the Eustace Pen if it is still needed considering she does not have polyps Please call patient to advise next course of action

## 2016-11-21 NOTE — Telephone Encounter (Signed)
Called patient. Left message informing that we sent in Gordon and that it will be mailed to her. I also said to call us back if any questions.

## 2016-11-21 NOTE — Telephone Encounter (Signed)
Yes, start Truett Perna - it will help with nasal congestion regardless of polyp status.

## 2016-11-23 ENCOUNTER — Telehealth: Payer: Self-pay

## 2016-11-23 NOTE — Telephone Encounter (Signed)
Pharmacy called in regards to clarification for [facility]. I spoke to Amberle in regards to if it was ok to dispense 2 bottles? With the instructions 2 sprays twice a day only will only last for 15 days with a 16 ml bottle. I confirmed that dispensing 2 bottles is ok.

## 2016-11-30 ENCOUNTER — Encounter: Payer: Self-pay | Admitting: Obstetrics & Gynecology

## 2016-11-30 ENCOUNTER — Other Ambulatory Visit (HOSPITAL_COMMUNITY)
Admission: RE | Admit: 2016-11-30 | Discharge: 2016-11-30 | Disposition: A | Discharge status: Home / Self Care | Payer: 59 | Source: Ambulatory Visit | Attending: Obstetrics & Gynecology | Admitting: Obstetrics & Gynecology

## 2016-11-30 ENCOUNTER — Ambulatory Visit (INDEPENDENT_AMBULATORY_CARE_PROVIDER_SITE_OTHER): Payer: 59 | Admitting: Obstetrics & Gynecology

## 2016-11-30 VITALS — BP 104/60 | HR 88 | Resp 16 | Ht 65.75 in | Wt 153.0 lb

## 2016-11-30 DIAGNOSIS — Z01419 Encounter for gynecological examination (general) (routine) without abnormal findings: Secondary | ICD-10-CM

## 2016-11-30 DIAGNOSIS — R87610 Atypical squamous cells of undetermined significance on cytologic smear of cervix (ASC-US): Secondary | ICD-10-CM | POA: Diagnosis not present

## 2016-11-30 DIAGNOSIS — Z8041 Family history of malignant neoplasm of ovary: Secondary | ICD-10-CM

## 2016-11-30 DIAGNOSIS — Z202 Contact with and (suspected) exposure to infections with a predominantly sexual mode of transmission: Secondary | ICD-10-CM | POA: Diagnosis present

## 2016-11-30 DIAGNOSIS — Z0000 Encounter for general adult medical examination without abnormal findings: Secondary | ICD-10-CM | POA: Diagnosis not present

## 2016-11-30 DIAGNOSIS — Z124 Encounter for screening for malignant neoplasm of cervix: Secondary | ICD-10-CM | POA: Insufficient documentation

## 2016-11-30 LAB — POCT URINALYSIS DIPSTICK
BILIRUBIN UA: NEGATIVE
Blood, UA: NEGATIVE
Glucose, UA: NEGATIVE
KETONES UA: NEGATIVE
Leukocytes, UA: NEGATIVE
Nitrite, UA: NEGATIVE
PROTEIN UA: NEGATIVE
Urobilinogen, UA: 0.2 E.U./dL
pH, UA: 5 (ref 5.0–8.0)

## 2016-11-30 NOTE — Progress Notes (Signed)
41 y.o. G2P1 DivorcedCaucasianF here for annual exam.  Doing well.  Going to [place] in August to visit a friend.  She is not having bleeding with her IUD.  This IUD was placed 12/16.    No LMP recorded. Patient is not currently having periods (Reason: IUD).          Sexually active: No.  The current method of family planning is tubal ligation/ Mirena IUD Inserted 06/24/15.    Exercising: Yes.    tennis, strength  Smoker:  no  Health Maintenance: Pap:  10/15/14 Neg   10/02/13 Neg  History of abnormal Pap:  Yes, h/o Colpo with ECC with atypical squamous metaplasia. MMG:  04/20/16 BIRADS1:Neg  Colonoscopy:  Never BMD:   Never TDaP:  09/2012   Pneumonia vaccine(s):  Done  Zostavax:   N/A Hep C testing: N/A Screening Labs: PCP, Urine today: Normal    reports that she has never smoked. She has never used smokeless tobacco. She reports that she drinks alcohol. She reports that she does not use drugs.  Past Medical History:  Diagnosis Date  . Abnormal Pap smear    history of colposcopy ECC with atypical squamous metaplasia  . Anxiety   . Asthma   . Congenital hearing loss    started age 81  . DM (diabetes mellitus) ([place])   . Endometriosis 10/10  . Hx of migraines    increased frequency with estrogen containing OCPs  . IUD (intrauterine device) in place   . Panic attacks   . Recurrent upper respiratory infection (URI)     Past Surgical History:  Procedure Laterality Date  . BREAST REDUCTION SURGERY  9/10  . COCHLEAR IMPLANT Left 2013   moval of Envoy device  . IMPLANTATION OF ENVOY ESTEEM HEARING DEVICE     for hearing loss  . TUBAL LIGATION  10/10   cautery of endometriosis    Current Outpatient Prescriptions  Medication Sig Dispense Refill  . albuterol (VENTOLIN HFA) 108 (90 Base) MCG/ACT inhaler Inhale 2 puffs into the lungs every 4 (four) hours as needed for wheezing or shortness of breath. 1 Inhaler 5  . ALPRAZolam (XANAX) 0.5 MG tablet Take 1 tablet (0.5 mg total)  by mouth 2 (two) times daily as needed for anxiety. 30 tablet 0  . azelastine (ASTELIN) 0.1 % nasal spray Place 2 sprays into both nostrils 2 (two) times daily. 30 mL 5  . Cholecalciferol (VITAMIN D PO) Take 2,000 Int'l Units by mouth daily.    Marland Kitchen ELDERBERRY PO Take by mouth daily.    Marland Kitchen Fexofenadine HCl (ALLEGRA PO) Take by mouth as needed.    . fluticasone (FLONASE) 50 MCG/ACT nasal spray Place 1 spray into both nostrils daily. 16 g 5  . fluticasone (FLOVENT HFA) 110 MCG/ACT inhaler Inhale 1 puff into the lungs 2 (two) times daily.    . Fluticasone Propionate (XHANCE) 93 MCG/ACT EXHU Place 2 puffs into both nostrils 2 (two) times daily. 16 mL 5  . Multiple Vitamins-Minerals (MULTIVITAMIN GUMMIES ADULT PO) Take by mouth daily. Super food    . Olopatadine HCl (PATADAY) 0.2 % SOLN Place 1 drop into both eyes daily. 1 Bottle 5  . ondansetron (ZOFRAN) 4 MG tablet Take 1 tablet (4 mg total) by mouth every 8 (eight) hours as needed for nausea or vomiting. 30 tablet 1  . promethazine (PHENERGAN) 12.5 MG tablet Take 1 tablet (12.5 mg total) by mouth every 6 (six) hours as needed for nausea or vomiting. 60 tablet  1   No current facility-administered medications for this visit.     Family History  Problem Relation Age of Onset  . Deafness Mother   . Diabetes Mother   . Heart Problems Mother        tachycardia  . Ovarian cancer Mother 71  . Deafness Father   . Hypertension Father   . Cirrhosis Father        non alcoholic  . COPD Father   . Hyperlipidemia Father   . Down syndrome Sister   . Diabetes Paternal Grandmother   . Allergic rhinitis Neg Hx   . Angioedema Neg Hx   . Asthma Neg Hx   . Eczema Neg Hx   . Immunodeficiency Neg Hx   . Urticaria Neg Hx     ROS:  Pertinent items are noted in HPI.  Otherwise, a comprehensive ROS was negative.  Exam:   BP 104/60 (BP Location: Right Arm, Patient Position: Sitting, Cuff Size: Normal)   Pulse 88   Resp 16   Ht 5' 5.75" (1.67 m)   Wt 153  lb (69.4 kg)   BMI 24.88 kg/m   Weight change: -16#   Height: 5' 5.75" (167 cm)  Ht Readings from Last 3 Encounters:  11/30/16 5' 5.75" (1.67 m)  08/30/16 5' 5.5" (1.664 m)  08/28/16 5\' 6"  (1.676 m)    General appearance: alert, cooperative and appears stated age Head: Normocephalic, without obvious abnormality, atraumatic Neck: no adenopathy, supple, symmetrical, trachea midline and thyroid normal to inspection and palpation Lungs: clear to auscultation bilaterally Breasts: normal appearance, no masses or tenderness Heart: regular rate and rhythm Abdomen: soft, non-tender; bowel sounds normal; no masses,  no organomegaly Extremities: extremities normal, atraumatic, no cyanosis or edema Skin: Skin color, texture, turgor normal. No rashes or lesions Lymph nodes: Cervical, supraclavicular, and axillary nodes normal. No abnormal inguinal nodes palpated Neurologic: Grossly normal   Pelvic: External genitalia:  no lesions              Urethra:  normal appearing urethra with no masses, tenderness or lesions              Bartholins and Skenes: normal                 Vagina: normal appearing vagina with normal color and discharge, no lesions              Cervix: no lesions, IUD string noted              Pap taken: Yes.   Bimanual Exam:  Uterus:  normal size, contour, position, consistency, mobility, non-tender              Adnexa: normal adnexa and no mass, fullness, tenderness               Rectovaginal: Confirms               Anus:  normal sphincter tone, no lesions  Chaperone was present for exam.  A:  Well Woman with normal exam Mirena IUD 12/16,  Family hx of ovarian cancer in mother (deceased) Family hx of pulmonary fibrosis 07/08/23, father deceased) Endometriosis diagnosed with laparoscopy 10/10 with BTL  P:   Mammogram guidelines reviewed. pap smear and HR HPV obtained today Gc/Chl obtained toay.  Hiv, RPR, and Hep B SAg obtained today Yearly PUS and ca-125 return  annually or prn

## 2016-12-01 LAB — STD PANEL
HIV 1&2 Ab, 4th Generation: NONREACTIVE
Hepatitis B Surface Ag: NEGATIVE

## 2016-12-04 LAB — CA 125: CA 125: 20 U/mL (ref <35)

## 2016-12-06 LAB — CYTOLOGY - PAP
CHLAMYDIA, DNA PROBE: NEGATIVE
Diagnosis: UNDETERMINED — AB
HPV (WINDOPATH): NOT DETECTED
NEISSERIA GONORRHEA: NEGATIVE

## 2016-12-31 DIAGNOSIS — H903 Sensorineural hearing loss, bilateral: Secondary | ICD-10-CM | POA: Diagnosis not present

## 2017-02-01 DIAGNOSIS — M542 Cervicalgia: Secondary | ICD-10-CM | POA: Diagnosis not present

## 2017-02-01 DIAGNOSIS — M5134 Other intervertebral disc degeneration, thoracic region: Secondary | ICD-10-CM | POA: Diagnosis not present

## 2017-02-01 DIAGNOSIS — M25511 Pain in right shoulder: Secondary | ICD-10-CM | POA: Diagnosis not present

## 2017-02-05 DIAGNOSIS — M25511 Pain in right shoulder: Secondary | ICD-10-CM | POA: Diagnosis not present

## 2017-02-05 DIAGNOSIS — M542 Cervicalgia: Secondary | ICD-10-CM | POA: Diagnosis not present

## 2017-02-05 DIAGNOSIS — M5134 Other intervertebral disc degeneration, thoracic region: Secondary | ICD-10-CM | POA: Diagnosis not present

## 2017-02-08 ENCOUNTER — Other Ambulatory Visit: Payer: Self-pay | Admitting: Obstetrics & Gynecology

## 2017-02-08 MED ORDER — ALPRAZOLAM 0.5 MG PO TABS: 0.5000 mg | ORAL_TABLET | Freq: Two times a day (BID) | ORAL | 0 refills | Status: DC | PRN

## 2017-02-08 NOTE — Telephone Encounter (Signed)
Prescription for xanax 0.5mg , #30, 0RF faxed to [facility]. Fax #: [phone].

## 2017-02-28 DIAGNOSIS — M25511 Pain in right shoulder: Secondary | ICD-10-CM | POA: Diagnosis not present

## 2017-02-28 DIAGNOSIS — M542 Cervicalgia: Secondary | ICD-10-CM | POA: Diagnosis not present

## 2017-02-28 DIAGNOSIS — M5134 Other intervertebral disc degeneration, thoracic region: Secondary | ICD-10-CM | POA: Diagnosis not present

## 2017-03-08 ENCOUNTER — Ambulatory Visit: Payer: 59 | Admitting: Obstetrics & Gynecology

## 2017-03-19 DIAGNOSIS — M5134 Other intervertebral disc degeneration, thoracic region: Secondary | ICD-10-CM | POA: Diagnosis not present

## 2017-03-19 DIAGNOSIS — M542 Cervicalgia: Secondary | ICD-10-CM | POA: Diagnosis not present

## 2017-03-19 DIAGNOSIS — M25511 Pain in right shoulder: Secondary | ICD-10-CM | POA: Diagnosis not present

## 2017-03-26 DIAGNOSIS — M5134 Other intervertebral disc degeneration, thoracic region: Secondary | ICD-10-CM | POA: Diagnosis not present

## 2017-03-26 DIAGNOSIS — M542 Cervicalgia: Secondary | ICD-10-CM | POA: Diagnosis not present

## 2017-03-26 DIAGNOSIS — M25511 Pain in right shoulder: Secondary | ICD-10-CM | POA: Diagnosis not present

## 2017-03-29 DIAGNOSIS — M25511 Pain in right shoulder: Secondary | ICD-10-CM | POA: Diagnosis not present

## 2017-03-29 DIAGNOSIS — M542 Cervicalgia: Secondary | ICD-10-CM | POA: Diagnosis not present

## 2017-03-29 DIAGNOSIS — M5134 Other intervertebral disc degeneration, thoracic region: Secondary | ICD-10-CM | POA: Diagnosis not present

## 2017-04-04 ENCOUNTER — Encounter: Payer: Self-pay | Admitting: Obstetrics & Gynecology

## 2017-04-04 ENCOUNTER — Other Ambulatory Visit: Payer: Self-pay | Admitting: Obstetrics & Gynecology

## 2017-04-04 ENCOUNTER — Telehealth: Payer: Self-pay | Admitting: Obstetrics & Gynecology

## 2017-04-04 DIAGNOSIS — Z8041 Family history of malignant neoplasm of ovary: Secondary | ICD-10-CM

## 2017-04-04 MED ORDER — FLUCONAZOLE 150 MG PO TABS: 150.0000 mg | ORAL_TABLET | Freq: Once | ORAL | 0 refills | Status: AC

## 2017-04-04 NOTE — Telephone Encounter (Signed)
Returned call to patient to review benefits and schedule yearly ultrasound. Left voicemail message requesting a return call.

## 2017-04-04 NOTE — Telephone Encounter (Signed)
Call placed to patient to review benefits and schedule yearly ultrasound. Left voicemail message requesting a return call.

## 2017-04-04 NOTE — Telephone Encounter (Signed)
Patient is returning a call to Suzy. °

## 2017-04-04 NOTE — Telephone Encounter (Signed)
Spoke with patient regarding benefit for recommended yearly ultrasound. Patient understood and agreeable. Patient ready to schedule. Patient scheduled 05/16/17 with Dr Sabra Heck. Patient aware of appointment date, arrival time and cancellation policy. No further questions. Ok to close   cc: Dr Sabra Heck

## 2017-04-11 DIAGNOSIS — M5134 Other intervertebral disc degeneration, thoracic region: Secondary | ICD-10-CM | POA: Diagnosis not present

## 2017-04-11 DIAGNOSIS — M25511 Pain in right shoulder: Secondary | ICD-10-CM | POA: Diagnosis not present

## 2017-04-11 DIAGNOSIS — M542 Cervicalgia: Secondary | ICD-10-CM | POA: Diagnosis not present

## 2017-04-15 DIAGNOSIS — M5134 Other intervertebral disc degeneration, thoracic region: Secondary | ICD-10-CM | POA: Diagnosis not present

## 2017-04-15 DIAGNOSIS — M25511 Pain in right shoulder: Secondary | ICD-10-CM | POA: Diagnosis not present

## 2017-04-15 DIAGNOSIS — M542 Cervicalgia: Secondary | ICD-10-CM | POA: Diagnosis not present

## 2017-04-25 DIAGNOSIS — M25511 Pain in right shoulder: Secondary | ICD-10-CM | POA: Diagnosis not present

## 2017-04-25 DIAGNOSIS — M5134 Other intervertebral disc degeneration, thoracic region: Secondary | ICD-10-CM | POA: Diagnosis not present

## 2017-04-25 DIAGNOSIS — M542 Cervicalgia: Secondary | ICD-10-CM | POA: Diagnosis not present

## 2017-04-26 DIAGNOSIS — Z803 Family history of malignant neoplasm of breast: Secondary | ICD-10-CM | POA: Diagnosis not present

## 2017-04-26 DIAGNOSIS — Z1231 Encounter for screening mammogram for malignant neoplasm of breast: Secondary | ICD-10-CM | POA: Diagnosis not present

## 2017-05-02 DIAGNOSIS — M5134 Other intervertebral disc degeneration, thoracic region: Secondary | ICD-10-CM | POA: Diagnosis not present

## 2017-05-02 DIAGNOSIS — M542 Cervicalgia: Secondary | ICD-10-CM | POA: Diagnosis not present

## 2017-05-02 DIAGNOSIS — M25511 Pain in right shoulder: Secondary | ICD-10-CM | POA: Diagnosis not present

## 2017-05-07 DIAGNOSIS — M5134 Other intervertebral disc degeneration, thoracic region: Secondary | ICD-10-CM | POA: Diagnosis not present

## 2017-05-07 DIAGNOSIS — M25511 Pain in right shoulder: Secondary | ICD-10-CM | POA: Diagnosis not present

## 2017-05-07 DIAGNOSIS — M542 Cervicalgia: Secondary | ICD-10-CM | POA: Diagnosis not present

## 2017-05-08 ENCOUNTER — Telehealth: Payer: Self-pay

## 2017-05-08 NOTE — Telephone Encounter (Signed)
-----   Message from Megan Salon, MD sent at 05/08/2017  6:38 AM EDT ----- Regarding: needs PUS Please call pt.  She needs PUS scheduled in 2023-06-28.  Her mother died of ovarian cancer.  Thanks.  Vinnie Level

## 2017-05-08 NOTE — Telephone Encounter (Signed)
Left message to call Kariem Wolfson at [phone]. 

## 2017-05-10 DIAGNOSIS — M25511 Pain in right shoulder: Secondary | ICD-10-CM | POA: Diagnosis not present

## 2017-05-10 DIAGNOSIS — M5134 Other intervertebral disc degeneration, thoracic region: Secondary | ICD-10-CM | POA: Diagnosis not present

## 2017-05-10 DIAGNOSIS — M542 Cervicalgia: Secondary | ICD-10-CM | POA: Diagnosis not present

## 2017-05-10 NOTE — Telephone Encounter (Signed)
Spoke with patient. Patient is scheduled for PUS on 05/16/2017 at 12:30 pm with 1 pm consult with Dr.Miller.  Routing to provider for final review. Patient agreeable to disposition. Will close encounter.

## 2017-05-11 DIAGNOSIS — N390 Urinary tract infection, site not specified: Secondary | ICD-10-CM | POA: Diagnosis not present

## 2017-05-12 ENCOUNTER — Encounter: Payer: Self-pay | Admitting: Obstetrics & Gynecology

## 2017-05-13 ENCOUNTER — Telehealth: Payer: Self-pay

## 2017-05-13 NOTE — Telephone Encounter (Signed)
Yes, this is fine. Thanks!

## 2017-05-13 NOTE — Telephone Encounter (Signed)
Non-Urgent Medical Question  Message [phone]  From Vivien Rota To Megan Salon, MD Sent 05/12/2017 9:04 AM  Just letting you know I had to do online visit yesterday for UTI.  I was prescribed nitrofurantoon macro 100mg  2xs day for 5 days. I don't believe it will affect the ultrasound but just wanted to make sure you're aware.  Also. 1st time I had sex in a year on Thursday.  Condom broke and I may have a tear. I've just noticed some spotting today (Sunday). I had no spotting prior to today.  If you want to see me before the ultrasound I will come in. Just let me know what time.   Responsible Party   Pool - [facility] Triage Pool No one has taken responsibility for this message.  [address]   User Action Time  Megan Salon, MD     Spoke with patient. Advised Dr.Miller would like to see her prior to her PUS on 05/16/2017 at 12:30 pm. Advised to come early at 12 pm for evaluation as Dr.Miller wants to ensure there is not a tear that will cause discomfort with PUS. Patient verbalizes understanding.  Dr.Miller, is this time okay?

## 2017-05-13 NOTE — Telephone Encounter (Signed)
Please see telephone encounter dated with today's date. 

## 2017-05-14 ENCOUNTER — Encounter: Payer: Self-pay | Admitting: Obstetrics & Gynecology

## 2017-05-14 NOTE — Telephone Encounter (Signed)
Note attached to patient's appointment.

## 2017-05-16 ENCOUNTER — Ambulatory Visit (INDEPENDENT_AMBULATORY_CARE_PROVIDER_SITE_OTHER): Payer: 59 | Admitting: Obstetrics & Gynecology

## 2017-05-16 ENCOUNTER — Ambulatory Visit (INDEPENDENT_AMBULATORY_CARE_PROVIDER_SITE_OTHER): Payer: 59

## 2017-05-16 VITALS — BP 108/70 | HR 84 | Resp 14 | Ht 65.75 in | Wt 154.0 lb

## 2017-05-16 DIAGNOSIS — Z8041 Family history of malignant neoplasm of ovary: Secondary | ICD-10-CM

## 2017-05-16 MED ORDER — CYCLOBENZAPRINE HCL 10 MG PO TABS: 10.0000 mg | ORAL_TABLET | Freq: Three times a day (TID) | ORAL | 0 refills | Status: DC | PRN

## 2017-05-16 NOTE — Progress Notes (Signed)
GYNECOLOGY  VISIT  CC:   ultraound  HPI: 41 y.o. G2P1 Divorced Caucasian female here for ultrasound.  Mother died of ovarian cancer.  Pt did have genetic testing that was negative.  Pt does not cycle as she has a [place] IUD.  Denies pelvic pain.  Denies changes to bowel or bladder.Marland Kitchen  GYNECOLOGIC HISTORY: No LMP recorded. Patient is not currently having periods (Reason: IUD).  Patient Active Problem List   Diagnosis Date Noted  . Anosmia 11/19/2016  . Mild persistent asthma 08/14/2016  . History of food allergy 08/14/2016  . Allergic rhinitis 08/14/2016  . Hearing loss 05/29/2015  . 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 Endometriosis 05/10/2014  . Excessive or frequent menstruation 05/10/2014  . B12 DEFICIENCY 03/20/2007    Past Medical History:  Diagnosis Date  . Abnormal Pap smear    history of colposcopy ECC with atypical squamous metaplasia  . Anxiety   . Asthma   . Congenital hearing loss    started age 6  . DM (diabetes mellitus) ([place])   . Endometriosis 10/10  . Hx of migraines    increased frequency with estrogen containing OCPs  . IUD (intrauterine device) in place   . Panic attacks   . Recurrent upper respiratory infection (URI)     Past Surgical History:  Procedure Laterality Date  . BREAST REDUCTION SURGERY  9/10  . COCHLEAR IMPLANT Left 2013   moval of Envoy device  . IMPLANTATION OF ENVOY ESTEEM HEARING DEVICE     for hearing loss  . TUBAL LIGATION  10/10   cautery of endometriosis    MEDS:   Current Outpatient Medications on File Prior to Visit  Medication Sig Dispense Refill  . albuterol (VENTOLIN HFA) 108 (90 Base) MCG/ACT inhaler Inhale 2 puffs into the lungs every 4 (four) hours as needed for wheezing or shortness of breath. 1 Inhaler 5  . ALPRAZolam (XANAX) 0.5 MG tablet Take 1 tablet (0.5 mg total) by mouth 2 (two) times daily as needed for anxiety. 30 tablet 0  . azelastine (ASTELIN) 0.1 % nasal spray Place 2 sprays into both nostrils 2  (two) times daily. 30 mL 5  . Cholecalciferol (VITAMIN D PO) Take 2,000 Int'l Units by mouth daily.    Marland Kitchen ELDERBERRY PO Take by mouth daily.    Marland Kitchen Fexofenadine HCl (ALLEGRA PO) Take by mouth as needed.    . fluticasone (FLONASE) 50 MCG/ACT nasal spray Place 1 spray into both nostrils daily. 16 g 5  . fluticasone (FLOVENT HFA) 110 MCG/ACT inhaler Inhale 1 puff into the lungs 2 (two) times daily.    . Fluticasone Propionate (XHANCE) 93 MCG/ACT EXHU Place 2 puffs into both nostrils 2 (two) times daily. 16 mL 5  . Multiple Vitamins-Minerals (MULTIVITAMIN GUMMIES ADULT PO) Take by mouth daily. Super food    . Olopatadine HCl (PATADAY) 0.2 % SOLN Place 1 drop into both eyes daily. 1 Bottle 5  . ondansetron (ZOFRAN) 4 MG tablet Take 1 tablet (4 mg total) by mouth every 8 (eight) hours as needed for nausea or vomiting. 30 tablet 1  . promethazine (PHENERGAN) 12.5 MG tablet Take 1 tablet (12.5 mg total) by mouth every 6 (six) hours as needed for nausea or vomiting. 60 tablet 1   No current facility-administered medications on file prior to visit.     ALLERGIES: Augmentin [amoxicillin-pot clavulanate]; Ciprofloxacin; Codeine; Shrimp [shellfish allergy]; and Sulfa antibiotics  Family History  Problem Relation Age of Onset  . Deafness Mother   .  Diabetes Mother   . Heart Problems Mother        tachycardia  . Ovarian cancer Mother 12  . Deafness Father   . Hypertension Father   . Cirrhosis Father        non alcoholic  . COPD Father   . Hyperlipidemia Father   . Down syndrome Sister   . Diabetes Paternal Grandmother   . Allergic rhinitis Neg Hx   . Angioedema Neg Hx   . Asthma Neg Hx   . Eczema Neg Hx   . Immunodeficiency Neg Hx   . Urticaria Neg Hx    SH:  Divorced, non smoker  ROS  PHYSICAL EXAMINATION:    BP 108/70 (BP Location: Right Arm, Patient Position: Sitting, Cuff Size: Large)   Pulse 84   Resp 14   Ht 5' 5.75" (1.67 m)   Wt 154 lb (69.9 kg)   BMI 25.05 kg/m     General  appearance: alert, cooperative and appears stated age  Ultrasound results. Uterus:  7.1 x 4.6 x 3.7cm Endometrium:  3.85mm, IUD in correct location Left ovary:  2.5 x 1.1 x 1.5cm Right ovary:  2.7 x 1.7 x 2.2cm.  Bilateral follicles noted. Cul de sac:  No free fluid  Assessment: Family hx of ovarian cancer, normal ultrasound today  Plan: Return for AEX and repeat PUS 1 year.   ~15 minutes spent with patient >50% of time was in face to face discussion of above.

## 2017-05-19 ENCOUNTER — Encounter: Payer: Self-pay | Admitting: Obstetrics & Gynecology

## 2017-05-27 ENCOUNTER — Other Ambulatory Visit: Payer: Self-pay | Admitting: Allergy and Immunology

## 2017-05-27 DIAGNOSIS — M7541 Impingement syndrome of right shoulder: Secondary | ICD-10-CM | POA: Diagnosis not present

## 2017-05-27 DIAGNOSIS — M19011 Primary osteoarthritis, right shoulder: Secondary | ICD-10-CM | POA: Diagnosis not present

## 2017-06-10 DIAGNOSIS — M19011 Primary osteoarthritis, right shoulder: Secondary | ICD-10-CM | POA: Diagnosis not present

## 2017-06-10 DIAGNOSIS — M7541 Impingement syndrome of right shoulder: Secondary | ICD-10-CM | POA: Diagnosis not present

## 2017-06-27 ENCOUNTER — Other Ambulatory Visit: Payer: Self-pay | Admitting: Allergy and Immunology

## 2017-07-08 DIAGNOSIS — M7541 Impingement syndrome of right shoulder: Secondary | ICD-10-CM | POA: Diagnosis not present

## 2017-07-08 DIAGNOSIS — G8918 Other acute postprocedural pain: Secondary | ICD-10-CM | POA: Diagnosis not present

## 2017-07-08 DIAGNOSIS — M19011 Primary osteoarthritis, right shoulder: Secondary | ICD-10-CM | POA: Diagnosis not present

## 2017-07-15 DIAGNOSIS — M25519 Pain in unspecified shoulder: Secondary | ICD-10-CM | POA: Diagnosis not present

## 2017-07-15 DIAGNOSIS — M19011 Primary osteoarthritis, right shoulder: Secondary | ICD-10-CM | POA: Diagnosis not present

## 2017-07-15 DIAGNOSIS — M25511 Pain in right shoulder: Secondary | ICD-10-CM | POA: Diagnosis not present

## 2017-07-18 DIAGNOSIS — M25519 Pain in unspecified shoulder: Secondary | ICD-10-CM | POA: Diagnosis not present

## 2017-07-22 DIAGNOSIS — M25511 Pain in right shoulder: Secondary | ICD-10-CM | POA: Diagnosis not present

## 2017-07-25 DIAGNOSIS — M25511 Pain in right shoulder: Secondary | ICD-10-CM | POA: Diagnosis not present

## 2017-07-29 ENCOUNTER — Other Ambulatory Visit: Payer: Self-pay | Admitting: Allergy and Immunology

## 2017-07-30 DIAGNOSIS — M25511 Pain in right shoulder: Secondary | ICD-10-CM | POA: Diagnosis not present

## 2017-08-02 DIAGNOSIS — M25511 Pain in right shoulder: Secondary | ICD-10-CM | POA: Diagnosis not present

## 2017-08-06 DIAGNOSIS — M25511 Pain in right shoulder: Secondary | ICD-10-CM | POA: Diagnosis not present

## 2017-08-08 DIAGNOSIS — M25519 Pain in unspecified shoulder: Secondary | ICD-10-CM | POA: Diagnosis not present

## 2017-08-08 DIAGNOSIS — M25511 Pain in right shoulder: Secondary | ICD-10-CM | POA: Diagnosis not present

## 2017-08-12 DIAGNOSIS — Z4789 Encounter for other orthopedic aftercare: Secondary | ICD-10-CM | POA: Diagnosis not present

## 2017-08-13 DIAGNOSIS — M25511 Pain in right shoulder: Secondary | ICD-10-CM | POA: Diagnosis not present

## 2017-08-20 DIAGNOSIS — M25511 Pain in right shoulder: Secondary | ICD-10-CM | POA: Diagnosis not present

## 2017-08-22 DIAGNOSIS — M25511 Pain in right shoulder: Secondary | ICD-10-CM | POA: Diagnosis not present

## 2017-08-26 DIAGNOSIS — M25511 Pain in right shoulder: Secondary | ICD-10-CM | POA: Diagnosis not present

## 2017-08-27 ENCOUNTER — Ambulatory Visit: Payer: 59 | Admitting: Allergy and Immunology

## 2017-08-27 ENCOUNTER — Encounter: Payer: Self-pay | Admitting: Allergy and Immunology

## 2017-08-27 VITALS — BP 112/72 | HR 93 | Temp 98.3°F | Resp 18

## 2017-08-27 DIAGNOSIS — R430 Anosmia: Secondary | ICD-10-CM | POA: Diagnosis not present

## 2017-08-27 DIAGNOSIS — H1013 Acute atopic conjunctivitis, bilateral: Secondary | ICD-10-CM

## 2017-08-27 DIAGNOSIS — H1010 Acute atopic conjunctivitis, unspecified eye: Secondary | ICD-10-CM | POA: Insufficient documentation

## 2017-08-27 DIAGNOSIS — J3089 Other allergic rhinitis: Secondary | ICD-10-CM

## 2017-08-27 DIAGNOSIS — T7800XD Anaphylactic reaction due to unspecified food, subsequent encounter: Secondary | ICD-10-CM | POA: Diagnosis not present

## 2017-08-27 DIAGNOSIS — J4530 Mild persistent asthma, uncomplicated: Secondary | ICD-10-CM

## 2017-08-27 MED ORDER — FLUTICASONE PROPIONATE 93 MCG/ACT NA EXHU: 2.0000 | INHALANT_SUSPENSION | Freq: Two times a day (BID) | NASAL | 12 refills | Status: DC

## 2017-08-27 MED ORDER — OLOPATADINE HCL 0.2 % OP SOLN: 1.0000 [drp] | Freq: Every day | OPHTHALMIC | 5 refills | Status: DC

## 2017-08-27 MED ORDER — FLUTICASONE PROPIONATE HFA 110 MCG/ACT IN AERO: 1.0000 | INHALATION_SPRAY | Freq: Two times a day (BID) | RESPIRATORY_TRACT | 5 refills | Status: DC

## 2017-08-27 MED ORDER — ALBUTEROL SULFATE HFA 108 (90 BASE) MCG/ACT IN AERS
2.0000 | INHALATION_SPRAY | RESPIRATORY_TRACT | 1 refills | Status: DC | PRN
Start: 2017-08-27 — End: 2018-03-05

## 2017-08-27 NOTE — Assessment & Plan Note (Signed)
Currently well controlled.  Continue Flovent 110 g, 1 inhalation via spacer device twice daily, and albuterol HFA, 1-2 inhalations every 6 hours if needed.  During respiratory tract infections or asthma flares, increase Flovent 110g to 3 inhalations 3 times per day until symptoms returned to baseline.  Subjective and objective measures of pulmonary function will be followed and the treatment plan will be adjusted accordingly.

## 2017-08-27 NOTE — Patient Instructions (Signed)
Mild persistent asthma Currently well controlled.  Continue Flovent 110 g, 1 inhalation via spacer device twice daily, and albuterol HFA, 1-2 inhalations every 6 hours if needed.  During respiratory tract infections or asthma flares, increase Flovent 110g to 3 inhalations 3 times per day until symptoms returned to baseline.  Subjective and objective measures of pulmonary function will be followed and the treatment plan will be adjusted accordingly.  Allergic rhinitis Stable.  Continue appropriate allergen avoidance measures, Xhance daily, and nasal saline spray as needed and prior to medicated nasal sprays.   Anosmia  Continue Xhance daily.  History of food allergy The patient's history suggests shellfish and fish allergy.  However, she has had negative food allergen skin testing and negative blood work.     Open graded oral challenge has been offered.  Until food allergy has been definitively ruled out, she is to avoid fish and shellfish.   Return in about 5 months (around 01/24/2018), or if symptoms worsen or fail to improve.

## 2017-08-27 NOTE — Assessment & Plan Note (Signed)
Stable.  Continue appropriate allergen avoidance measures, Xhance daily, and nasal saline spray as needed and prior to medicated nasal sprays.

## 2017-08-27 NOTE — Assessment & Plan Note (Signed)
   Continue Xhance daily.

## 2017-08-27 NOTE — Progress Notes (Signed)
Follow-up Note  RE: Margaret Ford MRN: [id number] DOB: 1975/07/21 Date of Office Visit: 08/27/2017  Primary care provider: [name], MD Referring provider: [name], MD  History of present illness: Margaret Ford is a 42 y.o. female with persistent asthma, perennial allergic rhinitis, and history of food allergy presenting today for follow up.  She was last seen in this clinic in May 2018.  She reports that in the interval since her previous visit her asthma has been well controlled while taking Flovent 110 g, 1 inhalation via spacer device twice daily.  She rarely requires albuterol rescue, typically only with tennis, and does not experience nocturnal awakenings due to lower respiratory symptoms.  Her nasal allergy symptoms are well controlled with Xhance once a day.  She had negative skin test and negative blood work to fish and shellfish, however has not had open graded oral challenge yet to definitively rule out these allergies.  She continues to avoid shellfish but believes that her reaction when consuming fish was due to some type of spice on the food.   Assessment and plan: Mild persistent asthma Currently well controlled.  Continue Flovent 110 g, 1 inhalation via spacer device twice daily, and albuterol HFA, 1-2 inhalations every 6 hours if needed.  During respiratory tract infections or asthma flares, increase Flovent 110g to 3 inhalations 3 times per day until symptoms returned to baseline.  Subjective and objective measures of pulmonary function will be followed and the treatment plan will be adjusted accordingly.  Allergic rhinitis Stable.  Continue appropriate allergen avoidance measures, Xhance daily, and nasal saline spray as needed and prior to medicated nasal sprays.   Anosmia  Continue Xhance daily.  History of food allergy The patient's history suggests shellfish and fish allergy.  However, she has had negative food allergen skin testing  and negative blood work.     Open graded oral challenge has been offered.  Until food allergy has been definitively ruled out, she is to avoid fish and shellfish.   Meds ordered this encounter  Medications  . albuterol (VENTOLIN HFA) 108 (90 Base) MCG/ACT inhaler    Sig: Inhale 2 puffs into the lungs every 4 (four) hours as needed for wheezing or shortness of breath.    Dispense:  1 Inhaler    Refill:  1  . fluticasone (FLOVENT HFA) 110 MCG/ACT inhaler    Sig: Inhale 1 puff into the lungs 2 (two) times daily.    Dispense:  12 g    Refill:  5    Pt needs office visit before next refill  . Olopatadine HCl (PATADAY) 0.2 % SOLN    Sig: Place 1 drop into both eyes daily.    Dispense:  1 Bottle    Refill:  5  . Fluticasone Propionate (XHANCE) 93 MCG/ACT EXHU    Sig: Place 2 puffs into both nostrils 2 (two) times daily.    Dispense:  16 mL    Refill:  12    [phone]    Diagnostics: Spirometry:  Normal with an FEV1 of 91% predicted.  Please see scanned spirometry results for details.    Physical examination: Blood pressure 112/72, pulse 93, temperature 98.3 F (36.8 C), temperature source Oral, resp. rate 18, SpO2 93 %.  General: Alert, interactive, in no acute distress. HEENT: TMs pearly gray, turbinates moderately edematous without discharge, post-pharynx mildly erythematous. Neck: Supple without lymphadenopathy. Lungs: Clear to auscultation without wheezing, rhonchi or rales. CV: Normal S1, S2 without murmurs. Skin: Warm  and dry, without lesions or rashes.  The following portions of the patient's history were reviewed and updated as appropriate: allergies, current medications, past family history, past medical history, past social history, past surgical history and problem list.  Allergies as of 08/27/2017      Reactions   Augmentin [amoxicillin-pot Clavulanate]    Ciprofloxacin    Codeine Other (See Comments)   "sick"   Shrimp [shellfish Allergy]    Sulfa  Antibiotics Other (See Comments)   Generalized burning      Medication List        Accurate as of 08/27/17  5:32 PM. Always use your most recent med list.          albuterol 108 (90 Base) MCG/ACT inhaler Commonly known as:  VENTOLIN HFA Inhale 2 puffs into the lungs every 4 (four) hours as needed for wheezing or shortness of breath.   ALLEGRA PO Take by mouth as needed.   ALPRAZolam 0.5 MG tablet Commonly known as:  XANAX Take 1 tablet (0.5 mg total) by mouth 2 (two) times daily as needed for anxiety.   azelastine 0.1 % nasal spray Commonly known as:  ASTELIN Place 2 sprays into both nostrils 2 (two) times daily.   cyclobenzaprine 10 MG tablet Commonly known as:  FLEXERIL Take 1 tablet (10 mg total) 3 (three) times daily as needed by mouth for muscle spasms.   ELDERBERRY PO Take by mouth daily.   fluticasone 110 MCG/ACT inhaler Commonly known as:  FLOVENT HFA Inhale 1 puff into the lungs 2 (two) times daily.   fluticasone 50 MCG/ACT nasal spray Commonly known as:  FLONASE Place 1 spray into both nostrils daily.   Fluticasone Propionate 93 MCG/ACT Exhu Commonly known as:  XHANCE Place 2 puffs into both nostrils 2 (two) times daily.   MULTIVITAMIN GUMMIES ADULT PO Take by mouth daily. Super food   Olopatadine HCl 0.2 % Soln Commonly known as:  PATADAY Place 1 drop into both eyes daily.   ondansetron 4 MG tablet Commonly known as:  ZOFRAN Take 1 tablet (4 mg total) by mouth every 8 (eight) hours as needed for nausea or vomiting.   promethazine 12.5 MG tablet Commonly known as:  PHENERGAN Take 1 tablet (12.5 mg total) by mouth every 6 (six) hours as needed for nausea or vomiting.   VITAMIN D PO Take 2,000 Int'l Units by mouth daily.       Allergies  Allergen Reactions  . Augmentin [Amoxicillin-Pot Clavulanate]   . Ciprofloxacin   . Codeine Other (See Comments)    "sick"  . Shrimp [Shellfish Allergy]   . Sulfa Antibiotics Other (See Comments)     Generalized burning   Review of systems: Review of systems negative except as noted in HPI / PMHx or noted below: Constitutional: Negative.  HENT: Negative.   Eyes: Negative.  Respiratory: Negative.   Cardiovascular: Negative.  Gastrointestinal: Negative.  Genitourinary: Negative.  Musculoskeletal: Negative.  Neurological: Negative.  Endo/Heme/Allergies: Negative.  Cutaneous: Negative.  Past Medical History:  Diagnosis Date  . Abnormal Pap smear    history of colposcopy ECC with atypical squamous metaplasia  . Anxiety   . Asthma   . Congenital hearing loss    started age 66  . DM (diabetes mellitus) ([place])   . Endometriosis 10/10  . Hx of migraines    increased frequency with estrogen containing OCPs  . IUD (intrauterine device) in place   . Panic attacks   . Recurrent upper respiratory infection (URI)  Family History  Problem Relation Age of Onset  . Deafness Mother   . Diabetes Mother   . Heart Problems Mother        tachycardia  . Ovarian cancer Mother 83  . Deafness Father   . Hypertension Father   . Cirrhosis Father        non alcoholic  . COPD Father   . Hyperlipidemia Father   . Down syndrome Sister   . Diabetes Paternal Grandmother   . Allergic rhinitis Neg Hx   . Angioedema Neg Hx   . Asthma Neg Hx   . Eczema Neg Hx   . Immunodeficiency Neg Hx   . Urticaria Neg Hx     Social History   Socioeconomic History  . Marital status: Divorced    Spouse name: Not on file  . Number of children: 1  . Years of education: Not on file  . Highest education level: Not on file  Social Needs  . Financial resource strain: Not on file  . Food insecurity - worry: Not on file  . Food insecurity - inability: Not on file  . Transportation needs - medical: Not on file  . Transportation needs - non-medical: Not on file  Occupational History  . Occupation: Emergency planning/management officer  Tobacco Use  . Smoking status: Never Smoker  . Smokeless tobacco: Never Used    Substance and Sexual Activity  . Alcohol use: Yes    Alcohol/week: 0.0 oz    Comment: socially  . Drug use: No  . Sexual activity: No    Partners: Male    Birth control/protection: Surgical    Comment: BTL  Other Topics Concern  . Not on file  Social History Narrative   Divorced 1 child   [place]    I appreciate the opportunity to take part in [place] care. Please do not hesitate to contact me with questions.  Sincerely,   R. Edgar Frisk, MD

## 2017-08-27 NOTE — Assessment & Plan Note (Signed)
The patient's history suggests shellfish and fish allergy.  However, she has had negative food allergen skin testing and negative blood work.     Open graded oral challenge has been offered.  Until food allergy has been definitively ruled out, she is to avoid fish and shellfish.

## 2017-08-29 DIAGNOSIS — M25511 Pain in right shoulder: Secondary | ICD-10-CM | POA: Diagnosis not present

## 2017-08-30 DIAGNOSIS — Z45321 Encounter for adjustment and management of cochlear device: Secondary | ICD-10-CM | POA: Diagnosis not present

## 2017-08-30 DIAGNOSIS — Z9621 Cochlear implant status: Secondary | ICD-10-CM | POA: Diagnosis not present

## 2017-08-30 DIAGNOSIS — H903 Sensorineural hearing loss, bilateral: Secondary | ICD-10-CM | POA: Diagnosis not present

## 2017-09-03 DIAGNOSIS — M25511 Pain in right shoulder: Secondary | ICD-10-CM | POA: Diagnosis not present

## 2017-09-05 DIAGNOSIS — M25511 Pain in right shoulder: Secondary | ICD-10-CM | POA: Diagnosis not present

## 2017-09-09 DIAGNOSIS — H903 Sensorineural hearing loss, bilateral: Secondary | ICD-10-CM | POA: Diagnosis not present

## 2017-09-13 DIAGNOSIS — M25511 Pain in right shoulder: Secondary | ICD-10-CM | POA: Diagnosis not present

## 2017-09-14 ENCOUNTER — Encounter: Payer: Self-pay | Admitting: Obstetrics & Gynecology

## 2017-09-16 ENCOUNTER — Telehealth: Payer: Self-pay | Admitting: *Deleted

## 2017-09-16 NOTE — Telephone Encounter (Signed)
Spoke with patient. Mirena IUD placed 06/2015, hx of no menses with IUD. Shoulder surgery 07/08/17. Reports menses 12/26, 1/7, 2/21, feels like menses is getting ready to start again. Concerned with increased acne. Reports 2 migraines since December.   Denies vaginal d/c, odor, pain, N/V, fever/chills.   Recommended OV for further evaluation. OV scheduled for 3/14 at 2:30pm with Dr. Sabra Heck. Advised Dr. Sabra Heck will review, our office will return call with any additional recommendations.  Last OV 05/16/17.  Routing to provider for final review. Patient is agreeable to disposition. Will close encounter.

## 2017-09-16 NOTE — Telephone Encounter (Signed)
-----   Message from [place], Generic sent at 09/14/2017 12:07 PM EST -----    Hi Dr. Sabra Heck,    I just wanted to update you. I had surgery on shoulder on 12/31.  Since then, I've had 2 periods accompanied by migraine.  Along with horrible acne in my chin area. It just won't go away.  42 year old with acne what? Anyway, I'm just wondering if the IUD is running out of hormones or if this is some new hormonal hell I'm experiencing.  Side effect of surgery???  Do you think I need to be seen? New IUD or antibiotic for this chin acne? Or just take it all and leave the ovaries. The period lasted 5 days. Light but there. Migraine came along day before I started per usual.  Frustrated with my forties.  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  Not really frozen shoulder. Just spurs, arthritis and bursitis. Getting old sucks. I have PT twice a week.

## 2017-09-17 DIAGNOSIS — M25511 Pain in right shoulder: Secondary | ICD-10-CM | POA: Diagnosis not present

## 2017-09-17 DIAGNOSIS — M25519 Pain in unspecified shoulder: Secondary | ICD-10-CM | POA: Diagnosis not present

## 2017-09-19 ENCOUNTER — Encounter: Payer: Self-pay | Admitting: Obstetrics & Gynecology

## 2017-09-19 ENCOUNTER — Ambulatory Visit: Payer: 59 | Admitting: Obstetrics & Gynecology

## 2017-09-19 VITALS — BP 108/70 | HR 100 | Resp 16 | Wt 160.0 lb

## 2017-09-19 DIAGNOSIS — Z30433 Encounter for removal and reinsertion of intrauterine contraceptive device: Secondary | ICD-10-CM

## 2017-09-19 DIAGNOSIS — N926 Irregular menstruation, unspecified: Secondary | ICD-10-CM

## 2017-09-19 DIAGNOSIS — Z113 Encounter for screening for infections with a predominantly sexual mode of transmission: Secondary | ICD-10-CM

## 2017-09-19 DIAGNOSIS — N9089 Other specified noninflammatory disorders of vulva and perineum: Secondary | ICD-10-CM | POA: Diagnosis not present

## 2017-09-19 LAB — POCT URINE PREGNANCY: Preg Test, Ur: NEGATIVE

## 2017-09-19 MED ORDER — DOXYCYCLINE HYCLATE 100 MG PO CAPS: 100.0000 mg | ORAL_CAPSULE | Freq: Two times a day (BID) | ORAL | 0 refills | Status: DC

## 2017-09-19 NOTE — Progress Notes (Signed)
GYNECOLOGY  VISIT  CC:   Irregular bleeding  HPI: 42 y.o. G2P1 Divorced Caucasian female here for irregular bleeding that has been present since December.  She's had bleeding about every two weeks.  Flow lasts 5-7 days and it like a regular cycle.  Pt has this about 4 years after her first IUD was placed.  Having minimal cramping.  Very frustrated with the bleeding.  Has a cochlear implant and has increased hearing issues while she is bleeding.  Mother died due to ovarian cancer.  Pt having yearly ultrasounds and continues to contemplate hysterectomy.  Pt is desirous of oophorectomy at some point.  She is considering this again at this time due to bleeding.  She has just gone back to work due to a shoulder surgery so wants to discuss timing, hospital stay, recovery.  Not really sure she has the time to take much time from a work standpoint.  D/w pt replacing IUD to see if this will help with irregular bleeding before making any decisions about surgery.  Pt comfortable with this plan.  Pt also has a vulvar lesion that is tender and has been present for several days she'd like me to assess.  It is on the left labia majora.  Denies fever.  No drainage or bleeding.  GYNECOLOGIC HISTORY: Patient's last menstrual period was 09/18/2017. Contraception: IUD. Mirena placed 06/23/14  Menopausal hormone therapy: none  Patient Active Problem List   Diagnosis Date Noted  . Allergic conjunctivitis 08/27/2017  . Anosmia 11/19/2016  . Mild persistent asthma 08/14/2016  . History of food allergy 08/14/2016  . Allergic rhinitis 08/14/2016  . Hearing loss 05/29/2015  . 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 Endometriosis 05/10/2014  . Excessive or frequent menstruation 05/10/2014  . B12 DEFICIENCY 03/20/2007    Past Medical History:  Diagnosis Date  . Abnormal Pap smear    history of colposcopy ECC with atypical squamous metaplasia  . Anxiety   . Asthma   . Congenital hearing loss    started age  57  . DM (diabetes mellitus) ([place])   . Endometriosis 10/10  . Hx of migraines    increased frequency with estrogen containing OCPs  . IUD (intrauterine device) in place   . Panic attacks   . Recurrent upper respiratory infection (URI)     Past Surgical History:  Procedure Laterality Date  . BREAST REDUCTION SURGERY  9/10  . COCHLEAR IMPLANT Left 2013   moval of Envoy device  . IMPLANTATION OF ENVOY ESTEEM HEARING DEVICE     for hearing loss  . TUBAL LIGATION  10/10   cautery of endometriosis    MEDS:   Current Outpatient Medications on File Prior to Visit  Medication Sig Dispense Refill  . albuterol (VENTOLIN HFA) 108 (90 Base) MCG/ACT inhaler Inhale 2 puffs into the lungs every 4 (four) hours as needed for wheezing or shortness of breath. 1 Inhaler 1  . ALPRAZolam (XANAX) 0.5 MG tablet Take 1 tablet (0.5 mg total) by mouth 2 (two) times daily as needed for anxiety. 30 tablet 0  . azelastine (ASTELIN) 0.1 % nasal spray Place 2 sprays into both nostrils 2 (two) times daily. (Patient taking differently: Place 2 sprays into both nostrils daily as needed. ) 30 mL 5  . Cholecalciferol (VITAMIN D PO) Take 2,000 Int'l Units by mouth daily.    . cyclobenzaprine (FLEXERIL) 10 MG tablet Take 1 tablet (10 mg total) 3 (three) times daily as needed by mouth for muscle  spasms. 30 tablet 0  . ELDERBERRY PO Take by mouth daily.    Marland Kitchen Fexofenadine HCl (ALLEGRA PO) Take by mouth as needed.    . fluticasone (FLOVENT HFA) 110 MCG/ACT inhaler Inhale 1 puff into the lungs 2 (two) times daily. 12 g 5  . Fluticasone Propionate (XHANCE) 93 MCG/ACT EXHU Place 2 puffs into both nostrils 2 (two) times daily. 16 mL 12  . Multiple Vitamins-Minerals (MULTIVITAMIN GUMMIES ADULT PO) Take by mouth daily. Super food    . Probiotic Product (PROBIOTIC-10) CHEW Chew by mouth daily.    . Turmeric 500 MG CAPS Take by mouth daily.     No current facility-administered medications on file prior to visit.     ALLERGIES:  Augmentin [amoxicillin-pot clavulanate]; Ciprofloxacin; Codeine; Shrimp [shellfish allergy]; and Sulfa antibiotics  Family History  Problem Relation Age of Onset  . Deafness Mother   . Diabetes Mother   . Heart Problems Mother        tachycardia  . Ovarian cancer Mother 37  . Deafness Father   . Hypertension Father   . Cirrhosis Father        non alcoholic  . COPD Father   . Hyperlipidemia Father   . Down syndrome Sister   . Diabetes Paternal Grandmother   . Allergic rhinitis Neg Hx   . Angioedema Neg Hx   . Asthma Neg Hx   . Eczema Neg Hx   . Immunodeficiency Neg Hx   . Urticaria Neg Hx     SH:  Divorced, non smoker  Review of Systems  All other systems reviewed and are negative.   PHYSICAL EXAMINATION:    BP 108/70 (BP Location: Right Arm, Patient Position: Sitting, Cuff Size: Normal)   Pulse 100   Resp 16   Wt 160 lb (72.6 kg)   LMP 09/18/2017   BMI 26.02 kg/m     General appearance: alert, cooperative and appears stated age Abdomen: soft, non-tender; bowel sounds normal; no masses,  no organomegaly  Pelvic: External genitalia:  2cm left labial lesion, erythematous and very tender to palpation, no fluctuance              Urethra:  normal appearing urethra with no masses, tenderness or lesions              Bartholins and Skenes: normal                 Vagina: normal appearing vagina with normal color and discharge, no lesions              Cervix: no lesions              Bimanual Exam:  Uterus:  normal size, contour, position, consistency, mobility, non-tender              Adnexa: no mass, fullness, tenderness  Procedure:  Speculum placed.  Cervix cleansed with Betadine x 3.  Anterior lip of cervix grasped with single toothed tenaculum.  IUD string grasped and IUD removed with one pull.  Discarded.  Cervix cleansed again.  Then terus sounded to 8cm.  Mirena IUD and introducer passed into endometrial cavity and then slightly withdrawn releasing IUD into endometrial  cavity.  Introducer removed.  IUD strings cut to 2cm.  Pt tolerated procedure well.  Lot:  [id number].  Exp: 02/2020.  Chaperone was present for exam.  Assessment: DUB IUD for contraception Furuncle  Plan: GC/Chl obtained today Doxycycline 100mg  bid x 7 days Recheck 6 weeks

## 2017-09-20 DIAGNOSIS — M25511 Pain in right shoulder: Secondary | ICD-10-CM | POA: Diagnosis not present

## 2017-09-20 LAB — GC/CHLAMYDIA PROBE AMP
Chlamydia trachomatis, NAA: NEGATIVE
Neisseria gonorrhoeae by PCR: NEGATIVE

## 2017-09-24 DIAGNOSIS — M25511 Pain in right shoulder: Secondary | ICD-10-CM | POA: Diagnosis not present

## 2017-09-26 DIAGNOSIS — M25511 Pain in right shoulder: Secondary | ICD-10-CM | POA: Diagnosis not present

## 2017-10-01 DIAGNOSIS — M25511 Pain in right shoulder: Secondary | ICD-10-CM | POA: Diagnosis not present

## 2017-10-03 DIAGNOSIS — M25511 Pain in right shoulder: Secondary | ICD-10-CM | POA: Diagnosis not present

## 2017-10-11 DIAGNOSIS — M25511 Pain in right shoulder: Secondary | ICD-10-CM | POA: Diagnosis not present

## 2017-10-13 ENCOUNTER — Encounter: Payer: Self-pay | Admitting: Allergy and Immunology

## 2017-10-13 ENCOUNTER — Encounter: Payer: Self-pay | Admitting: Obstetrics & Gynecology

## 2017-10-15 ENCOUNTER — Encounter: Payer: Self-pay | Admitting: Allergy and Immunology

## 2017-10-15 DIAGNOSIS — M25511 Pain in right shoulder: Secondary | ICD-10-CM | POA: Diagnosis not present

## 2017-10-15 NOTE — Addendum Note (Signed)
Addended by: Golda Acre C on: 10/15/2017 07:04 PM   Modules accepted: Orders

## 2017-10-16 ENCOUNTER — Encounter: Payer: Self-pay | Admitting: *Deleted

## 2017-10-16 DIAGNOSIS — J3089 Other allergic rhinitis: Secondary | ICD-10-CM | POA: Diagnosis not present

## 2017-10-16 MED ORDER — EPINEPHRINE 0.3 MG/0.3ML IJ SOAJ: 0.3000 mg | Freq: Once | INTRAMUSCULAR | 1 refills | Status: AC

## 2017-10-16 NOTE — Progress Notes (Signed)
4 VIAL SET MADE. EXP: 10-17-18. HV

## 2017-10-17 DIAGNOSIS — M25511 Pain in right shoulder: Secondary | ICD-10-CM | POA: Diagnosis not present

## 2017-10-17 DIAGNOSIS — J301 Allergic rhinitis due to pollen: Secondary | ICD-10-CM | POA: Diagnosis not present

## 2017-10-22 DIAGNOSIS — M25511 Pain in right shoulder: Secondary | ICD-10-CM | POA: Diagnosis not present

## 2017-10-24 DIAGNOSIS — M25511 Pain in right shoulder: Secondary | ICD-10-CM | POA: Diagnosis not present

## 2017-10-28 ENCOUNTER — Ambulatory Visit (INDEPENDENT_AMBULATORY_CARE_PROVIDER_SITE_OTHER): Payer: 59 | Admitting: *Deleted

## 2017-10-28 DIAGNOSIS — J309 Allergic rhinitis, unspecified: Secondary | ICD-10-CM

## 2017-10-28 NOTE — Progress Notes (Signed)
Immunotherapy   Patient Details  Name: Margaret Ford MRN: [id number] Date of Birth: [birth year]  10/28/2017  Margaret Ford started injections for  grass-weed-tree/mold-dm Following schedule: A  Frequency:2 times per week Epi-Pen:Epi-Pen Available  Consent signed and patient instructions given.   Orlene Erm 10/28/2017, 11:52 AM

## 2017-10-31 ENCOUNTER — Ambulatory Visit (INDEPENDENT_AMBULATORY_CARE_PROVIDER_SITE_OTHER): Payer: 59 | Admitting: *Deleted

## 2017-10-31 DIAGNOSIS — J309 Allergic rhinitis, unspecified: Secondary | ICD-10-CM

## 2017-11-01 ENCOUNTER — Encounter: Payer: Self-pay | Admitting: Allergy and Immunology

## 2017-11-04 ENCOUNTER — Ambulatory Visit (INDEPENDENT_AMBULATORY_CARE_PROVIDER_SITE_OTHER): Payer: 59 | Admitting: *Deleted

## 2017-11-04 DIAGNOSIS — J309 Allergic rhinitis, unspecified: Secondary | ICD-10-CM | POA: Diagnosis not present

## 2017-11-04 DIAGNOSIS — N390 Urinary tract infection, site not specified: Secondary | ICD-10-CM | POA: Diagnosis not present

## 2017-11-04 DIAGNOSIS — Z0000 Encounter for general adult medical examination without abnormal findings: Secondary | ICD-10-CM | POA: Diagnosis not present

## 2017-11-05 DIAGNOSIS — M25519 Pain in unspecified shoulder: Secondary | ICD-10-CM | POA: Diagnosis not present

## 2017-11-05 DIAGNOSIS — M25511 Pain in right shoulder: Secondary | ICD-10-CM | POA: Diagnosis not present

## 2017-11-06 DIAGNOSIS — Z9889 Other specified postprocedural states: Secondary | ICD-10-CM | POA: Diagnosis not present

## 2017-11-07 ENCOUNTER — Ambulatory Visit (INDEPENDENT_AMBULATORY_CARE_PROVIDER_SITE_OTHER): Payer: 59 | Admitting: *Deleted

## 2017-11-07 ENCOUNTER — Ambulatory Visit: Payer: 59 | Admitting: Obstetrics & Gynecology

## 2017-11-07 DIAGNOSIS — J309 Allergic rhinitis, unspecified: Secondary | ICD-10-CM | POA: Diagnosis not present

## 2017-11-11 ENCOUNTER — Ambulatory Visit (INDEPENDENT_AMBULATORY_CARE_PROVIDER_SITE_OTHER): Payer: 59 | Admitting: *Deleted

## 2017-11-11 DIAGNOSIS — J309 Allergic rhinitis, unspecified: Secondary | ICD-10-CM

## 2017-11-11 DIAGNOSIS — G43909 Migraine, unspecified, not intractable, without status migrainosus: Secondary | ICD-10-CM | POA: Diagnosis not present

## 2017-11-11 DIAGNOSIS — Z0000 Encounter for general adult medical examination without abnormal findings: Secondary | ICD-10-CM | POA: Diagnosis not present

## 2017-11-11 DIAGNOSIS — J45990 Exercise induced bronchospasm: Secondary | ICD-10-CM | POA: Diagnosis not present

## 2017-11-12 DIAGNOSIS — M25511 Pain in right shoulder: Secondary | ICD-10-CM | POA: Diagnosis not present

## 2017-11-14 ENCOUNTER — Ambulatory Visit (INDEPENDENT_AMBULATORY_CARE_PROVIDER_SITE_OTHER): Payer: 59 | Admitting: *Deleted

## 2017-11-14 DIAGNOSIS — J309 Allergic rhinitis, unspecified: Secondary | ICD-10-CM | POA: Diagnosis not present

## 2017-11-14 DIAGNOSIS — M25519 Pain in unspecified shoulder: Secondary | ICD-10-CM | POA: Diagnosis not present

## 2017-11-25 ENCOUNTER — Ambulatory Visit (INDEPENDENT_AMBULATORY_CARE_PROVIDER_SITE_OTHER): Payer: 59 | Admitting: *Deleted

## 2017-11-25 DIAGNOSIS — J309 Allergic rhinitis, unspecified: Secondary | ICD-10-CM | POA: Diagnosis not present

## 2017-11-26 DIAGNOSIS — M25511 Pain in right shoulder: Secondary | ICD-10-CM | POA: Diagnosis not present

## 2017-11-28 DIAGNOSIS — M25511 Pain in right shoulder: Secondary | ICD-10-CM | POA: Diagnosis not present

## 2017-11-29 ENCOUNTER — Ambulatory Visit (INDEPENDENT_AMBULATORY_CARE_PROVIDER_SITE_OTHER): Payer: 59

## 2017-11-29 DIAGNOSIS — J309 Allergic rhinitis, unspecified: Secondary | ICD-10-CM | POA: Diagnosis not present

## 2017-12-03 ENCOUNTER — Ambulatory Visit (INDEPENDENT_AMBULATORY_CARE_PROVIDER_SITE_OTHER): Payer: 59 | Admitting: *Deleted

## 2017-12-03 DIAGNOSIS — M25511 Pain in right shoulder: Secondary | ICD-10-CM | POA: Diagnosis not present

## 2017-12-03 DIAGNOSIS — J309 Allergic rhinitis, unspecified: Secondary | ICD-10-CM | POA: Diagnosis not present

## 2017-12-04 ENCOUNTER — Other Ambulatory Visit: Payer: Self-pay | Admitting: Allergy and Immunology

## 2017-12-05 DIAGNOSIS — M25511 Pain in right shoulder: Secondary | ICD-10-CM | POA: Diagnosis not present

## 2017-12-06 ENCOUNTER — Ambulatory Visit (INDEPENDENT_AMBULATORY_CARE_PROVIDER_SITE_OTHER): Payer: 59

## 2017-12-06 ENCOUNTER — Ambulatory Visit: Payer: 59 | Admitting: Obstetrics & Gynecology

## 2017-12-06 ENCOUNTER — Encounter: Payer: Self-pay | Admitting: Obstetrics & Gynecology

## 2017-12-06 ENCOUNTER — Other Ambulatory Visit: Payer: Self-pay

## 2017-12-06 ENCOUNTER — Other Ambulatory Visit (HOSPITAL_COMMUNITY)
Admission: RE | Admit: 2017-12-06 | Discharge: 2017-12-06 | Disposition: A | Discharge status: Home / Self Care | Payer: 59 | Source: Ambulatory Visit | Attending: Obstetrics & Gynecology | Admitting: Obstetrics & Gynecology

## 2017-12-06 VITALS — BP 110/62 | HR 76 | Resp 16 | Ht 65.5 in | Wt 154.4 lb

## 2017-12-06 DIAGNOSIS — Z202 Contact with and (suspected) exposure to infections with a predominantly sexual mode of transmission: Secondary | ICD-10-CM

## 2017-12-06 DIAGNOSIS — J309 Allergic rhinitis, unspecified: Secondary | ICD-10-CM

## 2017-12-06 DIAGNOSIS — Z01419 Encounter for gynecological examination (general) (routine) without abnormal findings: Secondary | ICD-10-CM | POA: Diagnosis not present

## 2017-12-06 DIAGNOSIS — Z975 Presence of (intrauterine) contraceptive device: Secondary | ICD-10-CM

## 2017-12-06 DIAGNOSIS — Z124 Encounter for screening for malignant neoplasm of cervix: Secondary | ICD-10-CM

## 2017-12-06 DIAGNOSIS — N921 Excessive and frequent menstruation with irregular cycle: Secondary | ICD-10-CM

## 2017-12-06 DIAGNOSIS — Z0000 Encounter for general adult medical examination without abnormal findings: Secondary | ICD-10-CM | POA: Diagnosis not present

## 2017-12-06 LAB — POCT URINALYSIS DIPSTICK
Bilirubin, UA: NEGATIVE
GLUCOSE UA: NEGATIVE
Ketones, UA: NEGATIVE
LEUKOCYTES UA: NEGATIVE
NITRITE UA: NEGATIVE
PROTEIN UA: NEGATIVE
RBC UA: NEGATIVE
Urobilinogen, UA: 0.2 E.U./dL
pH, UA: 6 (ref 5.0–8.0)

## 2017-12-06 LAB — POCT URINE PREGNANCY: PREG TEST UR: NEGATIVE

## 2017-12-06 NOTE — Progress Notes (Signed)
42 y.o. G2P1 DivorcedCaucasianF here for annual exam.  Had cycle that started on Tuesday.  Bled for two days and having some spotting.  New Mirena IUD placed in March.  Has new partner.  Would like STD testing today.   PCP:  Dr. Stephania Fragmin.  Had blood work done earlier this year.  Blood work was all normal.    Patient's last menstrual period was 12/03/2017.          Sexually active: Yes.    The current method of family planning is IUD. Mirena Placed 09/19/17  Exercising: Yes.    tennis, aerobics  Smoker:  no  Health Maintenance: Pap:  11/30/16 ASCUS. HR HPV:neg   10/15/14 Neg  History of abnormal Pap:  Yes, Atypical Squamous Metaplasia MMG:  04/26/17 BIRADS2:Benign  Colonoscopy:  n/a BMD:   n/a TDaP:  2014 Screening Labs: PCP  UA: Normal    reports that she has never smoked. She has never used smokeless tobacco. She reports that she drinks alcohol. She reports that she does not use drugs.  Past Medical History:  Diagnosis Date  . Abnormal Pap smear    history of colposcopy ECC with atypical squamous metaplasia  . Anxiety   . Asthma   . Congenital hearing loss    started age 38  . DM (diabetes mellitus) ([place])   . Endometriosis 10/10  . Hx of migraines    increased frequency with estrogen containing OCPs  . IUD (intrauterine device) in place   . Panic attacks   . Recurrent upper respiratory infection (URI)     Past Surgical History:  Procedure Laterality Date  . BREAST REDUCTION SURGERY  9/10  . COCHLEAR IMPLANT Left 2013   moval of Envoy device  . IMPLANTATION OF ENVOY ESTEEM HEARING DEVICE     for hearing loss  . TUBAL LIGATION  10/10   cautery of endometriosis    Current Outpatient Medications  Medication Sig Dispense Refill  . albuterol (VENTOLIN HFA) 108 (90 Base) MCG/ACT inhaler Inhale 2 puffs into the lungs every 4 (four) hours as needed for wheezing or shortness of breath. 1 Inhaler 1  . ALPRAZolam (XANAX) 0.5 MG tablet Take 1 tablet (0.5 mg total) by mouth 2  (two) times daily as needed for anxiety. 30 tablet 0  . azelastine (ASTELIN) 0.1 % nasal spray SPRAY 2 SPRAYS INTO EACH NOSTRIL TWICE DAILY 30 mL 2  . Cholecalciferol (VITAMIN D PO) Take 2,000 Int'l Units by mouth daily.    . cyclobenzaprine (FLEXERIL) 10 MG tablet Take 1 tablet (10 mg total) 3 (three) times daily as needed by mouth for muscle spasms. 30 tablet 0  . ELDERBERRY PO Take by mouth daily.    Marland Kitchen Fexofenadine HCl (ALLEGRA PO) Take by mouth as needed.    . fluticasone (FLOVENT HFA) 110 MCG/ACT inhaler Inhale 1 puff into the lungs 2 (two) times daily. 12 g 5  . Fluticasone Propionate (XHANCE) 93 MCG/ACT EXHU Place 2 puffs into both nostrils 2 (two) times daily. 16 mL 12  . Multiple Vitamins-Minerals (MULTIVITAMIN GUMMIES ADULT PO) Take by mouth daily. Super food    . Olopatadine HCl 0.2 % SOLN daily as needed.    . Probiotic Product (PROBIOTIC-10) CHEW Chew by mouth daily.    . Turmeric 500 MG CAPS Take by mouth daily.     No current facility-administered medications for this visit.     Family History  Problem Relation Age of Onset  . Deafness Mother   . Diabetes  Mother   . Heart Problems Mother        tachycardia  . Ovarian cancer Mother 71  . Deafness Father   . Hypertension Father   . Cirrhosis Father        non alcoholic  . COPD Father   . Hyperlipidemia Father   . Down syndrome Sister   . Diabetes Paternal Grandmother   . Allergic rhinitis Neg Hx   . Angioedema Neg Hx   . Asthma Neg Hx   . Eczema Neg Hx   . Immunodeficiency Neg Hx   . Urticaria Neg Hx     Review of Systems  Gastrointestinal: Positive for nausea and vomiting.  Genitourinary:       Unscheduled bleeding  Painful periods  menstrual cycle changes  All other systems reviewed and are negative.   Exam:   BP 110/62 (BP Location: Right Arm, Patient Position: Sitting, Cuff Size: Normal)   Pulse 76   Resp 16   Ht 5' 5.5" (1.664 m)   Wt 154 lb 6.4 oz (70 kg)   LMP 12/03/2017   BMI 25.30 kg/m     Height: 5' 5.5" (166.4 cm)  Ht Readings from Last 3 Encounters:  12/06/17 5' 5.5" (1.664 m)  05/16/17 5' 5.75" (1.67 m)  11/30/16 5' 5.75" (1.67 m)    General appearance: alert, cooperative and appears stated age Head: Normocephalic, without obvious abnormality, atraumatic Neck: no adenopathy, supple, symmetrical, trachea midline and thyroid normal to inspection and palpation Lungs: clear to auscultation bilaterally Breasts: normal appearance, no masses or tenderness Heart: regular rate and rhythm Abdomen: soft, non-tender; bowel sounds normal; no masses,  no organomegaly Extremities: extremities normal, atraumatic, no cyanosis or edema Skin: Skin color, texture, turgor normal. No rashes or lesions Lymph nodes: Cervical, supraclavicular, and axillary nodes normal. No abnormal inguinal nodes palpated Neurologic: Grossly normal   Pelvic: External genitalia:  no lesions              Urethra:  normal appearing urethra with no masses, tenderness or lesions              Bartholins and Skenes: normal                 Vagina: normal appearing vagina with normal color and discharge, no lesions              Cervix: no lesions              Pap taken: Yes.   Bimanual Exam:  Uterus:  normal size, contour, position, consistency, mobility, non-tender              Adnexa: normal adnexa and no mass, fullness, tenderness               Rectovaginal: Confirms               Anus:  normal sphincter tone, no lesions  Chaperone was present for exam.  A:  Well Woman with normal exam Mirena IUD replaced 3/19 Family hx of ovarian cancer in mother (deceased) Family hx of pulmonary fibrosis, non-alcoholic cirrhosis (father, deceased) H/o endometriosis diagnosed with laparoscopy 10/10 with BTL  P:   Mammogram guidelines reviewed pap smear obtained GC/Chl, trich, HIV, RPR, and Hep B obtained today Repeat PUS and ca-125 in November return annually or prn

## 2017-12-07 LAB — HEP, RPR, HIV PANEL
HIV SCREEN 4TH GENERATION: NONREACTIVE
Hepatitis B Surface Ag: NEGATIVE
RPR Ser Ql: NONREACTIVE

## 2017-12-09 ENCOUNTER — Ambulatory Visit (INDEPENDENT_AMBULATORY_CARE_PROVIDER_SITE_OTHER): Payer: 59

## 2017-12-09 DIAGNOSIS — J309 Allergic rhinitis, unspecified: Secondary | ICD-10-CM | POA: Diagnosis not present

## 2017-12-10 DIAGNOSIS — M25511 Pain in right shoulder: Secondary | ICD-10-CM | POA: Diagnosis not present

## 2017-12-10 LAB — CYTOLOGY - PAP
CHLAMYDIA, DNA PROBE: NEGATIVE
NEISSERIA GONORRHEA: NEGATIVE

## 2017-12-11 ENCOUNTER — Telehealth: Payer: Self-pay | Admitting: *Deleted

## 2017-12-11 ENCOUNTER — Other Ambulatory Visit: Payer: Self-pay | Admitting: *Deleted

## 2017-12-11 DIAGNOSIS — R87612 Low grade squamous intraepithelial lesion on cytologic smear of cervix (LGSIL): Secondary | ICD-10-CM

## 2017-12-11 NOTE — Telephone Encounter (Addendum)
Notes recorded by Margaret Logan, RN on 12/11/2017 at 10:22 AM EDT Spoke with patient, advised of all results as seen below per Dr. Sabra Heck. Advised of negative gonorrhea and chlamydia. Contraceptive, IUD. LMP 12/11/17, menses is light with IUD, describes as spotting. Brief explanation of colpo provided, scheduled for 12/13/17 at 3:30pm with Dr. Sabra Heck. Advised to take Motrin 800 mg with food and water one hour before procedure. Patient aware will be called with benefits prior to procedure. Patient verbalizes understanding and is agreeable.   Order placed for colposcopy. See telephone encounter dated 12/11/17.    ----- Message from Margaret Salon, MD sent at 12/10/2017 11:58 PM EDT ----- Please let pt know pap showed LGSIL findings.  Needs colposcopy scheduled.  Thanks.  CC:  Lowell Bouton   Notes recorded by Margaret Salon, MD on 12/08/2017 at 10:29 PM EDT Notified pt of normal HIV, Hep B and RPR testing via Mychart.

## 2017-12-11 NOTE — Telephone Encounter (Addendum)
Routing to provider for final review. Patient is agreeable to disposition. Will close encounter.   Cc: Lerry Liner, Magdalene Patricia

## 2017-12-12 ENCOUNTER — Ambulatory Visit (INDEPENDENT_AMBULATORY_CARE_PROVIDER_SITE_OTHER): Payer: 59

## 2017-12-12 DIAGNOSIS — M25511 Pain in right shoulder: Secondary | ICD-10-CM | POA: Diagnosis not present

## 2017-12-12 DIAGNOSIS — J309 Allergic rhinitis, unspecified: Secondary | ICD-10-CM

## 2017-12-13 ENCOUNTER — Encounter: Payer: Self-pay | Admitting: Obstetrics & Gynecology

## 2017-12-13 ENCOUNTER — Ambulatory Visit (INDEPENDENT_AMBULATORY_CARE_PROVIDER_SITE_OTHER): Payer: 59 | Admitting: Obstetrics & Gynecology

## 2017-12-13 DIAGNOSIS — N870 Mild cervical dysplasia: Secondary | ICD-10-CM | POA: Diagnosis not present

## 2017-12-13 DIAGNOSIS — R87612 Low grade squamous intraepithelial lesion on cytologic smear of cervix (LGSIL): Secondary | ICD-10-CM | POA: Diagnosis not present

## 2017-12-13 NOTE — Progress Notes (Signed)
42 y.o. G2P1 Divorced Caucasian female here for colposcopy with possible biopsies and/or ECC due to LGSIL Pap obtained 12/06/17.  She's had a new sexual partner in the last two years.  Patient's last menstrual period was 12/03/2017.          Sexually active: Yes.    The current method of family planning is IUD.     Patient has been counseled about results and procedure.  Risks and benefits have bene reviewed including immediate and/or delayed bleeding, infection, cervical scaring from procedure, possibility of needing additional follow up as well as treatment.  rare risks of missing a lesion discussed as well.  All questions answered.  Pt ready to proceed.  BP 110/70 (BP Location: Right Arm, Patient Position: Sitting, Cuff Size: Normal)   Pulse 84   Ht 5' 5.5" (1.664 m)   Wt 154 lb (69.9 kg)   LMP 12/03/2017   BMI 25.24 kg/m   Physical Exam  Constitutional: She is oriented to person, place, and time. She appears well-developed and well-nourished.  Genitourinary: No erythema, tenderness or bleeding in the vagina. No foreign body in the vagina. No signs of injury around the vagina. No vaginal discharge found.    Lymphadenopathy:       Right: No inguinal adenopathy present.       Left: No inguinal adenopathy present.  Neurological: She is alert and oriented to person, place, and time.  Skin: Skin is warm and dry.  Psychiatric: She has a normal mood and affect.    Speculum placed.  3% acetic acid applied to cervix for >45 seconds.  Cervix visualized with both 7.5X and 15X magnification.  Green filter also used.  Lugols solution was not used.  Findings:  Area of AWE noted at 7 o'clock with HPV type changes as noted.  Biopsy:  At 7 o'clock.  ECC:  was performed.  Monsel's was not needed.  Excellent hemostasis was present.  Pt tolerated procedure well and all instruments were removed.  Findings noted above on picture of cervix.  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  Plan:  Pathology results will be called to patient and follow-up planned pending results.

## 2017-12-13 NOTE — Patient Instructions (Signed)

## 2017-12-17 ENCOUNTER — Telehealth: Payer: Self-pay | Admitting: Obstetrics & Gynecology

## 2017-12-17 ENCOUNTER — Encounter: Payer: Self-pay | Admitting: Obstetrics & Gynecology

## 2017-12-17 ENCOUNTER — Ambulatory Visit (INDEPENDENT_AMBULATORY_CARE_PROVIDER_SITE_OTHER): Payer: 59 | Admitting: *Deleted

## 2017-12-17 DIAGNOSIS — J309 Allergic rhinitis, unspecified: Secondary | ICD-10-CM | POA: Diagnosis not present

## 2017-12-17 DIAGNOSIS — M25511 Pain in right shoulder: Secondary | ICD-10-CM | POA: Diagnosis not present

## 2017-12-17 DIAGNOSIS — M25519 Pain in unspecified shoulder: Secondary | ICD-10-CM | POA: Diagnosis not present

## 2017-12-17 MED ORDER — ALPRAZOLAM 0.5 MG PO TABS: 0.5000 mg | ORAL_TABLET | Freq: Two times a day (BID) | ORAL | 0 refills | Status: DC | PRN

## 2017-12-17 NOTE — Telephone Encounter (Signed)
Rx for Xanax 0.5 mg tablets take 1 tablet by mouth 2 times daily as needed for anxiety #30 was sent on 02/08/2017. Patent is requesting a refill due to intermittent anxiety since her colposcopy. Please advise.

## 2017-12-17 NOTE — Telephone Encounter (Signed)
Rx completed and pt notified via mychart since it is after 5pm.

## 2017-12-17 NOTE — Telephone Encounter (Signed)
Patient sent the following correspondence through [facility]. Routing to triage to assist patient with request.  ----- Message from [place], Generic sent at 12/17/2017 6:17 AM EDT -----    So I've been really stressed out since the biopsy.  Anxiety I mean. I have 3 xanax left from the last script.  I've been taking 1/2 pill to get through.  Can you refill it? Anxiety comes and goes.     Thanks.

## 2017-12-20 ENCOUNTER — Ambulatory Visit (INDEPENDENT_AMBULATORY_CARE_PROVIDER_SITE_OTHER): Payer: 59

## 2017-12-20 DIAGNOSIS — J309 Allergic rhinitis, unspecified: Secondary | ICD-10-CM

## 2017-12-23 ENCOUNTER — Ambulatory Visit (INDEPENDENT_AMBULATORY_CARE_PROVIDER_SITE_OTHER): Payer: 59

## 2017-12-23 DIAGNOSIS — J309 Allergic rhinitis, unspecified: Secondary | ICD-10-CM

## 2017-12-30 ENCOUNTER — Ambulatory Visit (INDEPENDENT_AMBULATORY_CARE_PROVIDER_SITE_OTHER): Payer: 59 | Admitting: *Deleted

## 2017-12-30 DIAGNOSIS — J309 Allergic rhinitis, unspecified: Secondary | ICD-10-CM

## 2017-12-31 ENCOUNTER — Encounter: Payer: Self-pay | Admitting: Obstetrics & Gynecology

## 2018-01-01 DIAGNOSIS — H903 Sensorineural hearing loss, bilateral: Secondary | ICD-10-CM | POA: Diagnosis not present

## 2018-01-02 ENCOUNTER — Ambulatory Visit (INDEPENDENT_AMBULATORY_CARE_PROVIDER_SITE_OTHER): Payer: 59

## 2018-01-02 DIAGNOSIS — J309 Allergic rhinitis, unspecified: Secondary | ICD-10-CM

## 2018-01-07 ENCOUNTER — Telehealth: Payer: Self-pay | Admitting: Obstetrics & Gynecology

## 2018-01-07 NOTE — Telephone Encounter (Signed)
Return call to patient. Date options for end of year discussed. Patient aware of scheduling/cancellation policy.  Will call back when ready to proceed with date.  Routing to provider for final review.  Will close encounter.

## 2018-01-07 NOTE — Telephone Encounter (Signed)
Patient is asking to talk with [name] about scheduling a hysterectomy.

## 2018-01-13 ENCOUNTER — Ambulatory Visit (INDEPENDENT_AMBULATORY_CARE_PROVIDER_SITE_OTHER): Payer: 59

## 2018-01-13 ENCOUNTER — Telehealth: Payer: Self-pay | Admitting: Obstetrics & Gynecology

## 2018-01-13 DIAGNOSIS — J309 Allergic rhinitis, unspecified: Secondary | ICD-10-CM | POA: Diagnosis not present

## 2018-01-13 NOTE — Telephone Encounter (Signed)
Patient called. Requested to speak to [name] to schedule surgery, interested in 04/01/18 surgery date.

## 2018-01-14 ENCOUNTER — Other Ambulatory Visit: Payer: Self-pay | Admitting: *Deleted

## 2018-01-14 ENCOUNTER — Encounter: Payer: Self-pay | Admitting: Obstetrics & Gynecology

## 2018-01-14 DIAGNOSIS — N926 Irregular menstruation, unspecified: Secondary | ICD-10-CM

## 2018-01-14 DIAGNOSIS — Z8041 Family history of malignant neoplasm of ovary: Secondary | ICD-10-CM

## 2018-01-14 NOTE — Telephone Encounter (Signed)
Patient called requesting to speak with [name].

## 2018-01-14 NOTE — Telephone Encounter (Signed)
Patient calling to schedule surgery

## 2018-01-14 NOTE — Telephone Encounter (Signed)
Call to patient. Confirmed surgery scheduled for 04-01-18 at 0730 at [facility]. Surgery instruction sheet reviewed and printed copy will be mailed to patient.  Routing to provider for final review.  Will close encounter.

## 2018-01-14 NOTE — Telephone Encounter (Signed)
Return call to patient. Per ROI, can leave message on voice mail. Confirmed number on phone log.  Left message received request for 04-01-18 and will proceed with scheduling. Call back at her convenience to complete surgery instructions.

## 2018-01-14 NOTE — Telephone Encounter (Signed)
Patient is returning a call to [place]. Patient states she will be available after 11:15am.

## 2018-01-14 NOTE — Telephone Encounter (Signed)
Spoke with patient regarding benefit for surgery. Patient understood and agreeable. Patient has confirmed and is ready to proceed with scheduling. Patient aware this is professional benefit only. Patient aware will be contacted by hospital for separate benefits. Forwarding to Nurse Supervisor for scheduling ° °Routing to Sally Yeakley, RN °

## 2018-01-14 NOTE — Progress Notes (Signed)
Orders for surgery consult appointment per Dr Sabra Heck.

## 2018-01-15 ENCOUNTER — Ambulatory Visit (INDEPENDENT_AMBULATORY_CARE_PROVIDER_SITE_OTHER): Payer: 59 | Admitting: *Deleted

## 2018-01-15 ENCOUNTER — Other Ambulatory Visit: Payer: Self-pay | Admitting: Obstetrics & Gynecology

## 2018-01-15 DIAGNOSIS — J309 Allergic rhinitis, unspecified: Secondary | ICD-10-CM | POA: Diagnosis not present

## 2018-01-20 DIAGNOSIS — H903 Sensorineural hearing loss, bilateral: Secondary | ICD-10-CM | POA: Diagnosis not present

## 2018-01-20 DIAGNOSIS — Z45321 Encounter for adjustment and management of cochlear device: Secondary | ICD-10-CM | POA: Diagnosis not present

## 2018-01-21 ENCOUNTER — Ambulatory Visit (INDEPENDENT_AMBULATORY_CARE_PROVIDER_SITE_OTHER): Payer: 59 | Admitting: *Deleted

## 2018-01-21 DIAGNOSIS — J309 Allergic rhinitis, unspecified: Secondary | ICD-10-CM

## 2018-01-23 ENCOUNTER — Ambulatory Visit (INDEPENDENT_AMBULATORY_CARE_PROVIDER_SITE_OTHER): Payer: 59 | Admitting: *Deleted

## 2018-01-23 DIAGNOSIS — J309 Allergic rhinitis, unspecified: Secondary | ICD-10-CM

## 2018-01-28 ENCOUNTER — Ambulatory Visit (INDEPENDENT_AMBULATORY_CARE_PROVIDER_SITE_OTHER): Payer: 59 | Admitting: *Deleted

## 2018-01-28 DIAGNOSIS — J309 Allergic rhinitis, unspecified: Secondary | ICD-10-CM

## 2018-01-30 ENCOUNTER — Ambulatory Visit (INDEPENDENT_AMBULATORY_CARE_PROVIDER_SITE_OTHER): Payer: 59 | Admitting: *Deleted

## 2018-01-30 DIAGNOSIS — J309 Allergic rhinitis, unspecified: Secondary | ICD-10-CM | POA: Diagnosis not present

## 2018-02-03 ENCOUNTER — Ambulatory Visit (INDEPENDENT_AMBULATORY_CARE_PROVIDER_SITE_OTHER): Payer: 59

## 2018-02-03 DIAGNOSIS — J309 Allergic rhinitis, unspecified: Secondary | ICD-10-CM | POA: Diagnosis not present

## 2018-02-06 ENCOUNTER — Ambulatory Visit (INDEPENDENT_AMBULATORY_CARE_PROVIDER_SITE_OTHER): Payer: 59 | Admitting: *Deleted

## 2018-02-06 DIAGNOSIS — J309 Allergic rhinitis, unspecified: Secondary | ICD-10-CM

## 2018-02-10 ENCOUNTER — Ambulatory Visit (INDEPENDENT_AMBULATORY_CARE_PROVIDER_SITE_OTHER): Payer: 59 | Admitting: *Deleted

## 2018-02-10 DIAGNOSIS — J309 Allergic rhinitis, unspecified: Secondary | ICD-10-CM

## 2018-02-13 ENCOUNTER — Ambulatory Visit (INDEPENDENT_AMBULATORY_CARE_PROVIDER_SITE_OTHER): Payer: 59 | Admitting: *Deleted

## 2018-02-13 DIAGNOSIS — J309 Allergic rhinitis, unspecified: Secondary | ICD-10-CM

## 2018-02-25 ENCOUNTER — Ambulatory Visit (INDEPENDENT_AMBULATORY_CARE_PROVIDER_SITE_OTHER): Payer: 59 | Admitting: *Deleted

## 2018-02-25 DIAGNOSIS — J309 Allergic rhinitis, unspecified: Secondary | ICD-10-CM

## 2018-03-04 ENCOUNTER — Ambulatory Visit (INDEPENDENT_AMBULATORY_CARE_PROVIDER_SITE_OTHER): Payer: 59 | Admitting: *Deleted

## 2018-03-04 DIAGNOSIS — J309 Allergic rhinitis, unspecified: Secondary | ICD-10-CM

## 2018-03-05 ENCOUNTER — Other Ambulatory Visit: Payer: Self-pay | Admitting: *Deleted

## 2018-03-05 ENCOUNTER — Other Ambulatory Visit: Payer: Self-pay | Admitting: Allergy and Immunology

## 2018-03-05 DIAGNOSIS — J4530 Mild persistent asthma, uncomplicated: Secondary | ICD-10-CM

## 2018-03-05 MED ORDER — ALBUTEROL SULFATE HFA 108 (90 BASE) MCG/ACT IN AERS: 2.0000 | INHALATION_SPRAY | RESPIRATORY_TRACT | 1 refills | Status: DC | PRN

## 2018-03-05 NOTE — Telephone Encounter (Signed)
Courtesy refill sent in for albuterol appt made to see Dr Verlin Fester on 03/18/18 @ 10:30

## 2018-03-06 ENCOUNTER — Ambulatory Visit (INDEPENDENT_AMBULATORY_CARE_PROVIDER_SITE_OTHER): Payer: 59 | Admitting: *Deleted

## 2018-03-06 DIAGNOSIS — J309 Allergic rhinitis, unspecified: Secondary | ICD-10-CM | POA: Diagnosis not present

## 2018-03-11 ENCOUNTER — Ambulatory Visit: Payer: 59 | Admitting: Obstetrics & Gynecology

## 2018-03-11 ENCOUNTER — Ambulatory Visit (INDEPENDENT_AMBULATORY_CARE_PROVIDER_SITE_OTHER): Payer: 59 | Admitting: Obstetrics & Gynecology

## 2018-03-11 ENCOUNTER — Other Ambulatory Visit: Payer: Self-pay

## 2018-03-11 ENCOUNTER — Ambulatory Visit (INDEPENDENT_AMBULATORY_CARE_PROVIDER_SITE_OTHER): Payer: 59

## 2018-03-11 ENCOUNTER — Encounter: Payer: Self-pay | Admitting: Obstetrics & Gynecology

## 2018-03-11 VITALS — BP 116/72 | HR 76 | Resp 14 | Ht 65.5 in | Wt 154.8 lb

## 2018-03-11 DIAGNOSIS — Z8041 Family history of malignant neoplasm of ovary: Secondary | ICD-10-CM

## 2018-03-11 DIAGNOSIS — N898 Other specified noninflammatory disorders of vagina: Secondary | ICD-10-CM | POA: Diagnosis not present

## 2018-03-11 DIAGNOSIS — N926 Irregular menstruation, unspecified: Secondary | ICD-10-CM

## 2018-03-11 DIAGNOSIS — N858 Other specified noninflammatory disorders of uterus: Secondary | ICD-10-CM | POA: Diagnosis not present

## 2018-03-11 NOTE — Progress Notes (Signed)
42 y.o. G2P1 DivorcedCaucasian female here to discuss desires to proceed with hysterectomy.  She's been considering this off and on for the past several years due to endometriosis and irregular bleeding issues.  She does have an IUD placed and has continued to have irregular bleeding.  She also has a family hx of ovarian cancer and her mother died from this at aged 95.  Pt did genetic testing in 2014 that was negative.    She recently called and indicated she was ready to proceed with surgery.  We have not reviewed anything about surgery so she is here for this today s well as PUS (that we have been doing yearly due to her mother's hx) as well as an endometrial biopsy due to the irregular bleeding.   We discussed different approaches for hysterectomy and I feel TLH is the best option for her.  Hospital stay, recovery and pain management all discussed.  Risks discussed including but not limited to bleeding, 1% risk of receiving a  transfusion, infection, 3-4% risk of bowel/bladder/ureteral/vascular injury discussed as well as possible need for additional surgery if injury does occur discussed.  DVT/PE and rare risk of death discussed.  My actual complications with prior surgeries discussed.  Vaginal cuff dehiscence discussed.  Hernia formation discussed.  Positioning and incision locations discussed.  Patient aware if pathology abnormal she may need additional treatment.  All questions answered.    Ultrasound reviewed.  Uterus 8 x 4.5 x 4.2cm.  Endometrium 4.21mm with IUD in correct location.  Left ovary 3.5 x 2.1 x 1.8cm with 1.7cm resolving corpus luteal cyst and right ovary 2.5 x 1.8 x 1.3cm.  No free fluid noted.      Ob Hx:   No LMP recorded. (Menstrual status: IUD).          Sexually active: No.  Birth control: IUD Mirena placed 09/19/17 Last pap: 12/06/17 LSIL. Colpo Atypia and borderline CIN1 Last MMG: 04/26/17 BIRADS2:Benign  Tobacco: No  Past Surgical History:  Procedure Laterality Date  .  BREAST REDUCTION SURGERY  9/10  . COCHLEAR IMPLANT Left 2013   moval of Envoy device  . IMPLANTATION OF ENVOY ESTEEM HEARING DEVICE     for hearing loss  . TUBAL LIGATION  10/10   cautery of endometriosis    Past Medical History:  Diagnosis Date  . Abnormal Pap smear    history of colposcopy ECC with atypical squamous metaplasia  . Anxiety   . Asthma   . Congenital hearing loss    started age 65  . Endometriosis 10/10  . Hx of migraines    increased frequency with estrogen containing OCPs  . IUD (intrauterine device) in place   . Panic attacks   . Recurrent upper respiratory infection (URI)     Allergies: Augmentin [amoxicillin-pot clavulanate]; Ciprofloxacin; Codeine; Shrimp [shellfish allergy]; and Sulfa antibiotics  Current Outpatient Medications  Medication Sig Dispense Refill  . albuterol (VENTOLIN HFA) 108 (90 Base) MCG/ACT inhaler Inhale 2 puffs into the lungs every 4 (four) hours as needed for wheezing or shortness of breath. 1 Inhaler 1  . ALPRAZolam (XANAX) 0.5 MG tablet Take 1 tablet (0.5 mg total) by mouth 2 (two) times daily as needed for anxiety. 30 tablet 0  . azelastine (ASTELIN) 0.1 % nasal spray SPRAY 2 SPRAYS INTO EACH NOSTRIL TWICE DAILY 30 mL 2  . Cholecalciferol (VITAMIN D PO) Take 2,000 Int'l Units by mouth daily.    . cyclobenzaprine (FLEXERIL) 10 MG tablet Take 1 tablet (  10 mg total) 3 (three) times daily as needed by mouth for muscle spasms. 30 tablet 0  . ELDERBERRY PO Take by mouth daily.    Marland Kitchen Fexofenadine HCl (ALLEGRA PO) Take by mouth as needed.    . fluticasone (FLOVENT HFA) 110 MCG/ACT inhaler Inhale 1 puff into the lungs 2 (two) times daily. 12 g 5  . Fluticasone Propionate (XHANCE) 93 MCG/ACT EXHU Place 2 puffs into both nostrils 2 (two) times daily. 16 mL 12  . Multiple Vitamins-Minerals (MULTIVITAMIN GUMMIES ADULT PO) Take by mouth daily. Super food    . Olopatadine HCl 0.2 % SOLN daily as needed.    . Probiotic Product (PROBIOTIC-10) CHEW  Chew by mouth daily.    . Turmeric 500 MG CAPS Take by mouth daily.     No current facility-administered medications for this visit.     ROS: Pertinent items noted in HPI and remainder of comprehensive ROS otherwise negative.  Exam:    BP 116/72 (BP Location: Right Arm, Patient Position: Sitting, Cuff Size: Normal)   Pulse 76   Resp 14   Ht 5' 5.5" (1.664 m)   Wt 154 lb 12.8 oz (70.2 kg)   BMI 25.37 kg/m   General appearance: alert and cooperative Head: Normocephalic, without obvious abnormality, atraumatic Neck: no adenopathy, supple, symmetrical, trachea midline and thyroid not enlarged, symmetric, no tenderness/mass/nodules Lungs: clear to auscultation bilaterally Heart: regular rate and rhythm, S1, S2 normal, no murmur, click, rub or gallop Abdomen: soft, non-tender; bowel sounds normal; no masses,  no organomegaly Extremities: extremities normal, atraumatic, no cyanosis or edema Skin: Skin color, texture, turgor normal. No rashes or lesions Lymph nodes: Cervical, supraclavicular, and axillary nodes normal. no inguinal nodes palpated Neurologic: Grossly normal  Pelvic: External genitalia:  no lesions              Urethra: normal appearing urethra with no masses, tenderness or lesions              Bartholins and Skenes: normal                 Vagina: normal appearing vagina with watery discharge noted today,  no lesions              Cervix: normal appearance              Pap taken: No.        Bimanual Exam:  Uterus:  uterus is normal size, shape, consistency and nontender                                      Adnexa:    normal adnexa in size, nontender and no masses                                      Rectovaginal: Deferred                                      Anus:  normal sphincter tone, no lesions  Endometrial biopsy recommended.  Discussed with patient.  Verbal and written consent obtained.   Procedure:  Speculum placed.  Cervix visualized and cleansed with betadine  prep.  A single toothed tenaculum was applied to the anterior lip of the cervix.  Endometrial pipelle was advanced through the cervix into the endometrial cavity without difficulty.  Pipelle passed to 7.5cm.  Suction applied and pipelle removed with good tissue sample obtained.  Tenculum removed.  No bleeding noted.  Patient tolerated procedure well.   A: Irregular bleeding with Mirena IUD H/O endometriosis Mother with hx of ovarian cancer Vaginal discharge  P:  TLH/bilateral salpingectomy/possible BSO, cystoscopy planned Endometrial biopsy pending.  Results will be called to the pt. Hysterectomy brochure given for pre and post op instructions. Affirm pending  In additional to the discussion of surgery, additional 15 minutes spent reviewed ultrasound findings as well as proceeding with endometrial biopsy today.

## 2018-03-12 LAB — VAGINITIS/VAGINOSIS, DNA PROBE
Candida Species: NEGATIVE
Gardnerella vaginalis: NEGATIVE
TRICHOMONAS VAG: NEGATIVE

## 2018-03-13 ENCOUNTER — Ambulatory Visit (INDEPENDENT_AMBULATORY_CARE_PROVIDER_SITE_OTHER): Payer: 59 | Admitting: *Deleted

## 2018-03-13 ENCOUNTER — Encounter: Payer: Self-pay | Admitting: Obstetrics & Gynecology

## 2018-03-13 ENCOUNTER — Telehealth: Payer: Self-pay | Admitting: Obstetrics & Gynecology

## 2018-03-13 DIAGNOSIS — J309 Allergic rhinitis, unspecified: Secondary | ICD-10-CM

## 2018-03-13 NOTE — Telephone Encounter (Signed)
Message   Just an Margaret Ford Rainier that Margaret Ford is handling my disability claim and that they will be contacting your office for info regarding my leave for hysterectomy.  I think I mentioned it when I was there but just want to cover all the bases.  Office is closed for lunch. Thanks!  Margaret Ford

## 2018-03-16 ENCOUNTER — Encounter: Payer: Self-pay | Admitting: Obstetrics & Gynecology

## 2018-03-17 ENCOUNTER — Encounter: Payer: Self-pay | Admitting: Obstetrics & Gynecology

## 2018-03-17 NOTE — Patient Instructions (Addendum)
Your procedure is scheduled on: Tuesday, 9/24  Enter through the Main Entrance of [facility] at: 6 am  Pick up the phone at the desk and dial [phone].  Call this number if you have problems the morning of surgery: [phone].  Remember: Do NOT eat food or Do NOT drink clear liquids (including water) after midnight Monday.  Take these medicines the morning of surgery with a SIP OF WATER: Allegra-D and xanax if needed.  Ok to use flovent inhaler and nasal spray if needed.  Brush your teeth on the day of surgery.  Bring Albuterol inhaler with you on day of surgery.  Stop herbal medications, vitamin supplements, Ibuprofen/NSAIDS 1 week prior to surgery -03/26/18.  Do NOT wear jewelry (body piercing), metal hair clips/bobby pins, make-up, or nail polish. Do NOT wear lotions, powders, or perfumes.  You may wear deoderant. Do NOT shave for 48 hours prior to surgery. Do NOT bring valuables to the hospital.  Leave suitcase in car.  After surgery it may be brought to your room.  For patients admitted to the hospital, checkout time is 11:00 AM the day of discharge. Home with Daughter Ronalee Belts cell [phone].

## 2018-03-18 ENCOUNTER — Ambulatory Visit (INDEPENDENT_AMBULATORY_CARE_PROVIDER_SITE_OTHER): Payer: 59 | Admitting: Allergy and Immunology

## 2018-03-18 ENCOUNTER — Encounter: Payer: Self-pay | Admitting: Allergy and Immunology

## 2018-03-18 VITALS — BP 112/72 | HR 71 | Resp 18

## 2018-03-18 DIAGNOSIS — J4530 Mild persistent asthma, uncomplicated: Secondary | ICD-10-CM

## 2018-03-18 DIAGNOSIS — J4531 Mild persistent asthma with (acute) exacerbation: Secondary | ICD-10-CM | POA: Diagnosis not present

## 2018-03-18 DIAGNOSIS — J3089 Other allergic rhinitis: Secondary | ICD-10-CM

## 2018-03-18 DIAGNOSIS — R430 Anosmia: Secondary | ICD-10-CM | POA: Diagnosis not present

## 2018-03-18 MED ORDER — FLUTICASONE PROPIONATE 93 MCG/ACT NA EXHU: 2.0000 | INHALANT_SUSPENSION | Freq: Two times a day (BID) | NASAL | 12 refills | Status: DC

## 2018-03-18 MED ORDER — ALBUTEROL SULFATE HFA 108 (90 BASE) MCG/ACT IN AERS: 2.0000 | INHALATION_SPRAY | RESPIRATORY_TRACT | 1 refills | Status: DC | PRN

## 2018-03-18 MED ORDER — FLUTICASONE PROPIONATE HFA 110 MCG/ACT IN AERO: 1.0000 | INHALATION_SPRAY | Freq: Two times a day (BID) | RESPIRATORY_TRACT | 5 refills | Status: DC

## 2018-03-18 NOTE — Telephone Encounter (Signed)
Message   Hi,    Last time, I promise. The attached needs to be filled out and submitted to [facility]. I can bring it to the office or can email it to someone in the office. I wasn't sure who gets it.    Attending Physicians Statement I already told them I would be out 6 weeks, so I go back to see you November 5th. If all goes well, I'm assuming I can return to work November 6th.    Thanks,  Laron   See attachment

## 2018-03-18 NOTE — Assessment & Plan Note (Signed)
Well-controlled.  We will stepdown therapy at this time.  Decrease Flovent 110 g to 1 inhalation via spacer device twice a day.  If lower respiratory symptoms progress in frequency and/or severity, the patient is to resume the previous dose.  Continue albuterol HFA, 1 to 2 inhalations every 4-6 hours if needed.  Subjective and objective measures of pulmonary function will be followed and the treatment plan will be adjusted accordingly.

## 2018-03-18 NOTE — Telephone Encounter (Signed)
Call placed to patient to review process for FMLA and Disability forms. Left a voicemail message requesting a return call

## 2018-03-18 NOTE — Progress Notes (Signed)
Follow-up Note  RE: Margaret Ford MRN: [id number] DOB: May 31, 1976 Date of Office Visit: 03/18/2018  Primary care provider: [name], MD Referring provider: [name], MD  History of present illness: Margaret Ford is a 42 y.o. female with persistent asthma, allergic rhinitis, and history of food allergy presenting today for follow-up.  She was last seen in this clinic on August 27, 2017.  She reports significant symptom reduction from immunotherapy injections stating, "I can actually go outside, not living like a mole."  She rarely requires albuterol rescue.  In fact, she believes that she has only required albuterol rescue on one occasion in the interval since her previous visit.  She does not experience nocturnal awakenings due to lower respiratory symptoms or limitations in normal daily activities.  She is currently taking Flovent 110 g, 2 inhalations via spacer device twice daily.  Assessment and plan: Mild persistent asthma Well-controlled.  We will stepdown therapy at this time.  Decrease Flovent 110 g to 1 inhalation via spacer device twice a day.  If lower respiratory symptoms progress in frequency and/or severity, the patient is to resume the previous dose.  Continue albuterol HFA, 1 to 2 inhalations every 4-6 hours if needed.  Subjective and objective measures of pulmonary function will be followed and the treatment plan will be adjusted accordingly.  Allergic rhinitis Stable.  Continue appropriate allergen avoidance measures, immunotherapy injections as prescribed, Xhance daily, and nasal saline spray as needed and prior to medicated nasal sprays.    Meds ordered this encounter  Medications  . albuterol (VENTOLIN HFA) 108 (90 Base) MCG/ACT inhaler    Sig: Inhale 2 puffs into the lungs every 4 (four) hours as needed for wheezing or shortness of breath.    Dispense:  1 Inhaler    Refill:  1  . fluticasone (FLOVENT HFA) 110 MCG/ACT  inhaler    Sig: Inhale 1 puff into the lungs 2 (two) times daily.    Dispense:  12 g    Refill:  5    Pt needs office visit before next refill  . Fluticasone Propionate (XHANCE) 93 MCG/ACT EXHU    Sig: Place 2 puffs into both nostrils 2 (two) times daily.    Dispense:  16 mL    Refill:  12    [phone]    Diagnostics: Spirometry:  Normal with an FEV1 of 110% predicted.  Please see scanned spirometry results for details.    Physical examination: Blood pressure 112/72, pulse 71, resp. rate 18, SpO2 95 %.  General: Alert, interactive, in no acute distress. HEENT: Turbinates minimally edematous without discharge, post-pharynx unremarkable. Neck: Supple without lymphadenopathy. Lungs: Clear to auscultation without wheezing, rhonchi or rales. CV: Normal S1, S2 without murmurs. Skin: Warm and dry, without lesions or rashes.  The following portions of the patient's history were reviewed and updated as appropriate: allergies, current medications, past family history, past medical history, past social history, past surgical history and problem list.  Allergies as of 03/18/2018      Reactions   Augmentin [amoxicillin-pot Clavulanate] Diarrhea   Ciprofloxacin Diarrhea   Codeine Other (See Comments)   "sick" - ok to take with nausea med   Shrimp [shellfish Allergy]    Sulfa Antibiotics Other (See Comments)   Generalized burning   Z-pak [azithromycin] Other (See Comments)   thrush      Medication List        Accurate as of 03/18/18  1:20 PM. Always use your most recent med  list.          albuterol 108 (90 Base) MCG/ACT inhaler Commonly known as:  PROVENTIL HFA;VENTOLIN HFA Inhale 2 puffs into the lungs every 4 (four) hours as needed for wheezing or shortness of breath.   ALPRAZolam 0.5 MG tablet Commonly known as:  XANAX Take 1 tablet (0.5 mg total) by mouth 2 (two) times daily as needed for anxiety.   azelastine 0.1 % nasal spray Commonly known as:  ASTELIN SPRAY 2  SPRAYS INTO EACH NOSTRIL TWICE DAILY   CALCIUM 600 PO Take 1 tablet by mouth daily.   diclofenac sodium 1 % Gel Commonly known as:  VOLTAREN Apply 1 application topically 3 (three) times daily as needed (shoulder pain).   ELDERBERRY PO Take 1 tablet by mouth daily as needed (for cold).   fexofenadine-pseudoephedrine 180-240 MG 24 hr tablet Commonly known as:  ALLEGRA-D 24 Take 1 tablet by mouth daily.   fluticasone 110 MCG/ACT inhaler Commonly known as:  FLOVENT HFA Inhale 1 puff into the lungs 2 (two) times daily.   Fluticasone Propionate 93 MCG/ACT Exhu Place 2 puffs into both nostrils 2 (two) times daily.   folic acid 496 MCG tablet Commonly known as:  FOLVITE Take 400 mcg by mouth daily.   MULTIVITAMIN GUMMIES ADULT PO Take 1 each by mouth daily. Super food   PROBIOTIC-10 Chew Chew 1 each by mouth daily.   Vitamin D 2000 units tablet Take 2,000 Units by mouth daily.       Allergies  Allergen Reactions  . Augmentin [Amoxicillin-Pot Clavulanate] Diarrhea  . Ciprofloxacin Diarrhea  . Codeine Other (See Comments)    "sick" - ok to take with nausea med  . Shrimp [Shellfish Allergy]   . Sulfa Antibiotics Other (See Comments)    Generalized burning  . Z-Pak [Azithromycin] Other (See Comments)    thrush    I appreciate the opportunity to take part in Margaret Ford's care. Please do not hesitate to contact me with questions.  Sincerely,   R. Edgar Frisk, MD

## 2018-03-18 NOTE — Telephone Encounter (Signed)
Patient returning call.

## 2018-03-18 NOTE — Patient Instructions (Addendum)
Mild persistent asthma Well-controlled.  We will stepdown therapy at this time.  Decrease Flovent 110 g to 1 inhalation via spacer device twice a day.  If lower respiratory symptoms progress in frequency and/or severity, the patient is to resume the previous dose.  Continue albuterol HFA, 1 to 2 inhalations every 4-6 hours if needed.  Subjective and objective measures of pulmonary function will be followed and the treatment plan will be adjusted accordingly.  Allergic rhinitis Stable.  Continue appropriate allergen avoidance measures, immunotherapy injections as prescribed, Xhance daily, and nasal saline spray as needed and prior to medicated nasal sprays.    Return in about 5 months (around 08/18/2018), or if symptoms worsen or fail to improve.

## 2018-03-18 NOTE — Assessment & Plan Note (Signed)
Stable.  Continue appropriate allergen avoidance measures, immunotherapy injections as prescribed, Xhance daily, and nasal saline spray as needed and prior to medicated nasal sprays.  

## 2018-03-20 NOTE — Telephone Encounter (Signed)
Patient returned call. Answered questions regarding process for FMLA and Disability. Patient states she will bring forms by office for completion. Will close encoutner

## 2018-03-20 NOTE — Telephone Encounter (Signed)
Call placed to patient to review process for FMLA and Disability forms. Left voicemail message requesting a return call

## 2018-03-21 ENCOUNTER — Encounter (HOSPITAL_COMMUNITY)
Admission: RE | Admit: 2018-03-21 | Discharge: 2018-03-21 | Disposition: A | Discharge status: Home / Self Care | Payer: 59 | Source: Ambulatory Visit | Attending: Obstetrics & Gynecology | Admitting: Obstetrics & Gynecology

## 2018-03-21 ENCOUNTER — Encounter (HOSPITAL_COMMUNITY): Payer: Self-pay

## 2018-03-21 ENCOUNTER — Other Ambulatory Visit: Payer: Self-pay

## 2018-03-21 DIAGNOSIS — Z01812 Encounter for preprocedural laboratory examination: Secondary | ICD-10-CM | POA: Insufficient documentation

## 2018-03-21 DIAGNOSIS — Z0289 Encounter for other administrative examinations: Secondary | ICD-10-CM

## 2018-03-21 HISTORY — DX: Other seasonal allergic rhinitis: J30.2

## 2018-03-21 HISTORY — DX: Major depressive disorder, single episode, unspecified: F32.9

## 2018-03-21 HISTORY — DX: Unspecified hearing loss, bilateral: H91.93

## 2018-03-21 HISTORY — DX: Headache: R51

## 2018-03-21 HISTORY — DX: Bursitis of right shoulder: M75.51

## 2018-03-21 HISTORY — DX: Depression, unspecified: F32.A

## 2018-03-21 LAB — CBC
HCT: 39.4 % (ref 36.0–46.0)
HEMOGLOBIN: 13.5 g/dL (ref 12.0–15.0)
MCH: 31.8 pg (ref 26.0–34.0)
MCHC: 34.3 g/dL (ref 30.0–36.0)
MCV: 92.9 fL (ref 78.0–100.0)
Platelets: 232 10*3/uL (ref 150–400)
RBC: 4.24 MIL/uL (ref 3.87–5.11)
RDW: 12.1 % (ref 11.5–15.5)
WBC: 6.4 10*3/uL (ref 4.0–10.5)

## 2018-03-27 ENCOUNTER — Telehealth: Payer: Self-pay | Admitting: Obstetrics & Gynecology

## 2018-03-27 NOTE — Telephone Encounter (Signed)
Patient has questions about her disability paperwork.

## 2018-03-28 ENCOUNTER — Ambulatory Visit (INDEPENDENT_AMBULATORY_CARE_PROVIDER_SITE_OTHER): Payer: 59

## 2018-03-28 DIAGNOSIS — J309 Allergic rhinitis, unspecified: Secondary | ICD-10-CM | POA: Diagnosis not present

## 2018-03-31 NOTE — Telephone Encounter (Signed)
Call returned to patient. Left voicemail message requesting a return call

## 2018-03-31 NOTE — Telephone Encounter (Signed)
Returned call to patient to confirm disability forms were completed and faxed to [facility] to reflect estimated return to work date is 05/13/18, six weeks post operative. Patients states her disability only approved her out of work to 04/28/18. Patient to call and discuss with her Human Resources and states if any further assistance needed, she will contact our office.   Forwarding to Dr Sabra Heck for review. Will close encounter

## 2018-03-31 NOTE — Anesthesia Preprocedure Evaluation (Addendum)
Anesthesia Evaluation  Patient identified by MRN, date of birth, ID band Patient awake    Reviewed: Allergy & Precautions, H&P , NPO status , Patient's Chart, lab work & pertinent test results, reviewed documented beta blocker date and time   History of Anesthesia Complications (+) PROLONGED EMERGENCE  Airway Mallampati: II  TM Distance: >3 FB Neck ROM: full    Dental no notable dental hx.    Pulmonary    Pulmonary exam normal breath sounds clear to auscultation       Cardiovascular Exercise Tolerance: Good negative cardio ROS   Rhythm:regular Rate:Normal     Neuro/Psych  Headaches, PSYCHIATRIC DISORDERS Anxiety Depression    GI/Hepatic negative GI ROS, Neg liver ROS,   Endo/Other  negative endocrine ROS  Renal/GU negative Renal ROS  negative genitourinary   Musculoskeletal   Abdominal   Peds  Hematology negative hematology ROS (+)   Anesthesia Other Findings   Reproductive/Obstetrics negative OB ROS                            Anesthesia Physical Anesthesia Plan  ASA: II  Anesthesia Plan: General   Post-op Pain Management:    Induction: Intravenous  PONV Risk Score and Plan: 3 and Ondansetron, Dexamethasone, Treatment may vary due to age or medical condition and Scopolamine patch - Pre-op  Airway Management Planned: Oral ETT  Additional Equipment:   Intra-op Plan:   Post-operative Plan: Extubation in OR  Informed Consent: I have reviewed the patients History and Physical, chart, labs and discussed the procedure including the risks, benefits and alternatives for the proposed anesthesia with the patient or authorized representative who has indicated his/her understanding and acceptance.   Dental Advisory Given  Plan Discussed with: CRNA, Anesthesiologist and Surgeon  Anesthesia Plan Comments: (  )        Anesthesia Quick Evaluation

## 2018-03-31 NOTE — Telephone Encounter (Signed)
Return call to Suzy. °

## 2018-04-01 ENCOUNTER — Ambulatory Visit (HOSPITAL_COMMUNITY): Payer: 59 | Admitting: Anesthesiology

## 2018-04-01 ENCOUNTER — Encounter (HOSPITAL_COMMUNITY): Payer: Self-pay | Admitting: *Deleted

## 2018-04-01 ENCOUNTER — Other Ambulatory Visit: Payer: Self-pay

## 2018-04-01 ENCOUNTER — Encounter (HOSPITAL_COMMUNITY)
Admission: RE | Disposition: A | Discharge status: Home / Self Care | Payer: Self-pay | Source: Ambulatory Visit | Attending: Obstetrics & Gynecology

## 2018-04-01 ENCOUNTER — Ambulatory Visit (HOSPITAL_COMMUNITY)
Admission: RE | Admit: 2018-04-01 | Discharge: 2018-04-01 | Disposition: A | Discharge status: Home / Self Care | Payer: 59 | Source: Ambulatory Visit | Attending: Obstetrics & Gynecology | Admitting: Obstetrics & Gynecology

## 2018-04-01 DIAGNOSIS — Z79899 Other long term (current) drug therapy: Secondary | ICD-10-CM | POA: Diagnosis not present

## 2018-04-01 DIAGNOSIS — N939 Abnormal uterine and vaginal bleeding, unspecified: Secondary | ICD-10-CM | POA: Insufficient documentation

## 2018-04-01 DIAGNOSIS — J45909 Unspecified asthma, uncomplicated: Secondary | ICD-10-CM | POA: Insufficient documentation

## 2018-04-01 DIAGNOSIS — F419 Anxiety disorder, unspecified: Secondary | ICD-10-CM | POA: Insufficient documentation

## 2018-04-01 DIAGNOSIS — N803 Endometriosis of pelvic peritoneum: Secondary | ICD-10-CM | POA: Insufficient documentation

## 2018-04-01 DIAGNOSIS — F329 Major depressive disorder, single episode, unspecified: Secondary | ICD-10-CM | POA: Diagnosis not present

## 2018-04-01 DIAGNOSIS — N938 Other specified abnormal uterine and vaginal bleeding: Secondary | ICD-10-CM | POA: Diagnosis present

## 2018-04-01 DIAGNOSIS — Z881 Allergy status to other antibiotic agents status: Secondary | ICD-10-CM | POA: Diagnosis not present

## 2018-04-01 DIAGNOSIS — Z880 Allergy status to penicillin: Secondary | ICD-10-CM | POA: Insufficient documentation

## 2018-04-01 DIAGNOSIS — Z882 Allergy status to sulfonamides status: Secondary | ICD-10-CM | POA: Insufficient documentation

## 2018-04-01 DIAGNOSIS — Z30432 Encounter for removal of intrauterine contraceptive device: Secondary | ICD-10-CM | POA: Diagnosis not present

## 2018-04-01 DIAGNOSIS — N800 Endometriosis of uterus: Secondary | ICD-10-CM | POA: Insufficient documentation

## 2018-04-01 DIAGNOSIS — N808 Other endometriosis: Secondary | ICD-10-CM | POA: Diagnosis not present

## 2018-04-01 HISTORY — PX: IUD REMOVAL: SHX5392

## 2018-04-01 HISTORY — PX: CYSTOSCOPY: SHX5120

## 2018-04-01 HISTORY — PX: TOTAL LAPAROSCOPIC HYSTERECTOMY WITH SALPINGECTOMY: SHX6742

## 2018-04-01 LAB — HEMOGLOBIN: HEMOGLOBIN: 13.5 g/dL (ref 12.0–15.0)

## 2018-04-01 LAB — PREGNANCY, URINE: Preg Test, Ur: NEGATIVE

## 2018-04-01 SURGERY — HYSTERECTOMY, TOTAL, LAPAROSCOPIC, WITH SALPINGECTOMY
Anesthesia: General | Site: Vagina

## 2018-04-01 MED ORDER — SODIUM CHLORIDE 0.9 % IJ SOLN
INTRAMUSCULAR | Status: DC | PRN
  Administered 2018-04-01: 10 mL

## 2018-04-01 MED ORDER — DEXAMETHASONE SODIUM PHOSPHATE 10 MG/ML IJ SOLN
INTRAMUSCULAR | Status: AC
  Filled 2018-04-01: qty 1

## 2018-04-01 MED ORDER — MENTHOL 3 MG MT LOZG: 1.0000 | LOZENGE | OROMUCOSAL | Status: DC | PRN

## 2018-04-01 MED ORDER — ROCURONIUM BROMIDE 100 MG/10ML IV SOLN
INTRAVENOUS | Status: DC | PRN
  Administered 2018-04-01: 50 mg via INTRAVENOUS

## 2018-04-01 MED ORDER — ACETAMINOPHEN 325 MG PO TABS: 650.0000 mg | ORAL_TABLET | ORAL | Status: DC | PRN

## 2018-04-01 MED ORDER — SIMETHICONE 80 MG PO CHEW
80.0000 mg | CHEWABLE_TABLET | Freq: Four times a day (QID) | ORAL | Status: DC | PRN
  Administered 2018-04-01: 80 mg via ORAL
  Filled 2018-04-01: qty 1

## 2018-04-01 MED ORDER — ENOXAPARIN SODIUM 40 MG/0.4ML ~~LOC~~ SOLN
SUBCUTANEOUS | Status: AC
  Administered 2018-04-01: 40 mg via SUBCUTANEOUS
  Filled 2018-04-01: qty 0.4

## 2018-04-01 MED ORDER — LIDOCAINE HCL (CARDIAC) PF 100 MG/5ML IV SOSY
PREFILLED_SYRINGE | INTRAVENOUS | Status: DC | PRN
  Administered 2018-04-01: 60 mg via INTRAVENOUS

## 2018-04-01 MED ORDER — ACETAMINOPHEN 10 MG/ML IV SOLN
1000.0000 mg | Freq: Once | INTRAVENOUS | Status: AC
  Administered 2018-04-01: 1000 mg via INTRAVENOUS

## 2018-04-01 MED ORDER — OXYCODONE HCL 5 MG PO TABS: 5.0000 mg | ORAL_TABLET | Freq: Once | ORAL | Status: DC | PRN

## 2018-04-01 MED ORDER — PROPOFOL 10 MG/ML IV BOLUS
INTRAVENOUS | Status: AC
  Filled 2018-04-01: qty 20

## 2018-04-01 MED ORDER — SCOPOLAMINE 1 MG/3DAYS TD PT72
1.0000 | MEDICATED_PATCH | Freq: Once | TRANSDERMAL | Status: DC
  Administered 2018-04-01: 1.5 mg via TRANSDERMAL

## 2018-04-01 MED ORDER — ONDANSETRON HCL 4 MG/2ML IJ SOLN: 4.0000 mg | Freq: Once | INTRAMUSCULAR | Status: DC | PRN

## 2018-04-01 MED ORDER — INFLUENZA VAC SPLIT QUAD 0.5 ML IM SUSY: 0.5000 mL | PREFILLED_SYRINGE | INTRAMUSCULAR | Status: DC

## 2018-04-01 MED ORDER — LACTATED RINGERS IV SOLN
INTRAVENOUS | Status: DC
  Administered 2018-04-01 (×2): via INTRAVENOUS

## 2018-04-01 MED ORDER — LIDOCAINE HCL (CARDIAC) PF 100 MG/5ML IV SOSY
PREFILLED_SYRINGE | INTRAVENOUS | Status: AC
  Filled 2018-04-01: qty 5

## 2018-04-01 MED ORDER — ACETAMINOPHEN 10 MG/ML IV SOLN
INTRAVENOUS | Status: AC
  Filled 2018-04-01: qty 100

## 2018-04-01 MED ORDER — MIDAZOLAM HCL 2 MG/2ML IJ SOLN
INTRAMUSCULAR | Status: DC | PRN
  Administered 2018-04-01: 2 mg via INTRAVENOUS

## 2018-04-01 MED ORDER — ALPRAZOLAM 0.5 MG PO TABS: 0.5000 mg | ORAL_TABLET | Freq: Two times a day (BID) | ORAL | Status: DC | PRN

## 2018-04-01 MED ORDER — FAMOTIDINE IN NACL 20-0.9 MG/50ML-% IV SOLN
20.0000 mg | Freq: Two times a day (BID) | INTRAVENOUS | Status: DC
  Administered 2018-04-01: 20 mg via INTRAVENOUS
  Filled 2018-04-01 (×2): qty 50

## 2018-04-01 MED ORDER — KETOROLAC TROMETHAMINE 30 MG/ML IJ SOLN
30.0000 mg | Freq: Four times a day (QID) | INTRAMUSCULAR | Status: DC
  Administered 2018-04-01 (×2): 30 mg via INTRAVENOUS
  Filled 2018-04-01 (×2): qty 1

## 2018-04-01 MED ORDER — KETOROLAC TROMETHAMINE 30 MG/ML IJ SOLN
INTRAMUSCULAR | Status: DC | PRN
  Administered 2018-04-01: 30 mg via INTRAVENOUS

## 2018-04-01 MED ORDER — DEXAMETHASONE SODIUM PHOSPHATE 10 MG/ML IJ SOLN
INTRAMUSCULAR | Status: DC | PRN
  Administered 2018-04-01: 5 mg via INTRAVENOUS

## 2018-04-01 MED ORDER — IBUPROFEN 800 MG PO TABS: 800.0000 mg | ORAL_TABLET | Freq: Three times a day (TID) | ORAL | 0 refills | Status: DC | PRN

## 2018-04-01 MED ORDER — ENOXAPARIN SODIUM 40 MG/0.4ML ~~LOC~~ SOLN
40.0000 mg | SUBCUTANEOUS | Status: AC
  Administered 2018-04-01: 40 mg via SUBCUTANEOUS

## 2018-04-01 MED ORDER — SUGAMMADEX SODIUM 200 MG/2ML IV SOLN
INTRAVENOUS | Status: AC
  Filled 2018-04-01: qty 2

## 2018-04-01 MED ORDER — ENOXAPARIN SODIUM 40 MG/0.4ML ~~LOC~~ SOLN: 40.0000 mg | SUBCUTANEOUS | Status: DC

## 2018-04-01 MED ORDER — OXYCODONE-ACETAMINOPHEN 5-325 MG PO TABS: 1.0000 | ORAL_TABLET | Freq: Four times a day (QID) | ORAL | 0 refills | Status: DC | PRN

## 2018-04-01 MED ORDER — MEPERIDINE HCL 25 MG/ML IJ SOLN: 6.2500 mg | INTRAMUSCULAR | Status: DC | PRN

## 2018-04-01 MED ORDER — ONDANSETRON HCL 4 MG/2ML IJ SOLN
INTRAMUSCULAR | Status: DC | PRN
  Administered 2018-04-01: 4 mg via INTRAVENOUS

## 2018-04-01 MED ORDER — KETOROLAC TROMETHAMINE 30 MG/ML IJ SOLN: 30.0000 mg | Freq: Four times a day (QID) | INTRAMUSCULAR | Status: DC

## 2018-04-01 MED ORDER — SODIUM CHLORIDE 0.9 % IV SOLN
INTRAVENOUS | Status: DC | PRN
  Administered 2018-04-01: 60 mL

## 2018-04-01 MED ORDER — ACETAMINOPHEN 325 MG PO TABS: 325.0000 mg | ORAL_TABLET | ORAL | Status: DC | PRN

## 2018-04-01 MED ORDER — ACETAMINOPHEN 160 MG/5ML PO SOLN: 325.0000 mg | ORAL | Status: DC | PRN

## 2018-04-01 MED ORDER — METRONIDAZOLE IN NACL 5-0.79 MG/ML-% IV SOLN
500.0000 mg | INTRAVENOUS | Status: AC
  Administered 2018-04-01: 500 mg via INTRAVENOUS
  Filled 2018-04-01: qty 100

## 2018-04-01 MED ORDER — GENTAMICIN SULFATE 40 MG/ML IJ SOLN
5.0000 mg/kg | INTRAVENOUS | Status: AC
  Administered 2018-04-01: 349.6 mg via INTRAVENOUS
  Filled 2018-04-01: qty 8.75

## 2018-04-01 MED ORDER — SCOPOLAMINE 1 MG/3DAYS TD PT72
MEDICATED_PATCH | TRANSDERMAL | Status: AC
  Administered 2018-04-01: 1.5 mg via TRANSDERMAL
  Filled 2018-04-01: qty 1

## 2018-04-01 MED ORDER — MIDAZOLAM HCL 2 MG/2ML IJ SOLN
INTRAMUSCULAR | Status: AC
  Filled 2018-04-01: qty 2

## 2018-04-01 MED ORDER — OXYCODONE HCL 5 MG/5ML PO SOLN: 5.0000 mg | Freq: Once | ORAL | Status: DC | PRN

## 2018-04-01 MED ORDER — PROMETHAZINE HCL 12.5 MG PO TABS: ORAL_TABLET | ORAL | 0 refills | Status: AC

## 2018-04-01 MED ORDER — FENTANYL CITRATE (PF) 250 MCG/5ML IJ SOLN
INTRAMUSCULAR | Status: AC
  Filled 2018-04-01: qty 5

## 2018-04-01 MED ORDER — SODIUM CHLORIDE 0.9 % IJ SOLN
INTRAMUSCULAR | Status: AC
  Filled 2018-04-01: qty 50

## 2018-04-01 MED ORDER — ONDANSETRON HCL 4 MG/2ML IJ SOLN
INTRAMUSCULAR | Status: AC
  Filled 2018-04-01: qty 2

## 2018-04-01 MED ORDER — ALBUTEROL SULFATE (2.5 MG/3ML) 0.083% IN NEBU: 3.0000 mL | INHALATION_SOLUTION | RESPIRATORY_TRACT | Status: DC | PRN

## 2018-04-01 MED ORDER — SUGAMMADEX SODIUM 200 MG/2ML IV SOLN
INTRAVENOUS | Status: DC | PRN
  Administered 2018-04-01: 200 mg via INTRAVENOUS

## 2018-04-01 MED ORDER — ROCURONIUM BROMIDE 100 MG/10ML IV SOLN
INTRAVENOUS | Status: AC
  Filled 2018-04-01: qty 1

## 2018-04-01 MED ORDER — BUPIVACAINE HCL (PF) 0.25 % IJ SOLN
INTRAMUSCULAR | Status: AC
  Filled 2018-04-01: qty 30

## 2018-04-01 MED ORDER — PROPOFOL 10 MG/ML IV BOLUS
INTRAVENOUS | Status: DC | PRN
  Administered 2018-04-01: 150 mg via INTRAVENOUS

## 2018-04-01 MED ORDER — OXYCODONE-ACETAMINOPHEN 5-325 MG PO TABS
1.0000 | ORAL_TABLET | ORAL | Status: DC | PRN
  Administered 2018-04-01: 1 via ORAL
  Filled 2018-04-01: qty 1

## 2018-04-01 MED ORDER — DEXTROSE-NACL 5-0.45 % IV SOLN
INTRAVENOUS | Status: DC
  Administered 2018-04-01: 12:00:00 via INTRAVENOUS

## 2018-04-01 MED ORDER — FENTANYL CITRATE (PF) 100 MCG/2ML IJ SOLN
25.0000 ug | INTRAMUSCULAR | Status: DC | PRN
  Administered 2018-04-01: 50 ug via INTRAVENOUS

## 2018-04-01 MED ORDER — ALUM & MAG HYDROXIDE-SIMETH 200-200-20 MG/5ML PO SUSP: 30.0000 mL | ORAL | Status: DC | PRN

## 2018-04-01 MED ORDER — BUPIVACAINE HCL (PF) 0.25 % IJ SOLN
INTRAMUSCULAR | Status: DC | PRN
  Administered 2018-04-01: 8 mL

## 2018-04-01 MED ORDER — MORPHINE SULFATE (PF) 4 MG/ML IV SOLN: 1.0000 mg | INTRAVENOUS | Status: DC | PRN

## 2018-04-01 MED ORDER — ROPIVACAINE HCL 5 MG/ML IJ SOLN
INTRAMUSCULAR | Status: AC
  Filled 2018-04-01: qty 30

## 2018-04-01 MED ORDER — FENTANYL CITRATE (PF) 100 MCG/2ML IJ SOLN
INTRAMUSCULAR | Status: AC
  Filled 2018-04-01: qty 2

## 2018-04-01 MED ORDER — FENTANYL CITRATE (PF) 100 MCG/2ML IJ SOLN
INTRAMUSCULAR | Status: DC | PRN
  Administered 2018-04-01: 50 ug via INTRAVENOUS
  Administered 2018-04-01 (×2): 100 ug via INTRAVENOUS

## 2018-04-01 SURGICAL SUPPLY — 48 items
ADH SKN CLS APL DERMABOND .7 (GAUZE/BANDAGES/DRESSINGS) ×3
APL SRG 38 LTWT LNG FL B (MISCELLANEOUS)
APPLICATOR ARISTA FLEXITIP XL (MISCELLANEOUS) IMPLANT
CABLE HIGH FREQUENCY MONO STRZ (ELECTRODE) ×4 IMPLANT
COVER LIGHT HANDLE  1/PK (MISCELLANEOUS) ×1
COVER LIGHT HANDLE 1/PK (MISCELLANEOUS) ×3 IMPLANT
COVER MAYO STAND STRL (DRAPES) ×4 IMPLANT
DERMABOND ADVANCED (GAUZE/BANDAGES/DRESSINGS) ×1
DERMABOND ADVANCED .7 DNX12 (GAUZE/BANDAGES/DRESSINGS) ×3 IMPLANT
DURAPREP 26ML APPLICATOR (WOUND CARE) ×4 IMPLANT
GLOVE BIOGEL PI IND STRL 7.0 (GLOVE) ×12 IMPLANT
GLOVE BIOGEL PI INDICATOR 7.0 (GLOVE) ×4
GLOVE ECLIPSE 6.5 STRL STRAW (GLOVE) ×8 IMPLANT
GOWN STRL REUS W/TWL LRG LVL3 (GOWN DISPOSABLE) ×16 IMPLANT
HEMOSTAT ARISTA ABSORB 3G PWDR (MISCELLANEOUS) IMPLANT
LIGASURE VESSEL 5MM BLUNT TIP (ELECTROSURGICAL) ×4 IMPLANT
NEEDLE INSUFFLATION 120MM (ENDOMECHANICALS) ×4 IMPLANT
NS IRRIG 1000ML POUR BTL (IV SOLUTION) ×4 IMPLANT
OCCLUDER COLPOPNEUMO (BALLOONS) ×4 IMPLANT
PACK LAPAROSCOPY BASIN (CUSTOM PROCEDURE TRAY) ×4 IMPLANT
PACK TRENDGUARD 450 HYBRID PRO (MISCELLANEOUS) IMPLANT
POUCH LAPAROSCOPIC INSTRUMENT (MISCELLANEOUS) ×4 IMPLANT
PROTECTOR NERVE ULNAR (MISCELLANEOUS) ×8 IMPLANT
SCISSORS LAP 5X35 DISP (ENDOMECHANICALS) ×4 IMPLANT
SET CYSTO W/LG BORE CLAMP LF (SET/KITS/TRAYS/PACK) ×4 IMPLANT
SET IRRIG TUBING LAPAROSCOPIC (IRRIGATION / IRRIGATOR) ×4 IMPLANT
SET TRI-LUMEN FLTR TB AIRSEAL (TUBING) ×4 IMPLANT
SHEARS HARMONIC ACE PLUS 36CM (ENDOMECHANICALS) ×4 IMPLANT
SUT VIC AB 0 CT1 27 (SUTURE) ×8
SUT VIC AB 0 CT1 27XBRD ANBCTR (SUTURE) ×6 IMPLANT
SUT VICRYL 4-0 PS2 18IN ABS (SUTURE) ×8 IMPLANT
SUT VLOC 180 0 9IN  GS21 (SUTURE) ×1
SUT VLOC 180 0 9IN GS21 (SUTURE) ×3 IMPLANT
SYR 10ML LL (SYRINGE) ×4 IMPLANT
SYR 50ML LL SCALE MARK (SYRINGE) ×8 IMPLANT
TIP UTERINE 5.1X6CM LAV DISP (MISCELLANEOUS) IMPLANT
TIP UTERINE 6.7X10CM GRN DISP (MISCELLANEOUS) IMPLANT
TIP UTERINE 6.7X6CM WHT DISP (MISCELLANEOUS) IMPLANT
TIP UTERINE 6.7X8CM BLUE DISP (MISCELLANEOUS) ×2 IMPLANT
TOWEL OR 17X24 6PK STRL BLUE (TOWEL DISPOSABLE) ×8 IMPLANT
TRAY FOLEY W/BAG SLVR 14FR (SET/KITS/TRAYS/PACK) ×4 IMPLANT
TRENDGUARD 450 HYBRID PRO PACK (MISCELLANEOUS) ×4
TROCAR ADV FIXATION 5X100MM (TROCAR) ×4 IMPLANT
TROCAR PORT AIRSEAL 5X120 (TROCAR) ×4 IMPLANT
TROCAR XCEL NON BLADE 8MM B8LT (ENDOMECHANICALS) ×4 IMPLANT
TROCAR XCEL NON-BLD 5MMX100MML (ENDOMECHANICALS) ×4 IMPLANT
TUBING INSUF HEATED (TUBING) ×1 IMPLANT
WARMER LAPAROSCOPE (MISCELLANEOUS) ×4 IMPLANT

## 2018-04-01 NOTE — Discharge Instructions (Signed)

## 2018-04-01 NOTE — H&P (Signed)
Margaret Ford is an 42 y.o. female G2P1 DWF here for definitive treatment of irregular bleeding.  She's had an IUD for several years.  This most recent one has been changed without improvement in bleeding.  She does have a hx of endometriosis.  She does have a family hx of ovarian cancer in her mother at age 62.  She has decided at this time not to have her ovaries removed.  Ultrasound showed uterus measuring 8 x 4.5 x 4.2cm.  Endometrium is 4.62mm.  Ovaries appeared normal except for left corpus luteum.    Risks, benefits and alternatives have been discussed.  She is ready to not have any further bleeding.  Questions answered.  She is ready to proceed.  Pertinent Gynecological History: Menses: irregular bleeding Bleeding: dysfunctional uterine bleeding Contraception: tubal ligation DES exposure: denies Blood transfusions: none Sexually transmitted diseases: no past history Previous GYN Procedures: laparoscopy with BTL 10/10  Last mammogram: normal Date: 10/18 Last pap: abnormal: LGSIL Date: 12/06/17.  colpo with CIN1 OB History: G2, P1   Menstrual History: No LMP recorded. (Menstrual status: IUD).    Past Medical History:  Diagnosis Date  . Abnormal Pap smear    history of colposcopy ECC with atypical squamous metaplasia  . Anxiety   . Asthma   . Bilateral hearing loss    hearing aid in right ear and cochlear implant in left ear  . Bursitis of shoulder, right   . Congenital hearing loss    started age 86 -   . Depression    no meds  . Endometriosis 10/10  . Headache    otc med prn  . Hx of migraines    increased frequency with estrogen containing OCPs  . IUD (intrauterine device) in place 08/2017   Mirena  . Panic attacks   . Recurrent upper respiratory infection (URI)   . Seasonal allergies   . SVD (spontaneous vaginal delivery)    x 1    Past Surgical History:  Procedure Laterality Date  . BREAST REDUCTION SURGERY  9/10  . COCHLEAR IMPLANT Left 2014   moval  of Envoy device  . IMPLANTATION OF ENVOY ESTEEM HEARING DEVICE     for hearing loss - now has been removed and replaced with cochlear implant 2014  . TUBAL LIGATION  10/10   cautery of endometriosis  . WISDOM TOOTH EXTRACTION      Family History  Problem Relation Age of Onset  . Deafness Mother   . Diabetes Mother   . Heart Problems Mother        tachycardia  . Ovarian cancer Mother 34  . Deafness Father   . Hypertension Father   . Cirrhosis Father        non alcoholic  . COPD Father   . Hyperlipidemia Father   . Down syndrome Sister   . Diabetes Paternal Grandmother   . Allergic rhinitis Neg Hx   . Angioedema Neg Hx   . Asthma Neg Hx   . Eczema Neg Hx   . Immunodeficiency Neg Hx   . Urticaria Neg Hx     Social History:  reports that she has never smoked. She has never used smokeless tobacco. She reports that she drinks about 4.0 standard drinks of alcohol per week. She reports that she does not use drugs.  Allergies:  Allergies  Allergen Reactions  . Augmentin [Amoxicillin-Pot Clavulanate] Diarrhea  . Ciprofloxacin Diarrhea  . Codeine Other (See Comments)    "sick" - ok  to take with nausea med  . Shrimp [Shellfish Allergy]   . Sulfa Antibiotics Other (See Comments)    Generalized burning  . Z-Pak [Azithromycin] Other (See Comments)    thrush    Medications Prior to Admission  Medication Sig Dispense Refill Last Dose  . albuterol (VENTOLIN HFA) 108 (90 Base) MCG/ACT inhaler Inhale 2 puffs into the lungs every 4 (four) hours as needed for wheezing or shortness of breath. 1 Inhaler 1 Past Week at Unknown time  . ALPRAZolam (XANAX) 0.5 MG tablet Take 1 tablet (0.5 mg total) by mouth 2 (two) times daily as needed for anxiety. 30 tablet 0 03/31/2018 at Unknown time  . azelastine (ASTELIN) 0.1 % nasal spray SPRAY 2 SPRAYS INTO EACH NOSTRIL TWICE DAILY (Patient taking differently: Place 2 sprays into both nostrils 2 (two) times daily as needed for rhinitis. ) 30 mL 2  04/01/2018 at 0430  . Calcium Carbonate (CALCIUM 600 PO) Take 1 tablet by mouth daily.   Past Month at Unknown time  . Cholecalciferol (VITAMIN D) 2000 units tablet Take 2,000 Units by mouth daily.    Past Month at Unknown time  . diclofenac sodium (VOLTAREN) 1 % GEL Apply 1 application topically 3 (three) times daily as needed (shoulder pain).   Past Month at Unknown time  . ELDERBERRY PO Take 1 tablet by mouth daily as needed (for cold).    Past Month at Unknown time  . fexofenadine-pseudoephedrine (ALLEGRA-D 24) 180-240 MG 24 hr tablet Take 1 tablet by mouth daily.   03/31/2018 at Unknown time  . fluticasone (FLOVENT HFA) 110 MCG/ACT inhaler Inhale 1 puff into the lungs 2 (two) times daily. 12 g 5 04/01/2018 at 0430  . Fluticasone Propionate (XHANCE) 93 MCG/ACT EXHU Place 2 puffs into both nostrils 2 (two) times daily. 16 mL 12 04/01/2018 at 0430  . folic acid (FOLVITE) 469 MCG tablet Take 400 mcg by mouth daily.   Past Month at Unknown time  . Multiple Vitamins-Minerals (MULTIVITAMIN GUMMIES ADULT PO) Take 1 each by mouth daily. Super food   Past Month at Unknown time  . Probiotic Product (PROBIOTIC-10) CHEW Chew 1 each by mouth daily.    Past Month at Unknown time  . levonorgestrel (MIRENA) 20 MCG/24HR IUD 1 each by Intrauterine route once.       Review of Systems  All other systems reviewed and are negative.   Blood pressure 113/65, pulse 74, temperature 98 F (36.7 C), temperature source Oral, resp. rate 16, SpO2 100 %. Physical Exam  Constitutional: She is oriented to person, place, and time. She appears well-developed and well-nourished.  Cardiovascular: Normal rate and regular rhythm.  Respiratory: Effort normal and breath sounds normal.  Neurological: She is alert and oriented to person, place, and time.  Skin: Skin is warm and dry.  Psychiatric: She has a normal mood and affect.    Results for orders placed or performed during the hospital encounter of 04/01/18 (from the past 24  hour(s))  Pregnancy, urine     Status: None   Collection Time: 04/01/18  6:00 AM  Result Value Ref Range   Preg Test, Ur NEGATIVE NEGATIVE    No results found.  Assessment/Plan: 42 yo G2P1 DWF with DUB and failed IUD who is ready to proceed with definitive treatment with TLH/bilateral salpingectomy/possible BSO, cystoscopy  Megan Salon 04/01/2018, 7:08 AM

## 2018-04-01 NOTE — Op Note (Signed)
04/01/2018  9:47 AM  PATIENT:  Ferne Reus  42 y.o. female  PRE-OPERATIVE DIAGNOSIS:  irregular uterine bleeding, hx endometriosis  POST-OPERATIVE DIAGNOSIS:  irregular uterine bleeding, endometriosis  PROCEDURE:  Procedure(s): TOTAL LAPAROSCOPIC HYSTERECTOMY WITH SALPINGECTOMY WITH EXCISION OF ENDOMETRIOSIS CYSTOSCOPY INTRAUTERINE DEVICE (IUD) REMOVAL  SURGEON:  Megan Salon  ASSISTANTS: Sumner Boast, MD   ANESTHESIA:   general  ESTIMATED BLOOD LOSS: 25 mL  BLOOD ADMINISTERED:none   FLUIDS: 1300CC LR  UOP: 100cc CLear UOP  SPECIMEN:  Uterus, cervix, bilateral fallopian tubes.  Filshie clips were removed at the same time as well but not sent to pathology.  Peritoneal specimens where endometriosis was resected.  DISPOSITION OF SPECIMEN:  PATHOLOGY  FINDINGS: endometriosis beneath the cervix, between the uterosacral ligaments  DESCRIPTION OF OPERATION: Patient is taken to the operating room. She is placed in the supine position. She is a running IV in place. Informed consent was present on the chart. SCDs on her lower extremities and functioning properly. Patient was positioned while she was awake.  Her legs were placed in the low lithotomy position in [place]. Her arms were tucked by the side.  General endotracheal anesthesia was administered by the anesthesia staff without difficulty. Dr. Rayetta Pigg, anesthesia, oversaw case.  Time out performed.    Chlora prep was then used to prep the abdomen and technicare was used to prep the inner thighs, perineum and vagina. Once 3 minutes had past the patient was draped in a normal standard fashion. The legs were lifted to the high lithotomy position. The cervix was visualized by placing a heavy weighted speculum in the posterior aspect of the vagina and using a curved Deaver retractor to the retract anteriorly. The anterior lip of the cervix was grasped with single-tooth tenaculum.  IUD was removed with one pull on the IUD  strings.  This was discarded.  The cervix sounded to 8 cm. Pratt dilators were used to dilate the cervix up to a #21. A RUMI uterine manipulator was obtained. A #8 disposable tip was placed on the RUMI manipulator as well as a 3.5, silver KOH ring. This was passed through the cervix and the bulb of the disposable tip was inflated with 10 cc of normal saline. There was a good fit of the KOH ring around the cervix. The tenaculum was removed. There is also good manipulation of the uterus. The speculum and retractor were removed as well. A Foley catheter was placed to straight drain.  Clear urine was noted. Legs were lowered to the low lithotomy position and attention was turned the abdomen.  The umbilicus was everted.  A Veress needle was obtained. Syringe of sterile saline was placed on a open Veress needle.  This was passed into the umbilicus until just when the fluid started to drip.  Then low flow CO2 gas was attached the needle and the pneumoperitoneum was achieved without difficulty. Once 3 liters of gas was in the abdomen the Veress needle was removed and a 5 millimeter non-bladed Optiview trocar and port were passed directly to the abdomen. The laparoscope was then used to confirm intraperitoneal placement. A large uterus with fundal fibroid was noted.  There was some endometriosis appearing tissue on the back of the cervix.  Ovaries were normal.  Are where prior tubal reanastomosis had been performed was seen and looks really nice, without any adhesions.  Locations for RLQ, LLQ, and suprapubic ports were noted by transillumination of the abdominal wall.  0.25% marcaine was used  to anesthetize the skin.  77mm skin incision was made in the LLQ and an AirSeal port was placed underdirect visualization of the laparoscope.  Then a 18mm skin incision was made and a 50mm nonbladed trochar and port was placed in the R:Q.  Finally, and 2mm skin incision was made about 4cm above the pubic symphasis and an 48mm non-bladed  port was placed with direct visualization of the laparoscope.  All trochars were removed.    Ureters were identifies.  Attention was turned to the left side. With uterus on stretch the left tube was excised off the ovary and mesosalpinx was dissected to free the tube. This was removed with the Filshie clip attached.  Then the left utero-ovarian pedicle was serially clamped cauterized and incised using the ligasure device. Left round ligament was serially clamped cauterized and incised. The anterior and posterior peritoneum of the inferior leaf of the broad ligament were opened. The beginning of the baldder flap was created.  The bladder was taken down below the level of the KOH ring. The left uterine artery skeletonized and then just superior to the KOH ring this vessel was serially clamped, cauterized, and incised.  Attention was turned the right side.  The uterus was placed on stretch to the opposite side.  The tube was excised off the ovary using sharp dissection a bipolar cautery.  The mesosalpinx was incised freeing the tube.  This specimen was brought out of the abdomen with the Filshie clip attached as well.  The filshie clips were removed from the specimen and not sent to pathology.  Then the right uterine ovarian pedicle was serially clamped cauterized and incised. Next the right round ligament was serially clamped cauterized and incised. The anterior posterior peritoneum of the inferiorly for the broad ligament were opened. The anterior peritoneum was carried across to the dissection on the left side. The remainder of the bladder flap was created using sharp dissection. The bladder was well below the level of the KOH ring. The left uterine artery skeletonized. Then the left uterine artery, above the level of the KOH ring, was serially clamped cauterized and incised. The uterus was devascularized at this point.  The colpotomy was performed a starting in the midline and using a harmonic scalpel with  the inferior edge of the open blade  This was carried around a circumferential fashion until the vaginal mucosa was completely incised in the specimen was freed.  The specimen was then delivered to the vagina.  A vaginal occlusive device was used to maintain the pneumoperitoneum  Instruments were changed with a needle driver and Kobra graspers.  Using a 9 inch V. lock suture, the cuff was closed by incorporating the anterior and posterior vaginal mucosa in each stitch. This was carried across all the way to the left corner and a running fashion. Two stitches were brought back towards the midline and the suture was cut flush with the vagina. The needle was brought out the pelvis. The pelvis was irrigated. All pedicles were inspected. No bleeding was noted. CO2 pressures were decreased to ensure there was no delayed bleeding.  Pressure was decreased to 17mmHg.  Then Arista was placed along all cut surfaces and at all pedicles.  At this point the procedure was completed. The suprapubic port was removed.  The remaining instruments were removed.  The patient was taken out of Trendelenburg positioning.  Several deep breaths were given to the patient's trying to any gas the abdomen and finally the RLQ and  LLQ ports were removed.  Finally the umbilical port and laparoscope were removed.    The skin was then closed with subcuticular stitches of 3-0 Vicryl. The skin was cleansed Dermabond was applied. Attention was then turned the vagina and the cuff was inspected. No bleeding was noted. The anterior posterior vaginal mucosa was incorporated in each stitch. The Foley catheter was removed.  Cystoscopy was performed.  No sutures or bladder injuries were noted.  Ureters were noted with normal urine jets from each one was seen.  Normal bubble was on the dome of the bladder.  Foley was left out after the cystoscopic fluid was drained and cystoscope removed.  Sponge, lap, needle, initially counts were correct x2. Patient  tolerated the procedure very well. She was awakened from anesthesia, extubated and taken to recovery in stable condition.   COUNTS:  YES  PLAN OF CARE: Transfer to PACU

## 2018-04-01 NOTE — Anesthesia Procedure Notes (Signed)
Procedure Name: Intubation Date/Time: 04/01/2018 7:36 AM Performed by: Jonna Munro, CRNA Pre-anesthesia Checklist: Patient identified, Emergency Drugs available, Suction available, Patient being monitored and Timeout performed Patient Re-evaluated:Patient Re-evaluated prior to induction Oxygen Delivery Method: Circle system utilized Preoxygenation: Pre-oxygenation with 100% oxygen Induction Type: IV induction Ventilation: Mask ventilation without difficulty Laryngoscope Size: Mac and 3 Grade View: Grade I Tube type: Oral Tube size: 7.0 mm Number of attempts: 1 Airway Equipment and Method: Stylet Placement Confirmation: ETT inserted through vocal cords under direct vision,  positive ETCO2 and breath sounds checked- equal and bilateral Secured at: 21 cm Tube secured with: Tape Dental Injury: Teeth and Oropharynx as per pre-operative assessment

## 2018-04-01 NOTE — Anesthesia Postprocedure Evaluation (Signed)
Anesthesia Post Note  Patient: Margaret Ford  Procedure(s) Performed: TOTAL LAPAROSCOPIC HYSTERECTOMY WITH SALPINGECTOMY WITH EXCISION OF ENDOMETRIOSIS (Bilateral Abdomen) CYSTOSCOPY (N/A Bladder) INTRAUTERINE DEVICE (IUD) REMOVAL  possible (N/A Vagina )     Patient location during evaluation: PACU Anesthesia Type: General Level of consciousness: awake and alert Pain management: pain level controlled Vital Signs Assessment: post-procedure vital signs reviewed and stable Respiratory status: spontaneous breathing, nonlabored ventilation, respiratory function stable and patient connected to nasal cannula oxygen Cardiovascular status: blood pressure returned to baseline and stable Postop Assessment: no apparent nausea or vomiting Anesthetic complications: no    Last Vitals:  Vitals:   04/01/18 1045 04/01/18 1105  BP: 101/60 94/61  Pulse: 62 (!) 55  Resp: 11   Temp:  (!) 36.3 C  SpO2: 99% 100%    Last Pain:  Vitals:   04/01/18 1105  TempSrc: Oral  PainSc:    Pain Goal: Patients Stated Pain Goal: 4 (04/01/18 0622)               Gaylin Osoria

## 2018-04-01 NOTE — Discharge Summary (Signed)
Physician Discharge Summary  Patient ID: Margaret Ford MRN: [id number] DOB/AGE: [birth year] 42 y.o.  Admit date: 04/01/2018 Discharge date: 04/01/2018  Admission Diagnoses:  DUB, failed Mirena IUD use, h/o endometriosis  Discharge Diagnoses:  Active Problems:   * No active hospital problems. *   Discharged Condition: good  Hospital Course: Patient admitted through same day surgery.  She was taken to OR where TLH/bilateral salpingectomy/cystoscopy, resection of endometriosis were performed.  Surgical findings included normal ovaries and tubes, h/o prior tubal ligation with Filshie clips, endometriosis beneath cervix posteriorly and between uterosacral ligaments.  Surgery was uneventful.  EBL 25cc.  Foley catheter was removed before leaving OR.  Patient transferred to PACU where she was stable and then to 3rd floor for the remainder of her hospitalization.  During her post-op recovery, her vitals and stable and she was AF.  In evening of POD#0, she was able to transition to oral pain medications and regular diet.  She was able to ambulate and she had good pain control.  She was also able to void on her own.  She walked.  She had no nausea.  Patient seen both in the evening of POD#0 was meeting all criteria for discharge.  She did desires going home.  Post op hb was 13.5, decreased from 13.5, pre-operatively.  I did feel discharge was appropriate.  Consults: None  Significant Diagnostic Studies: labs: post op hb 13.5  Treatments: surgery: TLH/bilateral salpingectomy/cystoscopy  Discharge Exam: Blood pressure 107/65, pulse 60, temperature 98.7 F (37.1 C), temperature source Oral, resp. rate 18, height 5' 5.5" (1.664 m), weight 69.9 kg, SpO2 100 %. General appearance: alert, cooperative and no distress Resp: clear to auscultation bilaterally Cardio: regular rate and rhythm, S1, S2 normal, no murmur, click, rub or gallop GI: soft, non-tender; bowel sounds normal; no masses,  no  organomegaly Incision/Wound: C/D/I  Vaginal bleeding:  noned  Disposition:    Allergies as of 04/01/2018      Reactions   Augmentin [amoxicillin-pot Clavulanate] Diarrhea   Ciprofloxacin Diarrhea   Codeine Other (See Comments)   "sick" - ok to take with nausea med   Shrimp [shellfish Allergy]    Sulfa Antibiotics Other (See Comments)   Generalized burning   Z-pak [azithromycin] Other (See Comments)   thrush      Medication List    TAKE these medications   albuterol 108 (90 Base) MCG/ACT inhaler Commonly known as:  PROVENTIL HFA;VENTOLIN HFA Inhale 2 puffs into the lungs every 4 (four) hours as needed for wheezing or shortness of breath.   ALPRAZolam 0.5 MG tablet Commonly known as:  XANAX Take 1 tablet (0.5 mg total) by mouth 2 (two) times daily as needed for anxiety.   azelastine 0.1 % nasal spray Commonly known as:  ASTELIN SPRAY 2 SPRAYS INTO EACH NOSTRIL TWICE DAILY What changed:  See the new instructions.   CALCIUM 600 PO Take 1 tablet by mouth daily.   diclofenac sodium 1 % Gel Commonly known as:  VOLTAREN Apply 1 application topically 3 (three) times daily as needed (shoulder pain).   ELDERBERRY PO Take 1 tablet by mouth daily as needed (for cold).   fexofenadine-pseudoephedrine 180-240 MG 24 hr tablet Commonly known as:  ALLEGRA-D 24 Take 1 tablet by mouth daily.   fluticasone 110 MCG/ACT inhaler Commonly known as:  FLOVENT HFA Inhale 1 puff into the lungs 2 (two) times daily.   Fluticasone Propionate 93 MCG/ACT Exhu Place 2 puffs into both nostrils 2 (two) times daily.  folic acid 316 MCG tablet Commonly known as:  FOLVITE Take 400 mcg by mouth daily.   ibuprofen 800 MG tablet Commonly known as:  ADVIL,MOTRIN Take 1 tablet (800 mg total) by mouth every 8 (eight) hours as needed.   MULTIVITAMIN GUMMIES ADULT PO Take 1 each by mouth daily. Super food   oxyCODONE-acetaminophen 5-325 MG tablet Commonly known as:  PERCOCET/ROXICET Take 1-2  tablets by mouth every 6 (six) hours as needed for moderate pain or severe pain.   PROBIOTIC-10 Chew Chew 1 each by mouth daily.   promethazine 12.5 MG tablet Commonly known as:  PHENERGAN 1-2 tablets every six hours as needed for nausea   Vitamin D 2000 units tablet Take 2,000 Units by mouth daily.        Signed: Megan Salon 04/01/2018, 7:02 PM

## 2018-04-01 NOTE — Transfer of Care (Signed)
Immediate Anesthesia Transfer of Care Note  Patient: Margaret Ford  Procedure(s) Performed: TOTAL LAPAROSCOPIC HYSTERECTOMY WITH SALPINGECTOMY WITH EXCISION OF ENDOMETRIOSIS (Bilateral Abdomen) CYSTOSCOPY (N/A Bladder) INTRAUTERINE DEVICE (IUD) REMOVAL  possible (N/A Vagina )  Patient Location: PACU  Anesthesia Type:General  Level of Consciousness: awake, alert  and oriented  Airway & Oxygen Therapy: Patient Spontanous Breathing and Patient connected to nasal cannula oxygen  Post-op Assessment: Report given to RN and Post -op Vital signs reviewed and stable  Post vital signs: Reviewed and stable  Last Vitals:  Vitals Value Taken Time  BP 97/50 04/01/2018  9:43 AM  Temp    Pulse 66 04/01/2018  9:45 AM  Resp 15 04/01/2018  9:45 AM  SpO2 100 % 04/01/2018  9:45 AM  Vitals shown include unvalidated device data.  Last Pain:  Vitals:   04/01/18 0622  TempSrc: Oral  PainSc: 0-No pain      Patients Stated Pain Goal: 4 (45/99/77 [id number])  Complications: No apparent anesthesia complications

## 2018-04-01 NOTE — Progress Notes (Signed)
Day of Surgery Procedure(s) (LRB): TOTAL LAPAROSCOPIC HYSTERECTOMY WITH SALPINGECTOMY WITH EXCISION OF ENDOMETRIOSIS (Bilateral) CYSTOSCOPY (N/A) INTRAUTERINE DEVICE (IUD) REMOVAL  possible (N/A)  Subjective: Patient reports no complaints.  Has good pain control and no nausea.  Has walked, voided and eaten regular diet.  Has received Percocet once.  Would like to go home.  Objective: I have reviewed patient's vital signs, intake and output, medications and labs. Post op hb:  13.5  General: alert and cooperative Resp: clear to auscultation bilaterally Cardio: regular rate and rhythm, S1, S2 normal, no murmur, click, rub or gallop GI: soft, non-tender; bowel sounds normal; no masses,  no organomegaly and incision: clean, dry and intact Extremities: extremities normal, atraumatic, no cyanosis or edema Vaginal Bleeding: none  Assessment: s/p Procedure(s) with comments: TOTAL LAPAROSCOPIC HYSTERECTOMY WITH SALPINGECTOMY WITH EXCISION OF ENDOMETRIOSIS (Bilateral) CYSTOSCOPY (N/A) INTRAUTERINE DEVICE (IUD) REMOVAL  possible (N/A) - possible: stable and progressing well  Plan: Discharge home  LOS: 0 days    Megan Salon 04/01/2018, 6:52 PM

## 2018-04-02 ENCOUNTER — Encounter (HOSPITAL_COMMUNITY): Payer: Self-pay | Admitting: Obstetrics & Gynecology

## 2018-04-08 ENCOUNTER — Telehealth: Payer: Self-pay | Admitting: Obstetrics & Gynecology

## 2018-04-08 ENCOUNTER — Encounter: Payer: Self-pay | Admitting: Obstetrics & Gynecology

## 2018-04-08 ENCOUNTER — Ambulatory Visit (INDEPENDENT_AMBULATORY_CARE_PROVIDER_SITE_OTHER): Payer: 59 | Admitting: Obstetrics & Gynecology

## 2018-04-08 ENCOUNTER — Ambulatory Visit (INDEPENDENT_AMBULATORY_CARE_PROVIDER_SITE_OTHER): Payer: 59 | Admitting: *Deleted

## 2018-04-08 VITALS — BP 96/60 | HR 86 | Resp 18 | Ht 65.5 in | Wt 153.2 lb

## 2018-04-08 DIAGNOSIS — N3289 Other specified disorders of bladder: Secondary | ICD-10-CM

## 2018-04-08 DIAGNOSIS — J309 Allergic rhinitis, unspecified: Secondary | ICD-10-CM | POA: Diagnosis not present

## 2018-04-08 DIAGNOSIS — Z9889 Other specified postprocedural states: Secondary | ICD-10-CM

## 2018-04-08 NOTE — Telephone Encounter (Signed)
Dr. Sabra Heck -patient s/p [facility] 04/01/18. OK to proceed with flu vaccine with PCP or allergist?

## 2018-04-08 NOTE — Progress Notes (Signed)
Post Operative Visit  Procedure: Total Laparoscopic Hysterectomy with Salpingectomy with Excision of endometriosis, Cystoscopy, IUD removal Days Post-op: 7  Subjective: Doing well.  Having fatigue.  Stopped pain medications on Saturday.  Using Tylenol and Advil now.  Having bowel movements.  Does have some pain and pressure when her bladder is full.  Does not have dysuria.  Denies vaginal bleeding.  Denies fever.   Objective: BP 96/60 (BP Location: Right Arm, Patient Position: Sitting, Cuff Size: Normal)   Pulse 86   Resp 18   Ht 5' 5.5" (1.664 m)   Wt 153 lb 3.2 oz (69.5 kg)   LMP  (LMP Unknown) Comment: Mirena IUD - Inserted 08/2017  BMI 25.11 kg/m   EXAM General: alert and no distress Resp: clear to auscultation bilaterally Cardio: regular rate and rhythm, S1, S2 normal, no murmur, click, rub or gallop GI: soft, non-tender; bowel sounds normal; no masses,  no organomegaly and incision: clean, dry and intact Extremities: extremities normal, atraumatic, no cyanosis or edema Vaginal Bleeding: none  Assessment: s/p TLH/bilateral salpingectomy/cystoscopy Post op bladder spasms  Plan: Urine culture pending Recheck 3 weeks

## 2018-04-08 NOTE — Telephone Encounter (Signed)
Totally forgot to ask. When would it be safe for me to take flu shot? Asking since I go to the allergist weekly for immunotherapy.  Maybe they can give it to me?     Thanks!

## 2018-04-08 NOTE — Telephone Encounter (Signed)
Yes.  It is ok to proceed.  Thank you.

## 2018-04-09 DIAGNOSIS — D1801 Hemangioma of skin and subcutaneous tissue: Secondary | ICD-10-CM | POA: Diagnosis not present

## 2018-04-09 DIAGNOSIS — D225 Melanocytic nevi of trunk: Secondary | ICD-10-CM | POA: Diagnosis not present

## 2018-04-09 DIAGNOSIS — L814 Other melanin hyperpigmentation: Secondary | ICD-10-CM | POA: Diagnosis not present

## 2018-04-09 NOTE — Telephone Encounter (Signed)
Spoke with patient, advised as seen below per Dr. Miller. Patient verbalizes understanding and is agreeable.   Encounter closed.  

## 2018-04-10 ENCOUNTER — Telehealth: Payer: Self-pay | Admitting: *Deleted

## 2018-04-10 ENCOUNTER — Encounter: Payer: Self-pay | Admitting: Obstetrics & Gynecology

## 2018-04-10 ENCOUNTER — Other Ambulatory Visit: Payer: Self-pay

## 2018-04-10 ENCOUNTER — Ambulatory Visit (INDEPENDENT_AMBULATORY_CARE_PROVIDER_SITE_OTHER): Payer: 59 | Admitting: Obstetrics & Gynecology

## 2018-04-10 VITALS — BP 110/70 | HR 92 | Temp 97.7°F | Resp 16 | Ht 65.5 in | Wt 153.6 lb

## 2018-04-10 DIAGNOSIS — Z9889 Other specified postprocedural states: Secondary | ICD-10-CM

## 2018-04-10 DIAGNOSIS — R3915 Urgency of urination: Secondary | ICD-10-CM

## 2018-04-10 LAB — POCT URINALYSIS DIPSTICK
Bilirubin, UA: NEGATIVE
GLUCOSE UA: NEGATIVE
Ketones, UA: NEGATIVE
Leukocytes, UA: NEGATIVE
Nitrite, UA: NEGATIVE
Protein, UA: NEGATIVE
RBC UA: NEGATIVE
Urobilinogen, UA: 0.2 E.U./dL
pH, UA: 5 (ref 5.0–8.0)

## 2018-04-10 LAB — URINE CULTURE: ORGANISM ID, BACTERIA: NO GROWTH

## 2018-04-10 MED ORDER — URIBEL 118 MG PO CAPS: 1.0000 | ORAL_CAPSULE | Freq: Four times a day (QID) | ORAL | 0 refills | Status: DC | PRN

## 2018-04-10 NOTE — Telephone Encounter (Signed)
Reviewed with Dr. Sabra Heck. Recommends OV today for further evaluation. Urine culture negative.   Call returned to patient, advised as seen above. OV scheduled for today at 3:45pm with Dr. Sabra Heck.   Patient verbalizes understanding and is agreeable. Encounter closed.

## 2018-04-10 NOTE — Telephone Encounter (Signed)
Dr. Sabra Heck -please review results dated 04/01/18 and 04/08/18

## 2018-04-10 NOTE — Progress Notes (Signed)
GYNECOLOGY  VISIT  CC:   Urinary urgency  HPI: 42 y.o. G2P1 Divorced White or Caucasian female here for urgency of urination.  Reports she slept through the night for the first time since surgery and work up this morning having to void.  Felt significant spasm with voiding.  Denies actual dysuria.  Has no pelvic pain.  Has not seen any vaginal bleeding or blood in urine.  Feels like urgency and spasms have improved through the day and she feels fine now.  Was seen on Tuesday.  Exam was normal but pelvic exam was not performed.  Requested she return for full pelvic exam.  GYNECOLOGIC HISTORY: No LMP recorded (lmp unknown). Patient has had a hysterectomy. Contraception: hysterectomy  Menopausal hormone therapy: none  Patient Active Problem List   Diagnosis Date Noted  . Allergic conjunctivitis 08/27/2017  . Anosmia 11/19/2016  . Mild persistent asthma 08/14/2016  . History of food allergy 08/14/2016  . Allergic rhinitis 08/14/2016  . Hearing loss 05/29/2015  . 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 Endometriosis 05/10/2014  . B12 DEFICIENCY 03/20/2007    Past Medical History:  Diagnosis Date  . Abnormal Pap smear    history of colposcopy ECC with atypical squamous metaplasia  . Anxiety   . Asthma   . Bilateral hearing loss    hearing aid in right ear and cochlear implant in left ear  . Bursitis of shoulder, right   . Congenital hearing loss    started age 46 -   . Depression    no meds  . Endometriosis 10/10  . Headache    otc med prn  . Hx of migraines    increased frequency with estrogen containing OCPs  . IUD (intrauterine device) in place 08/2017   Mirena  . Panic attacks   . Recurrent upper respiratory infection (URI)   . Seasonal allergies   . SVD (spontaneous vaginal delivery)    x 1    Past Surgical History:  Procedure Laterality Date  . BREAST REDUCTION SURGERY  9/10  . COCHLEAR IMPLANT Left 2014   moval of Envoy device  . CYSTOSCOPY N/A 04/01/2018   Procedure: CYSTOSCOPY;  Surgeon: Megan Salon, MD;  Location: [facility];  Service: Gynecology;  Laterality: N/A;  . IMPLANTATION OF ENVOY ESTEEM HEARING DEVICE     for hearing loss - now has been removed and replaced with cochlear implant 2014  . IUD REMOVAL N/A 04/01/2018   Procedure: INTRAUTERINE DEVICE (IUD) REMOVAL  possible;  Surgeon: Megan Salon, MD;  Location: [facility];  Service: Gynecology;  Laterality: N/A;  possible  . TOTAL LAPAROSCOPIC HYSTERECTOMY WITH SALPINGECTOMY Bilateral 04/01/2018   Procedure: TOTAL LAPAROSCOPIC HYSTERECTOMY WITH SALPINGECTOMY WITH EXCISION OF ENDOMETRIOSIS;  Surgeon: Megan Salon, MD;  Location: [facility];  Service: Gynecology;  Laterality: Bilateral;  . TUBAL LIGATION  10/10   cautery of endometriosis  . WISDOM TOOTH EXTRACTION      MEDS:   Current Outpatient Medications on File Prior to Visit  Medication Sig Dispense Refill  . albuterol (VENTOLIN HFA) 108 (90 Base) MCG/ACT inhaler Inhale 2 puffs into the lungs every 4 (four) hours as needed for wheezing or shortness of breath. 1 Inhaler 1  . ALPRAZolam (XANAX) 0.5 MG tablet Take 1 tablet (0.5 mg total) by mouth 2 (two) times daily as needed for anxiety. 30 tablet 0  . azelastine (ASTELIN) 0.1 % nasal spray SPRAY 2 SPRAYS INTO EACH NOSTRIL TWICE DAILY (Patient taking differently: Place 2  sprays into both nostrils 2 (two) times daily as needed for rhinitis. ) 30 mL 2  . beclomethasone (QVAR) 80 MCG/ACT inhaler Inhale into the lungs 2 (two) times daily.    . Calcium Carbonate (CALCIUM 600 PO) Take 1 tablet by mouth daily.    . Cholecalciferol (VITAMIN D) 2000 units tablet Take 2,000 Units by mouth daily.     . diclofenac sodium (VOLTAREN) 1 % GEL Apply 1 application topically 3 (three) times daily as needed (shoulder pain).    Marland Kitchen ELDERBERRY PO Take 1 tablet by mouth daily as needed (for cold).     . fexofenadine-pseudoephedrine (ALLEGRA-D 24) 180-240 MG 24 hr tablet Take 1 tablet by mouth daily.    .  fluticasone (FLOVENT HFA) 110 MCG/ACT inhaler Inhale 1 puff into the lungs 2 (two) times daily. 12 g 5  . Fluticasone Propionate (XHANCE) 93 MCG/ACT EXHU Place 2 puffs into both nostrils 2 (two) times daily. 16 mL 12  . folic acid (FOLVITE) 371 MCG tablet Take 400 mcg by mouth daily.    Marland Kitchen ibuprofen (ADVIL,MOTRIN) 800 MG tablet Take 1 tablet (800 mg total) by mouth every 8 (eight) hours as needed. 30 tablet 0  . Multiple Vitamins-Minerals (MULTIVITAMIN GUMMIES ADULT PO) Take 1 each by mouth daily. Super food    . oxyCODONE-acetaminophen (PERCOCET/ROXICET) 5-325 MG tablet Take 1-2 tablets by mouth every 6 (six) hours as needed for moderate pain or severe pain. 30 tablet 0  . Probiotic Product (PROBIOTIC-10) CHEW Chew 1 each by mouth daily.     . promethazine (PHENERGAN) 12.5 MG tablet 1-2 tablets every six hours as needed for nausea 30 tablet 0   No current facility-administered medications on file prior to visit.     ALLERGIES: Augmentin [amoxicillin-pot clavulanate]; Ciprofloxacin; Codeine; Shrimp [shellfish allergy]; Sulfa antibiotics; and Z-pak [azithromycin]  Family History  Problem Relation Age of Onset  . Deafness Mother   . Diabetes Mother   . Heart Problems Mother        tachycardia  . Ovarian cancer Mother 45  . Deafness Father   . Hypertension Father   . Cirrhosis Father        non alcoholic  . COPD Father   . Hyperlipidemia Father   . Down syndrome Sister   . Diabetes Paternal Grandmother   . Allergic rhinitis Neg Hx   . Angioedema Neg Hx   . Asthma Neg Hx   . Eczema Neg Hx   . Immunodeficiency Neg Hx   . Urticaria Neg Hx     SH:  Divorced, non smoker  Review of Systems  Genitourinary: Positive for frequency and urgency.  All other systems reviewed and are negative.   PHYSICAL EXAMINATION:    BP 110/70 (BP Location: Right Arm, Patient Position: Sitting, Cuff Size: Normal)   Pulse 92   Temp 97.7 F (36.5 C) (Oral)   Resp 16   Ht 5' 5.5" (1.664 m)   Wt 153  lb 9.6 oz (69.7 kg)   LMP  (LMP Unknown) Comment: Mirena IUD - Inserted 08/2017  BMI 25.17 kg/m     General appearance: alert, cooperative and appears stated age CV:  Regular rate and rhythm Lungs:  clear to auscultation, no wheezes, rales or rhonchi, symmetric air entry Abdomen: soft, non-tender; bowel sounds normal; no masses,  no organomegaly Inc: C/D/I Lymph:  no inguinal LAD noted  Pelvic: External genitalia:  no lesions              Urethra:  normal appearing urethra with no masses, tenderness or lesions              Bartholins and Skenes: normal                 Vagina: normal appearing vagina with normal color and discharge, no lesions              Cervix: absent and intact cuff that is healing well, no masses/fullness/tenderness              Bimanual Exam:  Uterus:  uterus absent              Adnexa: no mass, fullness, tenderness  Chaperone was present for exam.  Assessment: Urinary urgency, normal exam, negative urine culture  Plan: Will repeat urine culture Uribel to pharmacy for pt to use for symptom relief.  I feel this is likely just due to post surgical healing.  Precautions given for calling for repeat visit.

## 2018-04-10 NOTE — Telephone Encounter (Signed)
    Patient calling for lab results 

## 2018-04-10 NOTE — Telephone Encounter (Signed)
Patient sent the following correspondence through [facility]. Routing to triage to assist patient with request.  Hey Dr. Sabra Heck.  I called this morning. I am pretty sure I have a UTI.  I feel like I have to pee all the time. A lot of pressure on my abdomen.  I know you ordered a test Tuesday when I was there.  I called to see if results came back yet but I clearly need antibiotics.  Very uncomfortable today. I actually slept 8 hours for the first time since surgery but been hurting pretty much since I woke up.    I'm still taking ibuprofen and alternating with tylenol. Thanks.

## 2018-04-11 LAB — URINE CULTURE: Organism ID, Bacteria: NO GROWTH

## 2018-04-14 ENCOUNTER — Ambulatory Visit (INDEPENDENT_AMBULATORY_CARE_PROVIDER_SITE_OTHER): Payer: 59

## 2018-04-14 DIAGNOSIS — J309 Allergic rhinitis, unspecified: Secondary | ICD-10-CM | POA: Diagnosis not present

## 2018-04-21 ENCOUNTER — Telehealth: Payer: Self-pay | Admitting: Obstetrics & Gynecology

## 2018-04-21 NOTE — Telephone Encounter (Signed)
Patient is experiencing some abdominal pain today. She is scheduled for a post op on Thursday.

## 2018-04-21 NOTE — Telephone Encounter (Signed)
Spoke with patient. Patient is s/p TLH on 04/01/18. Patient reports she feels tired and is napping more since last wk. Reached for a casserole dish from cabinet on 10/11, felt a pulling sensation and abdomen now "feels sore". Patient states she feels she has overdone herself since last Wednesday. Taking Uribel qid, denies any urinary symptoms. Does feel discomfort in bladder if she holds her urine too long. Denies fever/chills, N/V, or vaginal bleeding. Reports abdomen is soft, regular BMs. Describes pain as throbbing, 5/10 when ambulating. Patient is alternating tylenol and 400 mq motrin prn.   Recommended OV with covering provider for further evaluation. Patient declined. Patient is scheduled for f/u on 10/17 with Dr. Sabra Heck. Recommended earlier OV, rescheduled to 10/16 at 10am with Dr. Sabra Heck. Patient is going to increase Motrin to 800 mg q8hrs. Advised to return call to office if pain increases or any new symptoms develop, ER precautions after hours. Advised will review with covering provider and return call if any additional recommendations.   Routing to provider for final review. Patient is agreeable to disposition. Will close encounter.  Cc: Dr. Sabra Heck

## 2018-04-23 ENCOUNTER — Other Ambulatory Visit: Payer: Self-pay

## 2018-04-23 ENCOUNTER — Encounter: Payer: Self-pay | Admitting: Obstetrics & Gynecology

## 2018-04-23 ENCOUNTER — Ambulatory Visit (INDEPENDENT_AMBULATORY_CARE_PROVIDER_SITE_OTHER): Payer: 59 | Admitting: Obstetrics & Gynecology

## 2018-04-23 VITALS — BP 94/80 | HR 80 | Resp 16 | Ht 65.5 in | Wt 152.0 lb

## 2018-04-23 DIAGNOSIS — R3989 Other symptoms and signs involving the genitourinary system: Secondary | ICD-10-CM

## 2018-04-23 DIAGNOSIS — R1031 Right lower quadrant pain: Secondary | ICD-10-CM

## 2018-04-23 MED ORDER — IBUPROFEN 800 MG PO TABS: 800.0000 mg | ORAL_TABLET | Freq: Three times a day (TID) | ORAL | 0 refills | Status: DC | PRN

## 2018-04-23 NOTE — Progress Notes (Signed)
GYNECOLOGY  VISIT  CC:   Post op pain  HPI: 42 y.o. G2P1 Divorced White or Caucasian female here for R pelvic pain.  States her energy continues to be low and that she is not sleeping.  Feels when her bladder is very full, she has a lot of pain and spasms.  This is very uncomfortable first thing in the morning.  Uribel has helped.  Two urine cultures have been negative.  She denies fever, vaginal discharge or vaginal bleeding.  Is having no issues with emptying bladder and is having normal bowel movements.  She is not taking any narcotics as she does not like the way she feels with them.  Only using motrin.    GYNECOLOGIC HISTORY: Patient's last menstrual period was 12/03/2017. Contraception: hysterectomy  Menopausal hormone therapy: none  Patient Active Problem List   Diagnosis Date Noted  . Allergic conjunctivitis 08/27/2017  . Anosmia 11/19/2016  . Mild persistent asthma 08/14/2016  . History of food allergy 08/14/2016  . Allergic rhinitis 08/14/2016  . Hearing loss 05/29/2015  . 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 Endometriosis 05/10/2014  . B12 DEFICIENCY 03/20/2007    Past Medical History:  Diagnosis Date  . Abnormal Pap smear    history of colposcopy ECC with atypical squamous metaplasia  . Anxiety   . Asthma   . Bilateral hearing loss    hearing aid in right ear and cochlear implant in left ear  . Bursitis of shoulder, right   . Congenital hearing loss    started age 88 -   . Depression    no meds  . Endometriosis 10/10  . Headache    otc med prn  . Hx of migraines    increased frequency with estrogen containing OCPs  . IUD (intrauterine device) in place 08/2017   Mirena  . Panic attacks   . Recurrent upper respiratory infection (URI)   . Seasonal allergies   . SVD (spontaneous vaginal delivery)    x 1    Past Surgical History:  Procedure Laterality Date  . BREAST REDUCTION SURGERY  9/10  . COCHLEAR IMPLANT Left 2014   moval of Envoy device  .  CYSTOSCOPY N/A 04/01/2018   Procedure: CYSTOSCOPY;  Surgeon: Megan Salon, MD;  Location: [facility];  Service: Gynecology;  Laterality: N/A;  . IMPLANTATION OF ENVOY ESTEEM HEARING DEVICE     for hearing loss - now has been removed and replaced with cochlear implant 2014  . IUD REMOVAL N/A 04/01/2018   Procedure: INTRAUTERINE DEVICE (IUD) REMOVAL  possible;  Surgeon: Megan Salon, MD;  Location: [facility];  Service: Gynecology;  Laterality: N/A;  possible  . TOTAL LAPAROSCOPIC HYSTERECTOMY WITH SALPINGECTOMY Bilateral 04/01/2018   Procedure: TOTAL LAPAROSCOPIC HYSTERECTOMY WITH SALPINGECTOMY WITH EXCISION OF ENDOMETRIOSIS;  Surgeon: Megan Salon, MD;  Location: [facility];  Service: Gynecology;  Laterality: Bilateral;  . TUBAL LIGATION  10/10   cautery of endometriosis  . WISDOM TOOTH EXTRACTION      MEDS:   Current Outpatient Medications on File Prior to Visit  Medication Sig Dispense Refill  . albuterol (VENTOLIN HFA) 108 (90 Base) MCG/ACT inhaler Inhale 2 puffs into the lungs every 4 (four) hours as needed for wheezing or shortness of breath. 1 Inhaler 1  . ALPRAZolam (XANAX) 0.5 MG tablet Take 1 tablet (0.5 mg total) by mouth 2 (two) times daily as needed for anxiety. 30 tablet 0  . azelastine (ASTELIN) 0.1 % nasal spray SPRAY 2 SPRAYS  INTO EACH NOSTRIL TWICE DAILY (Patient taking differently: Place 2 sprays into both nostrils 2 (two) times daily as needed for rhinitis. ) 30 mL 2  . beclomethasone (QVAR) 80 MCG/ACT inhaler Inhale into the lungs 2 (two) times daily.    . Calcium Carbonate (CALCIUM 600 PO) Take 1 tablet by mouth daily.    . Cholecalciferol (VITAMIN D) 2000 units tablet Take 2,000 Units by mouth daily.     . diclofenac sodium (VOLTAREN) 1 % GEL Apply 1 application topically 3 (three) times daily as needed (shoulder pain).    Marland Kitchen ELDERBERRY PO Take 1 tablet by mouth daily as needed (for cold).     . fexofenadine-pseudoephedrine (ALLEGRA-D 24) 180-240 MG 24 hr tablet Take 1 tablet by  mouth daily.    . fluticasone (FLOVENT HFA) 110 MCG/ACT inhaler Inhale 1 puff into the lungs 2 (two) times daily. 12 g 5  . Fluticasone Propionate (XHANCE) 93 MCG/ACT EXHU Place 2 puffs into both nostrils 2 (two) times daily. 16 mL 12  . folic acid (FOLVITE) 222 MCG tablet Take 400 mcg by mouth daily.    Marland Kitchen ibuprofen (ADVIL,MOTRIN) 800 MG tablet Take 1 tablet (800 mg total) by mouth every 8 (eight) hours as needed. 30 tablet 0  . Meth-Hyo-M Bl-Na Phos-Ph Sal (URIBEL) 118 MG CAPS Take 1 capsule (118 mg total) by mouth 4 (four) times daily as needed. 30 capsule 0  . metroNIDAZOLE (METROCREAM) 0.75 % cream 2 (two) times daily. face    . Multiple Vitamins-Minerals (MULTIVITAMIN GUMMIES ADULT PO) Take 1 each by mouth daily. Super food    . oxyCODONE-acetaminophen (PERCOCET/ROXICET) 5-325 MG tablet Take 1-2 tablets by mouth every 6 (six) hours as needed for moderate pain or severe pain. 30 tablet 0  . Probiotic Product (PROBIOTIC-10) CHEW Chew 1 each by mouth daily.     . promethazine (PHENERGAN) 12.5 MG tablet 1-2 tablets every six hours as needed for nausea 30 tablet 0   No current facility-administered medications on file prior to visit.     ALLERGIES: Augmentin [amoxicillin-pot clavulanate]; Ciprofloxacin; Codeine; Shrimp [shellfish allergy]; Sulfa antibiotics; and Z-pak [azithromycin]  Family History  Problem Relation Age of Onset  . Deafness Mother   . Diabetes Mother   . Heart Problems Mother        tachycardia  . Ovarian cancer Mother 94  . Deafness Father   . Hypertension Father   . Cirrhosis Father        non alcoholic  . COPD Father   . Hyperlipidemia Father   . Down syndrome Sister   . Diabetes Paternal Grandmother   . Allergic rhinitis Neg Hx   . Angioedema Neg Hx   . Asthma Neg Hx   . Eczema Neg Hx   . Immunodeficiency Neg Hx   . Urticaria Neg Hx     SH:  Divorced, non smoker  Review of Systems  Genitourinary: Positive for frequency, pelvic pain and urgency.  All  other systems reviewed and are negative.   PHYSICAL EXAMINATION:    BP 94/80 (BP Location: Right Arm, Patient Position: Sitting, Cuff Size: Normal)   Pulse 80   Resp 16   Ht 5' 5.5" (1.664 m)   Wt 152 lb (68.9 kg)   LMP 12/03/2017 Comment: Mirena IUD - Inserted 08/2017  BMI 24.91 kg/m     General appearance: alert, cooperative and appears stated age Neck: no adenopathy, supple, symmetrical, trachea midline and thyroid normal to inspection and palpation CV:  Regular rate and  rhythm Lungs:  clear to auscultation, no wheezes, rales or rhonchi, symmetric air entry Abdomen: soft, non-tender; bowel sounds normal; no masses,  no organomegaly Inc:  C/D/I Lymph:  no inguinal LAD noted  Pelvic: External genitalia:  no lesions              Urethra:  normal appearing urethra with no masses, tenderness or lesions              Bartholins and Skenes: normal                 Vagina: normal appearing vagina with normal color and discharge, no lesions              Cervix: absent and vagina cuff well approximated and healing appropriately, no masses or firmness noted              Bimanual Exam:  Uterus:  uterus absent              Adnexa: no mass, fullness, tenderness  Chaperone was present for exam.  Assessment: Continued post op pain, fatigue, and bladder spasm  Plan: Urine culture will be repeated CBC and CMP, TSH, Vit D ordered Iron levels also ordered Motrin 800mg  tid as needed for pain to pharmacy.

## 2018-04-24 ENCOUNTER — Ambulatory Visit: Payer: Self-pay | Admitting: Obstetrics & Gynecology

## 2018-04-24 LAB — IRON,TIBC AND FERRITIN PANEL
Ferritin: 84 ng/mL (ref 15–150)
IRON: 108 ug/dL (ref 27–159)
Iron Saturation: 38 % (ref 15–55)
Total Iron Binding Capacity: 285 ug/dL (ref 250–450)
UIBC: 177 ug/dL (ref 131–425)

## 2018-04-24 LAB — COMPREHENSIVE METABOLIC PANEL
ALK PHOS: 50 IU/L (ref 39–117)
ALT: 14 IU/L (ref 0–32)
AST: 14 IU/L (ref 0–40)
Albumin/Globulin Ratio: 1.7 (ref 1.2–2.2)
Albumin: 4.5 g/dL (ref 3.5–5.5)
BILIRUBIN TOTAL: 0.4 mg/dL (ref 0.0–1.2)
BUN/Creatinine Ratio: 11 (ref 9–23)
BUN: 9 mg/dL (ref 6–24)
CHLORIDE: 101 mmol/L (ref 96–106)
CO2: 25 mmol/L (ref 20–29)
Calcium: 9 mg/dL (ref 8.7–10.2)
Creatinine, Ser: 0.83 mg/dL (ref 0.57–1.00)
GFR calc non Af Amer: 87 mL/min/{1.73_m2} (ref >59)
GFR, EST AFRICAN AMERICAN: 100 mL/min/{1.73_m2} (ref >59)
Globulin, Total: 2.6 g/dL (ref 1.5–4.5)
Glucose: 82 mg/dL (ref 65–99)
POTASSIUM: 4.7 mmol/L (ref 3.5–5.2)
Sodium: 139 mmol/L (ref 134–144)
TOTAL PROTEIN: 7.1 g/dL (ref 6.0–8.5)

## 2018-04-24 LAB — CBC WITH DIFFERENTIAL/PLATELET
BASOS ABS: 0 10*3/uL (ref 0.0–0.2)
Basos: 1 %
EOS (ABSOLUTE): 0.5 10*3/uL — AB (ref 0.0–0.4)
Eos: 9 %
Hematocrit: 41.1 % (ref 34.0–46.6)
Hemoglobin: 14 g/dL (ref 11.1–15.9)
Immature Grans (Abs): 0 10*3/uL (ref 0.0–0.1)
Immature Granulocytes: 0 %
LYMPHS: 31 %
Lymphocytes Absolute: 1.9 10*3/uL (ref 0.7–3.1)
MCH: 31.8 pg (ref 26.6–33.0)
MCHC: 34.1 g/dL (ref 31.5–35.7)
MCV: 93 fL (ref 79–97)
Monocytes Absolute: 0.3 10*3/uL (ref 0.1–0.9)
Monocytes: 5 %
Neutrophils Absolute: 3.3 10*3/uL (ref 1.4–7.0)
Neutrophils: 54 %
PLATELETS: 298 10*3/uL (ref 150–450)
RBC: 4.4 x10E6/uL (ref 3.77–5.28)
RDW: 12.4 % (ref 12.3–15.4)
WBC: 6 10*3/uL (ref 3.4–10.8)

## 2018-04-24 LAB — TSH: TSH: 1.35 u[IU]/mL (ref 0.450–4.500)

## 2018-04-24 LAB — VITAMIN D 25 HYDROXY (VIT D DEFICIENCY, FRACTURES): VIT D 25 HYDROXY: 49 ng/mL (ref 30.0–100.0)

## 2018-04-24 LAB — URINE CULTURE: Organism ID, Bacteria: NO GROWTH

## 2018-04-25 ENCOUNTER — Telehealth: Payer: Self-pay | Admitting: Obstetrics & Gynecology

## 2018-04-25 ENCOUNTER — Other Ambulatory Visit: Payer: Self-pay | Admitting: Obstetrics & Gynecology

## 2018-04-25 ENCOUNTER — Encounter: Payer: Self-pay | Admitting: Obstetrics & Gynecology

## 2018-04-25 MED ORDER — FLAVOXATE HCL 100 MG PO TABS: 100.0000 mg | ORAL_TABLET | Freq: Three times a day (TID) | ORAL | 0 refills | Status: DC | PRN

## 2018-04-25 NOTE — Telephone Encounter (Signed)
Spoke with patient, advised as seen below per Dr. Sabra Heck. Patient states she has received an e-mail stating her leave was extended to 05/12/18. Patient will pick up new Rx and provide update next week.   Routing to provider for final review. Patient is agreeable to disposition. Will close encounter.  Cc: Lerry Liner

## 2018-04-25 NOTE — Telephone Encounter (Signed)
-----   Message from Megan Salon, MD sent at 04/25/2018 10:40 AM EDT ----- Please let pt know her urine culture was again negative and all the blood work was negative.  I have not signed anything yet to extend her FMLA.  Just please let he know this.  I want to try something different for bladder spasms.  She has been on uribel but I'd like to switch to [employer].  This is 100mg  and can be increased in dosage if needed.  It can be taken 3 times daily.  Rx has already been sent to [facility].  Thanks.

## 2018-04-25 NOTE — Telephone Encounter (Signed)
Patient sent the following correspondence through [facility]. Routing to triage to assist patient with request.  Hi Dr. Sabra Heck,    Im out of the blue pills, can you refill? Did the lab work come back?    Thanks!  Margaret Ford

## 2018-04-28 ENCOUNTER — Telehealth: Payer: Self-pay | Admitting: Obstetrics & Gynecology

## 2018-04-28 ENCOUNTER — Encounter: Payer: Self-pay | Admitting: Obstetrics & Gynecology

## 2018-04-28 NOTE — Telephone Encounter (Signed)
Routing patient update to Dr. Sabra Heck.

## 2018-04-28 NOTE — Telephone Encounter (Signed)
Patient sent the following correspondence through [facility]. Routing to triage to assist patient with request.  Dr. Sabra Heck,    Pain is improving, not as bad.  I still feel pressure in my abdomen though. I dont think its as bad as it was, but let me see how I do thru tomorrow.  I forgot to take one of the bladder pills yesterday, so that may be why.    Just checking in, thanks!

## 2018-04-29 ENCOUNTER — Ambulatory Visit (INDEPENDENT_AMBULATORY_CARE_PROVIDER_SITE_OTHER): Payer: 59 | Admitting: *Deleted

## 2018-04-29 DIAGNOSIS — J309 Allergic rhinitis, unspecified: Secondary | ICD-10-CM | POA: Diagnosis not present

## 2018-05-01 ENCOUNTER — Telehealth: Payer: Self-pay | Admitting: Obstetrics & Gynecology

## 2018-05-01 NOTE — Telephone Encounter (Signed)
Spoke with patient.  Pain and pressure in abdomen is improving, controlled with ibuprofen q8hrs and urispas q5-6 hrs. No new symptoms. Any additional recommendations? Earlier OV?  FMLA ends 11/4, next OV 11/5.   Advised I will review plan of care with Dr. Sabra Heck and return call.   Dr. Sabra Heck -please review. OK to move 6 wk post op up to earlier date?

## 2018-05-01 NOTE — Telephone Encounter (Signed)
Patient is following up on a mychart message to Dr Sabra Heck.

## 2018-05-02 NOTE — Telephone Encounter (Signed)
Yes, will need earlier appt. Thanks.

## 2018-05-02 NOTE — Telephone Encounter (Signed)
Spoke with patient. Post op appt rescheduled to 05/12/18 at 2:30pm with Dr. Sabra Heck. Patient verbalizes understanding and is agreeable. Encounter closed.

## 2018-05-07 DIAGNOSIS — Z1231 Encounter for screening mammogram for malignant neoplasm of breast: Secondary | ICD-10-CM | POA: Diagnosis not present

## 2018-05-12 ENCOUNTER — Ambulatory Visit: Payer: Self-pay | Admitting: Obstetrics & Gynecology

## 2018-05-12 NOTE — Progress Notes (Deleted)
Post Operative Visit  Procedure: Total Laparoscopic Hysterectomy with Salpingectomy with Excision of Endometriosis. Cystoscopy. IUD removal.  Days Post-op: 41  Subjective: ***  Objective: LMP  (LMP Unknown) Comment: Mirena IUD - Inserted 08/2017  EXAM General: {Exam; general:16600} Resp: {Exam; lung:16931} Cardio: {Exam; heart:5510} GI: {Exam, MB:8675449} Extremities: {Exam; extremity:5109} Vaginal Bleeding: {exam; vaginal bleeding:3041122}  Assessment: s/p ***  Plan: Recheck {NUMBER 1-10:22536} weeks ***

## 2018-05-13 ENCOUNTER — Telehealth: Payer: Self-pay | Admitting: Obstetrics & Gynecology

## 2018-05-13 ENCOUNTER — Encounter: Payer: Self-pay | Admitting: Obstetrics & Gynecology

## 2018-05-13 ENCOUNTER — Ambulatory Visit (INDEPENDENT_AMBULATORY_CARE_PROVIDER_SITE_OTHER): Payer: 59 | Admitting: Obstetrics & Gynecology

## 2018-05-13 ENCOUNTER — Ambulatory Visit: Payer: 59 | Admitting: Obstetrics & Gynecology

## 2018-05-13 VITALS — BP 110/70 | HR 68 | Resp 14 | Ht 65.5 in | Wt 150.6 lb

## 2018-05-13 DIAGNOSIS — R3915 Urgency of urination: Secondary | ICD-10-CM

## 2018-05-13 DIAGNOSIS — N898 Other specified noninflammatory disorders of vagina: Secondary | ICD-10-CM | POA: Diagnosis not present

## 2018-05-13 DIAGNOSIS — Z9889 Other specified postprocedural states: Secondary | ICD-10-CM

## 2018-05-13 LAB — POCT URINALYSIS DIPSTICK
Bilirubin, UA: NEGATIVE
Blood, UA: NEGATIVE
Glucose, UA: NEGATIVE
Ketones, UA: NEGATIVE
LEUKOCYTES UA: NEGATIVE
NITRITE UA: NEGATIVE
PH UA: 7 (ref 5.0–8.0)
Protein, UA: NEGATIVE
UROBILINOGEN UA: 0.2 U/dL

## 2018-05-13 MED ORDER — FLUCONAZOLE 150 MG PO TABS: 150.0000 mg | ORAL_TABLET | Freq: Once | ORAL | 0 refills | Status: AC

## 2018-05-13 NOTE — Addendum Note (Signed)
Addended by: Polly Cobia on: 05/13/2018 01:33 PM   Modules accepted: Orders

## 2018-05-13 NOTE — Progress Notes (Signed)
Post Operative Visit  Procedure: Total Laparoscopic Hysterectomy with Salpingectomy with Excision of endometriosis, Cystoscopy, IUD removal Days Post-op: 42  Subjective: Reports she is doing well.  Energy is better.  Bladder spasm are much.  Taking anti-spasmotic only every 12 hours.  Today, it's been not almost 24 hours and she's having some mild urgency symptoms.  She does feel like she is emptying her bladder fully and urinary frequency has decreased the last couple of week.    Thinks she does have some vaginal discharge that she would like tested.  Off all pain medication.    Objective: BP 110/70 (BP Location: Right Arm, Patient Position: Sitting, Cuff Size: Normal)   Pulse 68   Resp 14   Ht 5' 5.5" (1.664 m)   Wt 150 lb 9.6 oz (68.3 kg)   LMP  (LMP Unknown) Comment: Mirena IUD - Inserted 08/2017  BMI 24.68 kg/m   EXAM General: alert and no distress Resp: clear to auscultation bilaterally Cardio: regular rate and rhythm, S1, S2 normal, no murmur, click, rub or gallop GI: soft, non-tender; bowel sounds normal; no masses,  no organomegaly and incision: clean, dry and intact Extremities: extremities normal, atraumatic, no cyanosis or edema Vaginal Bleeding: none  Gyn:  Healing well, cuff non tender, no fullness, no masses, cuff well approximated  Assessment: s/p TLH/bilateral salpingectomy/cystoscopy Post op bladder spasms Vaginal discharge  Plan: Pt is going to communicate with me in about six weeks about bladder issues Affirm pending.  Diflucan 150mg  po x 1, repeat I n72 hours.  #2/0RF Does not need urispas refill right now Pt may return to work full time without restrictions

## 2018-05-13 NOTE — Telephone Encounter (Signed)
Patient sent the following correspondence through [facility]. Routing to triage to assist patient with request.  Stupid question. I picked up my script. There are 2 pills.  Do I take one today and one tomorrow? The instructions didn't say.  I just wanted to verify since I have 2 pills. Thanks!

## 2018-05-13 NOTE — Telephone Encounter (Signed)
Spoke with patient, called to confirm Diflucan instructions. Advised as seen below per OV notes. Patient verbalizes understanding and is agreeable. Encounter closed.    Reviewed OV notes dated 05/13/18 Diflucan 150mg  po x 1, repeat I n72 hours. #2/0RF

## 2018-05-14 ENCOUNTER — Ambulatory Visit (INDEPENDENT_AMBULATORY_CARE_PROVIDER_SITE_OTHER): Payer: 59 | Admitting: *Deleted

## 2018-05-14 DIAGNOSIS — J309 Allergic rhinitis, unspecified: Secondary | ICD-10-CM | POA: Diagnosis not present

## 2018-05-14 LAB — VAGINITIS/VAGINOSIS, DNA PROBE
Candida Species: POSITIVE — AB
GARDNERELLA VAGINALIS: NEGATIVE
Trichomonas vaginosis: NEGATIVE

## 2018-05-23 ENCOUNTER — Ambulatory Visit (INDEPENDENT_AMBULATORY_CARE_PROVIDER_SITE_OTHER): Payer: 59 | Admitting: *Deleted

## 2018-05-23 DIAGNOSIS — J309 Allergic rhinitis, unspecified: Secondary | ICD-10-CM

## 2018-05-27 ENCOUNTER — Encounter: Payer: Self-pay | Admitting: Obstetrics & Gynecology

## 2018-05-27 DIAGNOSIS — J3089 Other allergic rhinitis: Secondary | ICD-10-CM | POA: Diagnosis not present

## 2018-05-27 NOTE — Progress Notes (Signed)
VIALS EXP 05-29-19 

## 2018-05-30 ENCOUNTER — Ambulatory Visit (INDEPENDENT_AMBULATORY_CARE_PROVIDER_SITE_OTHER): Payer: 59

## 2018-05-30 DIAGNOSIS — J309 Allergic rhinitis, unspecified: Secondary | ICD-10-CM | POA: Diagnosis not present

## 2018-06-01 ENCOUNTER — Encounter: Payer: Self-pay | Admitting: Obstetrics & Gynecology

## 2018-06-11 ENCOUNTER — Ambulatory Visit (INDEPENDENT_AMBULATORY_CARE_PROVIDER_SITE_OTHER): Payer: 59 | Admitting: *Deleted

## 2018-06-11 DIAGNOSIS — J309 Allergic rhinitis, unspecified: Secondary | ICD-10-CM | POA: Diagnosis not present

## 2018-06-27 ENCOUNTER — Ambulatory Visit (INDEPENDENT_AMBULATORY_CARE_PROVIDER_SITE_OTHER): Payer: 59 | Admitting: *Deleted

## 2018-06-27 DIAGNOSIS — J309 Allergic rhinitis, unspecified: Secondary | ICD-10-CM | POA: Diagnosis not present

## 2018-07-04 ENCOUNTER — Ambulatory Visit (INDEPENDENT_AMBULATORY_CARE_PROVIDER_SITE_OTHER): Payer: 59

## 2018-07-04 DIAGNOSIS — J309 Allergic rhinitis, unspecified: Secondary | ICD-10-CM

## 2018-07-10 ENCOUNTER — Ambulatory Visit (INDEPENDENT_AMBULATORY_CARE_PROVIDER_SITE_OTHER): Payer: 59 | Admitting: *Deleted

## 2018-07-10 DIAGNOSIS — J309 Allergic rhinitis, unspecified: Secondary | ICD-10-CM | POA: Diagnosis not present

## 2018-07-18 ENCOUNTER — Ambulatory Visit (INDEPENDENT_AMBULATORY_CARE_PROVIDER_SITE_OTHER): Payer: 59 | Admitting: *Deleted

## 2018-07-18 DIAGNOSIS — J309 Allergic rhinitis, unspecified: Secondary | ICD-10-CM | POA: Diagnosis not present

## 2018-07-25 ENCOUNTER — Ambulatory Visit: Payer: 59 | Admitting: Obstetrics & Gynecology

## 2018-07-25 ENCOUNTER — Encounter: Payer: Self-pay | Admitting: Obstetrics & Gynecology

## 2018-07-25 ENCOUNTER — Other Ambulatory Visit: Payer: Self-pay

## 2018-07-25 ENCOUNTER — Ambulatory Visit (INDEPENDENT_AMBULATORY_CARE_PROVIDER_SITE_OTHER): Payer: 59 | Admitting: *Deleted

## 2018-07-25 VITALS — BP 102/70 | HR 80 | Resp 16 | Ht 65.5 in | Wt 158.4 lb

## 2018-07-25 DIAGNOSIS — N898 Other specified noninflammatory disorders of vagina: Secondary | ICD-10-CM | POA: Diagnosis not present

## 2018-07-25 DIAGNOSIS — J309 Allergic rhinitis, unspecified: Secondary | ICD-10-CM

## 2018-07-25 MED ORDER — FLUCONAZOLE 150 MG PO TABS: ORAL_TABLET | ORAL | 0 refills | Status: DC

## 2018-07-25 NOTE — Progress Notes (Signed)
GYNECOLOGY  VISIT  CC:   Vaginal discharge  HPI: 43 y.o. G2P1 Divorced White or Caucasian female here for vaginal discharge x 2 days.  Has been on antibiotics for docycyline for the past month.  Had recent URI.    Denies urinary symptoms.  Denies vaginal bleeding.  She has noticed some vaginal odor as well.    Not SA since surgery.    She's going to [employer] for several days.  Then she is going to [facility] to see a friend.   GYNECOLOGIC HISTORY: No LMP recorded (lmp unknown). Patient has had a hysterectomy. Contraception: hysterectomy  Menopausal hormone therapy: none  Patient Active Problem List   Diagnosis Date Noted  . Allergic conjunctivitis 08/27/2017  . Anosmia 11/19/2016  . Mild persistent asthma 08/14/2016  . History of food allergy 08/14/2016  . Allergic rhinitis 08/14/2016  . Hearing loss 05/29/2015  . 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 Endometriosis 05/10/2014  . B12 DEFICIENCY 03/20/2007    Past Medical History:  Diagnosis Date  . Abnormal Pap smear    history of colposcopy ECC with atypical squamous metaplasia  . Anxiety   . Asthma   . Bilateral hearing loss    hearing aid in right ear and cochlear implant in left ear  . Bursitis of shoulder, right   . Congenital hearing loss    started age 47 -   . Depression    no meds  . Endometriosis 10/10  . Headache    otc med prn  . Hx of migraines    increased frequency with estrogen containing OCPs  . IUD (intrauterine device) in place 08/2017   Mirena  . Panic attacks   . Recurrent upper respiratory infection (URI)   . Seasonal allergies   . SVD (spontaneous vaginal delivery)    x 1    Past Surgical History:  Procedure Laterality Date  . BREAST REDUCTION SURGERY  9/10  . COCHLEAR IMPLANT Left 2014   moval of Envoy device  . CYSTOSCOPY N/A 04/01/2018   Procedure: CYSTOSCOPY;  Surgeon: Megan Salon, MD;  Location: [facility];  Service: Gynecology;  Laterality: N/A;  . IMPLANTATION OF ENVOY ESTEEM  HEARING DEVICE     for hearing loss - now has been removed and replaced with cochlear implant 2014  . IUD REMOVAL N/A 04/01/2018   Procedure: INTRAUTERINE DEVICE (IUD) REMOVAL  possible;  Surgeon: Megan Salon, MD;  Location: [facility];  Service: Gynecology;  Laterality: N/A;  possible  . TOTAL LAPAROSCOPIC HYSTERECTOMY WITH SALPINGECTOMY Bilateral 04/01/2018   Procedure: TOTAL LAPAROSCOPIC HYSTERECTOMY WITH SALPINGECTOMY WITH EXCISION OF ENDOMETRIOSIS;  Surgeon: Megan Salon, MD;  Location: [facility];  Service: Gynecology;  Laterality: Bilateral;  . TUBAL LIGATION  10/10   cautery of endometriosis  . WISDOM TOOTH EXTRACTION      MEDS:   Current Outpatient Medications on File Prior to Visit  Medication Sig Dispense Refill  . albuterol (VENTOLIN HFA) 108 (90 Base) MCG/ACT inhaler Inhale 2 puffs into the lungs every 4 (four) hours as needed for wheezing or shortness of breath. 1 Inhaler 1  . ALPRAZolam (XANAX) 0.5 MG tablet Take 1 tablet (0.5 mg total) by mouth 2 (two) times daily as needed for anxiety. 30 tablet 0  . azelastine (ASTELIN) 0.1 % nasal spray SPRAY 2 SPRAYS INTO EACH NOSTRIL TWICE DAILY (Patient taking differently: Place 2 sprays into both nostrils 2 (two) times daily as needed for rhinitis. ) 30 mL 2  . Calcium Carbonate (  CALCIUM 600 PO) Take 1 tablet by mouth daily.    . Cholecalciferol (VITAMIN D) 2000 units tablet Take 2,000 Units by mouth daily.     Marland Kitchen doxycycline (PERIOSTAT) 20 MG tablet Take 1 tablet by mouth 2 (two) times daily.    Marland Kitchen ELDERBERRY PO Take 1 tablet by mouth daily as needed (for cold).     . fexofenadine-pseudoephedrine (ALLEGRA-D 24) 180-240 MG 24 hr tablet Take 1 tablet by mouth daily.    . fluticasone (FLOVENT HFA) 110 MCG/ACT inhaler Inhale 1 puff into the lungs 2 (two) times daily. 12 g 5  . Fluticasone Propionate (XHANCE) 93 MCG/ACT EXHU Place 2 puffs into both nostrils 2 (two) times daily. 16 mL 12  . metroNIDAZOLE (METROCREAM) 0.75 % cream 2 (two) times  daily. face    . Multiple Vitamins-Minerals (MULTIVITAMIN GUMMIES ADULT PO) Take 1 each by mouth daily. Super food    . nystatin ointment (MYCOSTATIN)     . Probiotic Product (PROBIOTIC-10) CHEW Chew 1 each by mouth daily.     . promethazine (PHENERGAN) 12.5 MG tablet 1-2 tablets every six hours as needed for nausea 30 tablet 0   No current facility-administered medications on file prior to visit.     ALLERGIES: Augmentin [amoxicillin-pot clavulanate]; Ciprofloxacin; Codeine; Shrimp [shellfish allergy]; Sulfa antibiotics; and Z-pak [azithromycin]  Family History  Problem Relation Age of Onset  . Deafness Mother   . Diabetes Mother   . Heart Problems Mother        tachycardia  . Ovarian cancer Mother 81  . Deafness Father   . Hypertension Father   . Cirrhosis Father        non alcoholic  . COPD Father   . Hyperlipidemia Father   . Down syndrome Sister   . Diabetes Paternal Grandmother   . Allergic rhinitis Neg Hx   . Angioedema Neg Hx   . Asthma Neg Hx   . Eczema Neg Hx   . Immunodeficiency Neg Hx   . Urticaria Neg Hx     SH:  Divorced, non smoker  Review of Systems  All other systems reviewed and are negative.   PHYSICAL EXAMINATION:    BP 102/70 (BP Location: Right Arm, Patient Position: Sitting, Cuff Size: Normal)   Pulse 80   Resp 16   Ht 5' 5.5" (1.664 m)   Wt 158 lb 6.4 oz (71.8 kg)   LMP  (LMP Unknown)   BMI 25.96 kg/m     General appearance: alert, cooperative and appears stated age Abdomen: soft, non-tender; bowel sounds normal; no masses,  no organomegaly Lymph:  no inguinal LAD noted  Pelvic: External genitalia:  no lesions              Urethra:  normal appearing urethra with no masses, tenderness or lesions              Bartholins and Skenes: normal                 Vagina: normal appearing vagina with normal color, no lesions, scant discharge is present              Cervix: absent              Bimanual Exam:  Uterus:  uterus absent               Adnexa: no mass, fullness, tenderness  Chaperone was present for exam.  Assessment: Mild vaginal irritation and discharge Leaving tomorrow for trip to [facility]  and then [place]: Vaginitis testing obtained today.  Diflucan 150mg  po x 1, repeat 72 hours.  #2/0RF.  Results will be called to pt and additional recommendations made at that time.

## 2018-07-26 LAB — VAGINITIS/VAGINOSIS, DNA PROBE
CANDIDA SPECIES: NEGATIVE
GARDNERELLA VAGINALIS: NEGATIVE
Trichomonas vaginosis: NEGATIVE

## 2018-08-05 ENCOUNTER — Ambulatory Visit (INDEPENDENT_AMBULATORY_CARE_PROVIDER_SITE_OTHER): Payer: 59 | Admitting: *Deleted

## 2018-08-05 DIAGNOSIS — J309 Allergic rhinitis, unspecified: Secondary | ICD-10-CM | POA: Diagnosis not present

## 2018-08-08 ENCOUNTER — Encounter: Payer: Self-pay | Admitting: Obstetrics & Gynecology

## 2018-08-11 ENCOUNTER — Telehealth: Payer: Self-pay | Admitting: Obstetrics & Gynecology

## 2018-08-11 NOTE — Telephone Encounter (Signed)
Patient sent the following correspondence through [place]. Routing to triage to assist patient with request.  I forgot to ask you about the HPV vaccine. Can I get it? We talked about it before my surgery and I completely forgot to ask you about it each time since then.  Whenever you have time, no rush.

## 2018-08-11 NOTE — Telephone Encounter (Signed)
Routing to Dr. Miller to review and advise.  

## 2018-08-13 ENCOUNTER — Ambulatory Visit (INDEPENDENT_AMBULATORY_CARE_PROVIDER_SITE_OTHER): Payer: 59 | Admitting: *Deleted

## 2018-08-13 DIAGNOSIS — J309 Allergic rhinitis, unspecified: Secondary | ICD-10-CM | POA: Diagnosis not present

## 2018-08-14 NOTE — Telephone Encounter (Signed)
Yes, she could get the vaccine.  Gardisil 9 now, in two months and again in 6 months.  She will complete all of this before turning 44 so she can start whenever.  I have not entered orders.  Thanks.

## 2018-08-15 NOTE — Telephone Encounter (Signed)
Responded to patient via mychart.  Encounter closed.

## 2018-08-18 ENCOUNTER — Other Ambulatory Visit: Payer: Self-pay

## 2018-08-18 ENCOUNTER — Ambulatory Visit (INDEPENDENT_AMBULATORY_CARE_PROVIDER_SITE_OTHER): Payer: 59 | Admitting: Obstetrics & Gynecology

## 2018-08-18 VITALS — BP 124/78 | HR 78 | Resp 14 | Ht 65.5 in | Wt 156.4 lb

## 2018-08-18 DIAGNOSIS — Z23 Encounter for immunization: Secondary | ICD-10-CM

## 2018-08-18 NOTE — Progress Notes (Signed)
Patient in today for 1st Gardasil injection.   Contraception: Hysterectomy LMP: Hysterectomy Last AEX: 12-06-17 with Dr. Sabra Heck  Injection given in left deltoid. Patient tolerated shot well.   Patient informed next injection due in about 2 months. (after 10-17-2018)  Advised patient, if not on birth control, to return for next injection with cycle.   Routed to provider for final review.  Encounter closed.

## 2018-08-20 ENCOUNTER — Ambulatory Visit (INDEPENDENT_AMBULATORY_CARE_PROVIDER_SITE_OTHER): Payer: 59 | Admitting: *Deleted

## 2018-08-20 DIAGNOSIS — J309 Allergic rhinitis, unspecified: Secondary | ICD-10-CM | POA: Diagnosis not present

## 2018-08-28 ENCOUNTER — Ambulatory Visit (INDEPENDENT_AMBULATORY_CARE_PROVIDER_SITE_OTHER): Payer: 59

## 2018-08-28 DIAGNOSIS — J309 Allergic rhinitis, unspecified: Secondary | ICD-10-CM | POA: Diagnosis not present

## 2018-09-10 ENCOUNTER — Ambulatory Visit (INDEPENDENT_AMBULATORY_CARE_PROVIDER_SITE_OTHER): Payer: 59 | Admitting: *Deleted

## 2018-09-10 DIAGNOSIS — J309 Allergic rhinitis, unspecified: Secondary | ICD-10-CM | POA: Diagnosis not present

## 2018-09-24 ENCOUNTER — Other Ambulatory Visit: Payer: Self-pay

## 2018-09-24 ENCOUNTER — Ambulatory Visit (INDEPENDENT_AMBULATORY_CARE_PROVIDER_SITE_OTHER): Payer: 59

## 2018-09-24 DIAGNOSIS — J309 Allergic rhinitis, unspecified: Secondary | ICD-10-CM | POA: Diagnosis not present

## 2018-09-27 ENCOUNTER — Other Ambulatory Visit: Payer: Self-pay | Admitting: Allergy and Immunology

## 2018-09-30 ENCOUNTER — Telehealth: Payer: Self-pay

## 2018-09-30 ENCOUNTER — Ambulatory Visit (INDEPENDENT_AMBULATORY_CARE_PROVIDER_SITE_OTHER): Payer: 59 | Admitting: *Deleted

## 2018-09-30 DIAGNOSIS — J309 Allergic rhinitis, unspecified: Secondary | ICD-10-CM

## 2018-09-30 MED ORDER — ALBUTEROL SULFATE HFA 108 (90 BASE) MCG/ACT IN AERS: 2.0000 | INHALATION_SPRAY | RESPIRATORY_TRACT | 1 refills | Status: DC | PRN

## 2018-09-30 MED ORDER — FLUTICASONE PROPIONATE 93 MCG/ACT NA EXHU: 2.0000 | INHALANT_SUSPENSION | Freq: Two times a day (BID) | NASAL | 12 refills | Status: DC

## 2018-09-30 MED ORDER — FLUTICASONE PROPIONATE HFA 110 MCG/ACT IN AERO: 1.0000 | INHALATION_SPRAY | Freq: Two times a day (BID) | RESPIRATORY_TRACT | 5 refills | Status: DC

## 2018-09-30 MED ORDER — AZELASTINE HCL 137 MCG/SPRAY NA SOLN: 2.0000 | Freq: Two times a day (BID) | NASAL | 5 refills | Status: DC

## 2018-09-30 NOTE — Telephone Encounter (Signed)
Patient called and moved her appt to Dr Barnett Hatter first available in June. She is on allergy shots and she is wondering do she just keep proceeding with them as normal?   She needs refills on : Azelastine Flovent Albuterol Xhance   [facility]

## 2018-09-30 NOTE — Telephone Encounter (Signed)
Prescriptions have been sent in. Called patient and informed that when she gets to .29mL on her 3rd vial she will then be every 2 weeks at .5mL. Pt verbalized understanding.

## 2018-10-03 ENCOUNTER — Telehealth: Payer: Self-pay | Admitting: Allergy and Immunology

## 2018-10-03 MED ORDER — OLOPATADINE HCL 0.2 % OP SOLN: 1.0000 [drp] | Freq: Every day | OPHTHALMIC | 5 refills | Status: DC | PRN

## 2018-10-03 NOTE — Telephone Encounter (Signed)
Please disregard last message about PA, there was some confusion. Called patient and informed of incident.

## 2018-10-03 NOTE — Telephone Encounter (Signed)
Patient usually gets her Truett Perna from [facility] and she said she got a text from [place]. I told her it ships from [place]. She would like to talk about this.

## 2018-10-03 NOTE — Telephone Encounter (Signed)
Called and spoke with patient about [facility] and [facility]. Advised that we are still waiting for PA approval or denial for [facility]. Patient verbalized understanding.

## 2018-10-06 ENCOUNTER — Ambulatory Visit: Payer: 59 | Admitting: Allergy and Immunology

## 2018-10-07 ENCOUNTER — Ambulatory Visit (INDEPENDENT_AMBULATORY_CARE_PROVIDER_SITE_OTHER): Payer: 59 | Admitting: *Deleted

## 2018-10-07 DIAGNOSIS — J309 Allergic rhinitis, unspecified: Secondary | ICD-10-CM | POA: Diagnosis not present

## 2018-10-15 ENCOUNTER — Encounter: Payer: Self-pay | Admitting: *Deleted

## 2018-10-15 ENCOUNTER — Ambulatory Visit (INDEPENDENT_AMBULATORY_CARE_PROVIDER_SITE_OTHER): Payer: 59

## 2018-10-15 DIAGNOSIS — J309 Allergic rhinitis, unspecified: Secondary | ICD-10-CM

## 2018-10-21 ENCOUNTER — Ambulatory Visit: Payer: 59

## 2018-10-23 ENCOUNTER — Ambulatory Visit (INDEPENDENT_AMBULATORY_CARE_PROVIDER_SITE_OTHER): Payer: 59 | Admitting: *Deleted

## 2018-10-23 DIAGNOSIS — J309 Allergic rhinitis, unspecified: Secondary | ICD-10-CM

## 2018-10-28 NOTE — Progress Notes (Signed)
VIALS EXP 10-28-2019

## 2018-10-29 DIAGNOSIS — J301 Allergic rhinitis due to pollen: Secondary | ICD-10-CM | POA: Diagnosis not present

## 2018-10-31 ENCOUNTER — Ambulatory Visit (INDEPENDENT_AMBULATORY_CARE_PROVIDER_SITE_OTHER): Payer: 59 | Admitting: *Deleted

## 2018-10-31 DIAGNOSIS — J309 Allergic rhinitis, unspecified: Secondary | ICD-10-CM

## 2018-11-05 ENCOUNTER — Other Ambulatory Visit: Payer: Self-pay | Admitting: *Deleted

## 2018-11-05 MED ORDER — FEXOFENADINE-PSEUDOEPHED ER 60-120 MG PO TB12: 1.0000 | ORAL_TABLET | Freq: Two times a day (BID) | ORAL | 2 refills | Status: DC

## 2018-11-10 ENCOUNTER — Ambulatory Visit (INDEPENDENT_AMBULATORY_CARE_PROVIDER_SITE_OTHER): Payer: 59

## 2018-11-10 DIAGNOSIS — J309 Allergic rhinitis, unspecified: Secondary | ICD-10-CM | POA: Diagnosis not present

## 2018-11-17 ENCOUNTER — Ambulatory Visit (INDEPENDENT_AMBULATORY_CARE_PROVIDER_SITE_OTHER): Payer: 59

## 2018-11-17 ENCOUNTER — Ambulatory Visit: Payer: 59

## 2018-11-17 DIAGNOSIS — J309 Allergic rhinitis, unspecified: Secondary | ICD-10-CM

## 2018-11-25 ENCOUNTER — Ambulatory Visit (INDEPENDENT_AMBULATORY_CARE_PROVIDER_SITE_OTHER): Payer: 59

## 2018-11-25 DIAGNOSIS — J309 Allergic rhinitis, unspecified: Secondary | ICD-10-CM | POA: Diagnosis not present

## 2018-12-04 ENCOUNTER — Other Ambulatory Visit: Payer: Self-pay

## 2018-12-04 ENCOUNTER — Ambulatory Visit (INDEPENDENT_AMBULATORY_CARE_PROVIDER_SITE_OTHER): Payer: 59

## 2018-12-04 DIAGNOSIS — J309 Allergic rhinitis, unspecified: Secondary | ICD-10-CM

## 2018-12-08 ENCOUNTER — Encounter: Payer: Self-pay | Admitting: Obstetrics & Gynecology

## 2018-12-08 ENCOUNTER — Other Ambulatory Visit (HOSPITAL_COMMUNITY)
Admission: RE | Admit: 2018-12-08 | Discharge: 2018-12-08 | Disposition: A | Discharge status: Home / Self Care | Payer: 59 | Source: Ambulatory Visit | Attending: Obstetrics & Gynecology | Admitting: Obstetrics & Gynecology

## 2018-12-08 ENCOUNTER — Telehealth: Payer: Self-pay | Admitting: *Deleted

## 2018-12-08 ENCOUNTER — Other Ambulatory Visit: Payer: Self-pay

## 2018-12-08 ENCOUNTER — Ambulatory Visit: Payer: 59 | Admitting: Obstetrics & Gynecology

## 2018-12-08 VITALS — BP 108/80 | HR 84 | Temp 97.2°F | Ht 66.0 in | Wt 150.6 lb

## 2018-12-08 DIAGNOSIS — Z124 Encounter for screening for malignant neoplasm of cervix: Secondary | ICD-10-CM | POA: Diagnosis not present

## 2018-12-08 DIAGNOSIS — Z23 Encounter for immunization: Secondary | ICD-10-CM

## 2018-12-08 DIAGNOSIS — Z202 Contact with and (suspected) exposure to infections with a predominantly sexual mode of transmission: Secondary | ICD-10-CM | POA: Diagnosis not present

## 2018-12-08 DIAGNOSIS — Z01419 Encounter for gynecological examination (general) (routine) without abnormal findings: Secondary | ICD-10-CM | POA: Diagnosis not present

## 2018-12-08 DIAGNOSIS — N898 Other specified noninflammatory disorders of vagina: Secondary | ICD-10-CM | POA: Diagnosis not present

## 2018-12-08 MED ORDER — FLUCONAZOLE 150 MG PO TABS: 150.0000 mg | ORAL_TABLET | Freq: Once | ORAL | 0 refills | Status: AC

## 2018-12-08 NOTE — Telephone Encounter (Signed)
Dr. Assunta Curtis review and advise on Hep B vaccine.

## 2018-12-08 NOTE — Addendum Note (Signed)
Addended by: Polly Cobia on: 12/08/2018 02:26 PM   Modules accepted: Orders

## 2018-12-08 NOTE — Progress Notes (Signed)
43 y.o. G2P1 Divorced White or Caucasian female here for annual exam.  Doing well.  She's been working out a lot and has lost about 10 pounds.    Denies vaginal bleeding.    PCP:  Dr. Rogelia Mire.  Had blood work done last week.    No LMP recorded (lmp unknown). Patient has had a hysterectomy.          Sexually active: Yes.    The current method of family planning is status post hysterectomy.    Exercising: Yes.    cardio, weight training  Smoker:  no  Health Maintenance: Pap:  12/06/17 LSIL. Colpo: Borderline CIN1  11/30/16 ASCUS. HR HPV:neg  History of abnormal Pap:  yes MMG:  05/07/18 BIRADS1:Neg  TDaP:  2014 Screening Labs: PCP   reports that she has never smoked. She has never used smokeless tobacco. She reports current alcohol use of about 3.0 standard drinks of alcohol per week. She reports that she does not use drugs.  Past Medical History:  Diagnosis Date  . Abnormal Pap smear    history of colposcopy ECC with atypical squamous metaplasia  . Anxiety   . Asthma   . Bilateral hearing loss    hearing aid in right ear and cochlear implant in left ear  . Bursitis of shoulder, right   . Congenital hearing loss    started age 49 -   . Depression    no meds  . Endometriosis 10/10  . Headache    otc med prn  . Hx of migraines    increased frequency with estrogen containing OCPs  . IUD (intrauterine device) in place 08/2017   Mirena  . Panic attacks   . Recurrent upper respiratory infection (URI)   . Seasonal allergies   . SVD (spontaneous vaginal delivery)    x 1    Past Surgical History:  Procedure Laterality Date  . BREAST REDUCTION SURGERY  9/10  . COCHLEAR IMPLANT Left 2014   moval of Envoy device  . CYSTOSCOPY N/A 04/01/2018   Procedure: CYSTOSCOPY;  Surgeon: Megan Salon, MD;  Location: [facility];  Service: Gynecology;  Laterality: N/A;  . IMPLANTATION OF ENVOY ESTEEM HEARING DEVICE     for hearing loss - now has been removed and replaced with cochlear  implant 2014  . IUD REMOVAL N/A 04/01/2018   Procedure: INTRAUTERINE DEVICE (IUD) REMOVAL  possible;  Surgeon: Megan Salon, MD;  Location: [facility];  Service: Gynecology;  Laterality: N/A;  possible  . TOTAL LAPAROSCOPIC HYSTERECTOMY WITH SALPINGECTOMY Bilateral 04/01/2018   Procedure: TOTAL LAPAROSCOPIC HYSTERECTOMY WITH SALPINGECTOMY WITH EXCISION OF ENDOMETRIOSIS;  Surgeon: Megan Salon, MD;  Location: [facility];  Service: Gynecology;  Laterality: Bilateral;  . TUBAL LIGATION  10/10   cautery of endometriosis  . WISDOM TOOTH EXTRACTION      Current Outpatient Medications  Medication Sig Dispense Refill  . albuterol (VENTOLIN HFA) 108 (90 Base) MCG/ACT inhaler Inhale 2 puffs into the lungs every 4 (four) hours as needed for wheezing or shortness of breath. 1 Inhaler 1  . ALPRAZolam (XANAX) 0.5 MG tablet Take 1 tablet (0.5 mg total) by mouth 2 (two) times daily as needed for anxiety. 30 tablet 0  . Ascorbic Acid (VITAMIN C) 1000 MG tablet     . Azelastine HCl 137 MCG/SPRAY SOLN Place 2 sprays into both nostrils 2 (two) times daily. 30 mL 5  . Calcium Carbonate (CALCIUM 600 PO) Take 1 tablet by mouth daily.    Marland Kitchen  Cholecalciferol (VITAMIN D) 2000 units tablet Take 2,000 Units by mouth daily.     . Cyanocobalamin (B-12) 1000 MCG CAPS     . ELDERBERRY PO Take 1 tablet by mouth daily as needed (for cold).     . fexofenadine-pseudoephedrine (ALLEGRA-D ALLERGY & CONGESTION) 60-120 MG 12 hr tablet Take 1 tablet by mouth 2 (two) times daily. 60 tablet 2  . fluticasone (FLOVENT HFA) 110 MCG/ACT inhaler Inhale 1 puff into the lungs 2 (two) times daily. 12 g 5  . folic acid (FOLVITE) 604 MCG tablet     . Magnesium 250 MG TABS     . Multiple Vitamins-Minerals (MULTIVITAMIN GUMMIES ADULT PO) Take 1 each by mouth daily. Super food    . Olopatadine HCl 0.2 % SOLN Apply 1 drop to eye daily as needed. 1 Bottle 5  . Probiotic Product (PROBIOTIC-10) CHEW Chew 1 each by mouth daily.     . promethazine  (PHENERGAN) 12.5 MG tablet 1-2 tablets every six hours as needed for nausea 30 tablet 0   No current facility-administered medications for this visit.     Family History  Problem Relation Age of Onset  . Deafness Mother   . Diabetes Mother   . Heart Problems Mother        tachycardia  . Ovarian cancer Mother 14  . Deafness Father   . Hypertension Father   . Cirrhosis Father        non alcoholic  . COPD Father   . Hyperlipidemia Father   . Down syndrome Sister   . Diabetes Paternal Grandmother   . Allergic rhinitis Neg Hx   . Angioedema Neg Hx   . Asthma Neg Hx   . Eczema Neg Hx   . Immunodeficiency Neg Hx   . Urticaria Neg Hx     Review of Systems  Genitourinary:       Vaginal itching   All other systems reviewed and are negative.   Exam:   BP 108/80   Pulse 84   Temp (!) 97.2 F (36.2 C) (Temporal)   Ht 5\' 6"  (1.676 m)   Wt 150 lb 9.6 oz (68.3 kg)   LMP  (LMP Unknown)   BMI 24.31 kg/m     Height: 5\' 6"  (167.6 cm)  Ht Readings from Last 3 Encounters:  12/08/18 5\' 6"  (1.676 m)  08/18/18 5' 5.5" (1.664 m)  07/25/18 5' 5.5" (1.664 m)    General appearance: alert, cooperative and appears stated age Head: Normocephalic, without obvious abnormality, atraumatic Neck: no adenopathy, supple, symmetrical, trachea midline and thyroid normal to inspection and palpation Lungs: clear to auscultation bilaterally Breasts: normal appearance, no masses or tenderness Heart: regular rate and rhythm Abdomen: soft, non-tender; bowel sounds normal; no masses,  no organomegaly Extremities: extremities normal, atraumatic, no cyanosis or edema Skin: Skin color, texture, turgor normal. No rashes or lesions Lymph nodes: Cervical, supraclavicular, and axillary nodes normal. No abnormal inguinal nodes palpated Neurologic: Grossly normal   Pelvic: External genitalia:  no lesions              Urethra:  normal appearing urethra with no masses, tenderness or lesions               Bartholins and Skenes: normal                 Vagina: normal appearing vagina with normal color and discharge, no lesions              Cervix:  absent              Pap taken: Yes.   Bimanual Exam:  Uterus:  uterus absent              Adnexa: no mass, fullness, tenderness               Rectovaginal: Confirms               Anus:  normal sphincter tone, no lesions  Chaperone was present for exam.  A:  Well Woman with normal exam S/p TLh/bilateral salpingectomy/cystoscopy 9/19 Family hx of ovarian cancer in her mother (deceased) Vaginal discharge Desires STD testing H/o endometriosis diagnosed with BTL 101/10  P:   Mammogram guidelines reviewed.  Doing yearly. pap smear with HR HPV obtained today Affirm pending Diflucan 150mg  po x 1, repeat 72 hours to pharmacy HIV, RPR, hep B/C and GC/CHl trich testing obtained today #2 Gardisil will be given today.  Repeat in 4 months. Consider PUS next year (last done 9/19) Return annually or prn

## 2018-12-08 NOTE — Addendum Note (Signed)
Addended by: Megan Salon on: 12/08/2018 03:04 PM   Modules accepted: Orders

## 2018-12-08 NOTE — Telephone Encounter (Signed)
I forgot to ask when I was there.  I did not go away to college so there are vaccines I did not get. Would it benefit me to get the Hep B vaccine? I did not get that one. I meant to ask today but completely forgot!     Just thought I would ask considering I just got my 2nd dose of gardasil.     No hurry to answer. It can wait until my labs come back.   Thanks!  [place]

## 2018-12-09 LAB — HEP, RPR, HIV PANEL
HIV Screen 4th Generation wRfx: NONREACTIVE
Hepatitis B Surface Ag: NEGATIVE
RPR Ser Ql: REACTIVE — AB

## 2018-12-09 LAB — RPR, QUANT. (REFLEX): Rapid Plasma Reagin, Quant: 1:1 {titer} — ABNORMAL HIGH

## 2018-12-09 LAB — HEPATITIS C ANTIBODY: Hep C Virus Ab: <0.1 s/co ratio (ref 0.0–0.9)

## 2018-12-09 NOTE — Telephone Encounter (Signed)
Yes, it is ok for her to get this vaccination.  We do not have it in the office so will need to do at the health department or with the infectious disease clinic with [facility].  If she has not done the Hep A vaccination, then she can do both as there is a combination vaccination.  Please let her know.  Thanks.

## 2018-12-09 NOTE — Telephone Encounter (Signed)
Spoke with patient, advised as seen below per Dr. Miller. Patient verbalizes understanding and is agreeable.   Encounter closed.  

## 2018-12-11 ENCOUNTER — Encounter: Payer: Self-pay | Admitting: Obstetrics & Gynecology

## 2018-12-11 LAB — CYTOLOGY - PAP
Chlamydia: NEGATIVE
Diagnosis: NEGATIVE
HPV: NOT DETECTED
Neisseria Gonorrhea: NEGATIVE
Trichomonas: NEGATIVE

## 2018-12-12 ENCOUNTER — Encounter: Payer: Self-pay | Admitting: Obstetrics & Gynecology

## 2018-12-13 LAB — NUSWAB BV AND CANDIDA, NAA
Candida albicans, NAA: NEGATIVE
Candida glabrata, NAA: NEGATIVE

## 2018-12-15 ENCOUNTER — Ambulatory Visit: Payer: 59 | Admitting: Allergy and Immunology

## 2018-12-15 ENCOUNTER — Encounter: Payer: Self-pay | Admitting: Allergy and Immunology

## 2018-12-15 ENCOUNTER — Other Ambulatory Visit: Payer: Self-pay

## 2018-12-15 ENCOUNTER — Ambulatory Visit: Payer: Self-pay

## 2018-12-15 VITALS — BP 112/64 | HR 68 | Temp 98.5°F | Resp 16

## 2018-12-15 DIAGNOSIS — J4530 Mild persistent asthma, uncomplicated: Secondary | ICD-10-CM

## 2018-12-15 DIAGNOSIS — J3089 Other allergic rhinitis: Secondary | ICD-10-CM | POA: Diagnosis not present

## 2018-12-15 DIAGNOSIS — J309 Allergic rhinitis, unspecified: Secondary | ICD-10-CM

## 2018-12-15 DIAGNOSIS — H1013 Acute atopic conjunctivitis, bilateral: Secondary | ICD-10-CM | POA: Diagnosis not present

## 2018-12-15 MED ORDER — FLUTICASONE PROPIONATE HFA 110 MCG/ACT IN AERO: 1.0000 | INHALATION_SPRAY | Freq: Two times a day (BID) | RESPIRATORY_TRACT | 5 refills | Status: DC

## 2018-12-15 MED ORDER — FEXOFENADINE HCL 180 MG PO TABS: 180.0000 mg | ORAL_TABLET | Freq: Every day | ORAL | 5 refills | Status: DC

## 2018-12-15 MED ORDER — OLOPATADINE HCL 0.2 % OP SOLN: 1.0000 [drp] | Freq: Every day | OPHTHALMIC | 5 refills | Status: DC | PRN

## 2018-12-15 MED ORDER — AZELASTINE HCL 137 MCG/SPRAY NA SOLN: 2.0000 | Freq: Two times a day (BID) | NASAL | 5 refills | Status: DC

## 2018-12-15 NOTE — Assessment & Plan Note (Signed)
Stable.  Continue appropriate allergen avoidance measures, immunotherapy injections as prescribed, Xhance daily, and nasal saline spray as needed and prior to medicated nasal sprays.

## 2018-12-15 NOTE — Patient Instructions (Addendum)
Mild persistent asthma  For now, continue Flovent 110 g, 2 inhalations via spacer device twice daily, and albuterol HFA, 1 to 2 inhalations every 4-6 hours if needed.  During respiratory tract infections or asthma flares, increase Flovent 110g to 3 inhalations 3 times per day until symptoms have returned to baseline.  Subjective and objective measures of pulmonary function will be followed and the treatment plan will be adjusted accordingly.  Allergic rhinitis Stable.  Continue appropriate allergen avoidance measures, immunotherapy injections as prescribed, Xhance daily, and nasal saline spray as needed and prior to medicated nasal sprays.    Return in about 5 months (around 05/17/2019), or if symptoms worsen or fail to improve.

## 2018-12-15 NOTE — Assessment & Plan Note (Signed)
   For now, continue Flovent 110 g, 2 inhalations via spacer device twice daily, and albuterol HFA, 1 to 2 inhalations every 4-6 hours if needed.  During respiratory tract infections or asthma flares, increase Flovent 110g to 3 inhalations 3 times per day until symptoms have returned to baseline.  Subjective and objective measures of pulmonary function will be followed and the treatment plan will be adjusted accordingly. 

## 2018-12-15 NOTE — Progress Notes (Signed)
Follow-up Note  RE: Laurian Edrington MRN: [id number] DOB: 1976/01/09 Date of Office Visit: 12/15/2018  Primary care provider: [name], MD Referring provider: [name], MD  History of present illness: Margaret Ford is a 43 y.o. female with persistent asthma, allergic rhinitis, and history of food allergy presenting today for follow-up.  She was last seen in this clinic in September 2019.  She reports that in the interval since her previous visit her asthma has been well controlled.  She rarely requires albuterol rescue and does not experience limitations in normal daily activities or nocturnal awakenings due to lower respiratory symptoms.  She currently takes Flovent 110 g, 2 inhalations via spacer device twice daily.  She reports that symptoms of rhinoconjunctivitis are well controlled.  She is receiving immunotherapy maintenance injections without problems or complications.  Assessment and plan: Mild persistent asthma  For now, continue Flovent 110 g, 2 inhalations via spacer device twice daily, and albuterol HFA, 1 to 2 inhalations every 4-6 hours if needed.  During respiratory tract infections or asthma flares, increase Flovent 110g to 3 inhalations 3 times per day until symptoms have returned to baseline.  Subjective and objective measures of pulmonary function will be followed and the treatment plan will be adjusted accordingly.  Allergic rhinitis Stable.  Continue appropriate allergen avoidance measures, immunotherapy injections as prescribed, Xhance daily, and nasal saline spray as needed and prior to medicated nasal sprays.    Meds ordered this encounter  Medications  . fexofenadine (ALLEGRA) 180 MG tablet    Sig: Take 1 tablet (180 mg total) by mouth daily.    Dispense:  30 tablet    Refill:  5  . fluticasone (FLOVENT HFA) 110 MCG/ACT inhaler    Sig: Inhale 1 puff into the lungs 2 (two) times daily.    Dispense:  12 g    Refill:  5   . Olopatadine HCl 0.2 % SOLN    Sig: Apply 1 drop to eye daily as needed.    Dispense:  1 Bottle    Refill:  5  . Azelastine HCl 137 MCG/SPRAY SOLN    Sig: Place 2 sprays into both nostrils 2 (two) times daily.    Dispense:  30 mL    Refill:  5    Physical examination: Blood pressure 112/64, pulse 68, temperature 98.5 F (36.9 C), resp. rate 16, SpO2 98 %.  General: Alert, interactive, in no acute distress. HEENT: TMs pearly gray, turbinates minimally edematous without discharge, post-pharynx unremarkable. Neck: Supple without lymphadenopathy. Lungs: Clear to auscultation without wheezing, rhonchi or rales. CV: Normal S1, S2 without murmurs. Skin: Warm and dry, without lesions or rashes.  The following portions of the patient's history were reviewed and updated as appropriate: allergies, current medications, past family history, past medical history, past social history, past surgical history and problem list.  Allergies as of 12/15/2018      Reactions   Augmentin [amoxicillin-pot Clavulanate] Diarrhea   Ciprofloxacin Diarrhea   Codeine Other (See Comments)   "sick" - ok to take with nausea med   Shrimp [shellfish Allergy]    Sulfa Antibiotics Other (See Comments)   Generalized burning      Medication List       Accurate as of December 15, 2018  6:15 PM. If you have any questions, ask your nurse or doctor.        albuterol 108 (90 Base) MCG/ACT inhaler Commonly known as:  Ventolin HFA Inhale 2 puffs into the  lungs every 4 (four) hours as needed for wheezing or shortness of breath.   ALPRAZolam 0.5 MG tablet Commonly known as:  XANAX Take 1 tablet (0.5 mg total) by mouth 2 (two) times daily as needed for anxiety.   Azelastine HCl 137 MCG/SPRAY Soln Place 2 sprays into both nostrils 2 (two) times daily.   B-12 1000 MCG Caps   CALCIUM 600 PO Take 1 tablet by mouth daily.   ELDERBERRY PO Take 1 tablet by mouth daily as needed (for cold).   fexofenadine 180 MG tablet  Commonly known as:  ALLEGRA Take 1 tablet (180 mg total) by mouth daily. Started by:  Edmonia Lynch, MD   fexofenadine-pseudoephedrine 60-120 MG 12 hr tablet Commonly known as:  Allegra-D Allergy & Congestion Take 1 tablet by mouth 2 (two) times daily.   fluticasone 110 MCG/ACT inhaler Commonly known as:  Flovent HFA Inhale 1 puff into the lungs 2 (two) times daily.   folic acid 810 MCG tablet Commonly known as:  FOLVITE   Magnesium 250 MG Tabs   MULTIVITAMIN GUMMIES ADULT PO Take 1 each by mouth daily. Super food   Olopatadine HCl 0.2 % Soln Apply 1 drop to eye daily as needed.   Probiotic-10 Chew Chew 1 each by mouth daily.   promethazine 12.5 MG tablet Commonly known as:  PHENERGAN 1-2 tablets every six hours as needed for nausea   vitamin C 1000 MG tablet   Vitamin D 50 MCG (2000 UT) tablet Take 2,000 Units by mouth daily.   Xhance 93 MCG/ACT Exhu Generic drug:  Fluticasone Propionate Place 2 sprays into the nose 2 (two) times a day.       Allergies  Allergen Reactions  . Augmentin [Amoxicillin-Pot Clavulanate] Diarrhea  . Ciprofloxacin Diarrhea  . Codeine Other (See Comments)    "sick" - ok to take with nausea med  . Shrimp [Shellfish Allergy]   . Sulfa Antibiotics Other (See Comments)    Generalized burning    I appreciate the opportunity to take part in Dajanay's care. Please do not hesitate to contact me with questions.  Sincerely,   R. Edgar Frisk, MD

## 2018-12-18 ENCOUNTER — Telehealth: Payer: Self-pay | Admitting: *Deleted

## 2018-12-18 ENCOUNTER — Encounter: Payer: Self-pay | Admitting: Obstetrics & Gynecology

## 2018-12-18 NOTE — Telephone Encounter (Signed)
See telephone encounter dated 12/18/18.

## 2018-12-18 NOTE — Telephone Encounter (Signed)
Visit Follow-Up Question Message [phone] From  [facility] To  Megan Salon, MD Sent  12/12/2018 8:27 PM  So I was able to get in today and retested. STAT test came up negative.  They feel like I got a false positive the 1st time, examined again.  So will know for sure when the next test comes back next week.  She really felt like I don't have it so didn't give me meds today.    Just letting you know, thanks!

## 2018-12-18 NOTE — Telephone Encounter (Signed)
Routing to Dr. Sabra Heck.     From  [facility] To  Megan Salon, MD Sent  12/18/2018 2:42 PM  Final results came in and were NEGATIVE!  YAY!  I asked her to send Dr Sabra Heck the results.    Thank you!

## 2018-12-18 NOTE — Telephone Encounter (Signed)
Spoke with patient in f/u to MyChart message seen below. Patient states she was seen at [facility] on 12/11/17, rapid test for syphilis was negative. Pelvic exam was completed, they discussed possibility of "false positive",  additional testing was ordered, still awaiting results. Wants Dr. Sabra Heck to know [facility]  testing was negative. Patient states she will notify Dr. Sabra Heck by MyChart message when she receives her results. Advised I will update Dr. Sabra Heck and return call if any additional recommendations. Patient verbalizes understanding.   Dr. Sabra Heck -please review, any additional f/u needed at this time?

## 2018-12-19 ENCOUNTER — Encounter: Payer: Self-pay | Admitting: Obstetrics & Gynecology

## 2018-12-30 ENCOUNTER — Ambulatory Visit (INDEPENDENT_AMBULATORY_CARE_PROVIDER_SITE_OTHER): Payer: 59 | Admitting: *Deleted

## 2018-12-30 DIAGNOSIS — J309 Allergic rhinitis, unspecified: Secondary | ICD-10-CM | POA: Diagnosis not present

## 2019-01-14 DIAGNOSIS — J301 Allergic rhinitis due to pollen: Secondary | ICD-10-CM | POA: Diagnosis not present

## 2019-01-14 NOTE — Progress Notes (Signed)
VIALS EXP 01-14-2020

## 2019-01-16 ENCOUNTER — Ambulatory Visit (INDEPENDENT_AMBULATORY_CARE_PROVIDER_SITE_OTHER): Payer: 59 | Admitting: *Deleted

## 2019-01-16 DIAGNOSIS — J309 Allergic rhinitis, unspecified: Secondary | ICD-10-CM

## 2019-01-30 ENCOUNTER — Ambulatory Visit (INDEPENDENT_AMBULATORY_CARE_PROVIDER_SITE_OTHER): Payer: 59 | Admitting: *Deleted

## 2019-01-30 DIAGNOSIS — J309 Allergic rhinitis, unspecified: Secondary | ICD-10-CM

## 2019-02-12 ENCOUNTER — Ambulatory Visit (INDEPENDENT_AMBULATORY_CARE_PROVIDER_SITE_OTHER): Payer: 59 | Admitting: *Deleted

## 2019-02-12 DIAGNOSIS — J309 Allergic rhinitis, unspecified: Secondary | ICD-10-CM

## 2019-02-13 ENCOUNTER — Telehealth: Payer: Self-pay | Admitting: *Deleted

## 2019-02-13 NOTE — Telephone Encounter (Signed)
Patient came in for allergy injection and states that since she has switched from [place] D to Claritin there is still no improvement with her allergies. She states that since the switch she has lost her sense of taste and smell. She denies any other symptoms of Covid and had negative antibodies back in June. She believes that this is allergy related. Please advise.

## 2019-02-16 NOTE — Telephone Encounter (Signed)
Have her use the Xhance 2 sprays per nostril twice daily (with nasal saline prior).  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  Loratadine is a week antihistamine.  She should probably switch back and forth between levocetirizine and fexofenadine. Thanks.

## 2019-02-17 NOTE — Telephone Encounter (Signed)
Patient called back and treatment plan was relayed. Patient verbalized understanding and will abide by treatment plan. Patient will call back if symptoms do not improve.

## 2019-02-17 NOTE — Telephone Encounter (Signed)
Called and left voicemail asking patient to return call to discuss.

## 2019-02-20 ENCOUNTER — Ambulatory Visit: Payer: 59

## 2019-02-24 ENCOUNTER — Ambulatory Visit (INDEPENDENT_AMBULATORY_CARE_PROVIDER_SITE_OTHER): Payer: 59 | Admitting: *Deleted

## 2019-02-24 DIAGNOSIS — J309 Allergic rhinitis, unspecified: Secondary | ICD-10-CM | POA: Diagnosis not present

## 2019-02-25 ENCOUNTER — Other Ambulatory Visit: Payer: Self-pay

## 2019-02-25 ENCOUNTER — Telehealth: Payer: Self-pay

## 2019-02-25 NOTE — Telephone Encounter (Signed)
Margaret Ford completed for ProAir HFA 108 Pending

## 2019-02-26 ENCOUNTER — Ambulatory Visit (INDEPENDENT_AMBULATORY_CARE_PROVIDER_SITE_OTHER): Payer: 59

## 2019-02-26 VITALS — BP 116/70 | HR 80 | Temp 97.7°F | Ht 66.0 in | Wt 153.0 lb

## 2019-02-26 DIAGNOSIS — Z23 Encounter for immunization: Secondary | ICD-10-CM

## 2019-02-26 NOTE — Progress Notes (Signed)
Patient in today for 3rd Gardasil injection.   Contraception: Hysterectomy LMP: unknown Last AEX: 12/08/2018 with Dr. Sabra Heck.  Injection given in Right Deltoid. Patient tolerated shot well.   Pt has completed the HPV vaccination series.  Routed to provider for final review.  Encounter closed.

## 2019-03-06 NOTE — Telephone Encounter (Signed)
Prior auth did not show as having gone through on covermymeds. I did resubmit it.

## 2019-03-09 ENCOUNTER — Other Ambulatory Visit: Payer: Self-pay | Admitting: *Deleted

## 2019-03-09 MED ORDER — ALBUTEROL SULFATE HFA 108 (90 BASE) MCG/ACT IN AERS: 2.0000 | INHALATION_SPRAY | RESPIRATORY_TRACT | 1 refills | Status: DC | PRN

## 2019-03-11 ENCOUNTER — Ambulatory Visit (INDEPENDENT_AMBULATORY_CARE_PROVIDER_SITE_OTHER): Payer: 59 | Admitting: *Deleted

## 2019-03-11 ENCOUNTER — Telehealth: Payer: Self-pay

## 2019-03-11 DIAGNOSIS — J309 Allergic rhinitis, unspecified: Secondary | ICD-10-CM | POA: Diagnosis not present

## 2019-03-11 MED ORDER — FLOVENT HFA 110 MCG/ACT IN AERO: 1.0000 | INHALATION_SPRAY | Freq: Two times a day (BID) | RESPIRATORY_TRACT | 1 refills | Status: DC

## 2019-03-11 NOTE — Telephone Encounter (Signed)
PA for ProAir was denied. But Ventolin was sent to [facility] on 8/31.

## 2019-03-11 NOTE — Telephone Encounter (Signed)
Patient called and stated [facility] and they informed her they couldn't. She stated that [facility]2019 [facility] and when she asked why they couldn't transfer her Flovent, they stated that they do not make money on the albuterol but they do on the Flovent so they can not transfer it. Rx has been sent to [facility].

## 2019-03-12 ENCOUNTER — Ambulatory Visit: Payer: 59 | Admitting: Obstetrics & Gynecology

## 2019-03-12 ENCOUNTER — Encounter

## 2019-03-27 ENCOUNTER — Ambulatory Visit (INDEPENDENT_AMBULATORY_CARE_PROVIDER_SITE_OTHER): Payer: 59 | Admitting: *Deleted

## 2019-03-27 DIAGNOSIS — J309 Allergic rhinitis, unspecified: Secondary | ICD-10-CM | POA: Diagnosis not present

## 2019-04-10 ENCOUNTER — Ambulatory Visit (INDEPENDENT_AMBULATORY_CARE_PROVIDER_SITE_OTHER): Payer: 59 | Admitting: *Deleted

## 2019-04-10 ENCOUNTER — Other Ambulatory Visit: Payer: Self-pay

## 2019-04-10 DIAGNOSIS — J309 Allergic rhinitis, unspecified: Secondary | ICD-10-CM | POA: Diagnosis not present

## 2019-04-10 MED ORDER — XHANCE 93 MCG/ACT NA EXHU: 2.0000 | INHALANT_SUSPENSION | Freq: Every day | NASAL | 5 refills | Status: DC

## 2019-04-23 ENCOUNTER — Ambulatory Visit (INDEPENDENT_AMBULATORY_CARE_PROVIDER_SITE_OTHER): Payer: 59 | Admitting: *Deleted

## 2019-04-23 DIAGNOSIS — J309 Allergic rhinitis, unspecified: Secondary | ICD-10-CM | POA: Diagnosis not present

## 2019-05-08 ENCOUNTER — Ambulatory Visit (INDEPENDENT_AMBULATORY_CARE_PROVIDER_SITE_OTHER): Payer: 59 | Admitting: *Deleted

## 2019-05-08 DIAGNOSIS — J309 Allergic rhinitis, unspecified: Secondary | ICD-10-CM | POA: Diagnosis not present

## 2019-05-10 ENCOUNTER — Telehealth: Payer: 59 | Admitting: Physician Assistant

## 2019-05-10 DIAGNOSIS — N3000 Acute cystitis without hematuria: Secondary | ICD-10-CM | POA: Diagnosis not present

## 2019-05-10 MED ORDER — CEPHALEXIN 500 MG PO CAPS: 500.0000 mg | ORAL_CAPSULE | Freq: Two times a day (BID) | ORAL | 0 refills | Status: DC

## 2019-05-10 NOTE — Progress Notes (Signed)
We are sorry that you are not feeling well.  Here is how we plan to help!  Based on what you shared with me it looks like you most likely have a simple urinary tract infection.  A UTI (Urinary Tract Infection) is a bacterial infection of the bladder.  Most cases of urinary tract infections are simple to treat but a key part of your care is to encourage you to drink plenty of fluids and watch your symptoms carefully.  I have prescribed Keflex 500 mg twice a day for 5 days.  Your symptoms should gradually improve. Call us if the burning in your urine worsens, you develop worsening fever, back pain or pelvic pain or if your symptoms do not resolve after completing the antibiotic.  Urinary tract infections can be prevented by drinking plenty of water to keep your body hydrated.  Also be sure when you wipe, wipe from front to back and don't hold it in!  If possible, empty your bladder every 4 hours.  Your e-visit answers were reviewed by a board certified advanced clinical practitioner to complete your personal care plan.  Depending on the condition, your plan could have included both over the counter or prescription medications.  If there is a problem please reply  once you have received a response from your provider.  Your safety is important to us.  If you have drug allergies check your prescription carefully.    You can use MyChart to ask questions about today's visit, request a non-urgent call back, or ask for a work or school excuse for 24 hours related to this e-Visit. If it has been greater than 24 hours you will need to follow up with your provider, or enter a new e-Visit to address those concerns.   You will get an e-mail in the next two days asking about your experience.  I hope that your e-visit has been valuable and will speed your recovery. Thank you for using e-visits.   Greater than 5 minutes, yet less than 10 minutes of time have been spent researching, coordinating, and  implementing care for this patient today 

## 2019-05-20 ENCOUNTER — Ambulatory Visit (INDEPENDENT_AMBULATORY_CARE_PROVIDER_SITE_OTHER): Payer: 59 | Admitting: *Deleted

## 2019-05-20 DIAGNOSIS — J309 Allergic rhinitis, unspecified: Secondary | ICD-10-CM

## 2019-06-01 ENCOUNTER — Encounter: Payer: Self-pay | Admitting: Obstetrics & Gynecology

## 2019-06-08 ENCOUNTER — Ambulatory Visit (INDEPENDENT_AMBULATORY_CARE_PROVIDER_SITE_OTHER): Payer: 59 | Admitting: *Deleted

## 2019-06-08 DIAGNOSIS — J309 Allergic rhinitis, unspecified: Secondary | ICD-10-CM

## 2019-06-15 ENCOUNTER — Encounter: Payer: Self-pay | Admitting: Allergy and Immunology

## 2019-06-15 ENCOUNTER — Ambulatory Visit (INDEPENDENT_AMBULATORY_CARE_PROVIDER_SITE_OTHER): Payer: 59 | Admitting: Allergy and Immunology

## 2019-06-15 ENCOUNTER — Other Ambulatory Visit: Payer: Self-pay

## 2019-06-15 VITALS — BP 112/62 | HR 83 | Temp 97.9°F | Resp 16 | Ht 65.5 in | Wt 161.4 lb

## 2019-06-15 DIAGNOSIS — J3089 Other allergic rhinitis: Secondary | ICD-10-CM

## 2019-06-15 DIAGNOSIS — J309 Allergic rhinitis, unspecified: Secondary | ICD-10-CM

## 2019-06-15 DIAGNOSIS — J4530 Mild persistent asthma, uncomplicated: Secondary | ICD-10-CM | POA: Diagnosis not present

## 2019-06-15 MED ORDER — FLOVENT HFA 110 MCG/ACT IN AERO: 2.0000 | INHALATION_SPRAY | Freq: Two times a day (BID) | RESPIRATORY_TRACT | 1 refills | Status: DC

## 2019-06-15 MED ORDER — ALBUTEROL SULFATE HFA 108 (90 BASE) MCG/ACT IN AERS: 1.0000 | INHALATION_SPRAY | RESPIRATORY_TRACT | 0 refills | Status: DC | PRN

## 2019-06-15 NOTE — Progress Notes (Signed)
Follow-up Note  RE: [name] MRN: [id number] DOB: 06/15/1976 Date of Office Visit: 06/15/2019  Primary care provider: [name], MD Referring provider: [name], MD  History of present illness: Margaret Ford is a 43 y.o. female with persistent asthma and allergic rhinitis presenting today for follow-up.  She was last seen in this clinic on December 15, 2018.  She reports that while taking Flovent 110 g, 2 inhalations via spacer device twice daily, her asthma is well controlled.  She rarely requires albuterol rescue, typically with vigorous exercise, and does not experience limitations in normal daily activities or nocturnal awakenings due to lower respiratory symptoms.  She has noticed improvement in her rhinoconjunctivitis symptoms while on immunotherapy injections and has had no problems or complications with the injections.  She typically experiences significant rhinoconjunctivitis symptoms during the summer, however she had "no problems" this summer.  Assessment and plan: Mild persistent asthma  For now, continue Flovent 110 g, 2 inhalations via spacer device twice daily, and albuterol HFA, 1 to 2 inhalations every 4-6 hours if needed.  During respiratory tract infections or asthma flares, increase Flovent 110g to 3 inhalations 3 times per day until symptoms have returned to baseline.  Subjective and objective measures of pulmonary function will be followed and the treatment plan will be adjusted accordingly.  Allergic rhinitis Stable.  Continue appropriate allergen avoidance measures, immunotherapy injections as prescribed, levocetirizine (Xyzal) 5 mg daily as needed, Xhance daily, and nasal saline spray as needed and prior to medicated nasal sprays.   To avoid diminishing benefit with daily use (tachyphylaxis) of second generation antihistamine, consider alternating every few months between fexofenadine (Allegra) and levocetirizine (Xyzal).   Meds ordered this encounter  Medications  . albuterol (PROAIR HFA) 108 (90 Base) MCG/ACT inhaler    Sig: Inhale 1-2 puffs into the lungs every 4 (four) hours as needed for wheezing or shortness of breath.    Dispense:  54 g    Refill:  0  . fluticasone (FLOVENT HFA) 110 MCG/ACT inhaler    Sig: Inhale 2 puffs into the lungs 2 (two) times daily.    Dispense:  36 g    Refill:  1    Diagnostics: Due to COVID-19 precautions, spirometry was not performed today.    Physical examination: Blood pressure 112/62, pulse 83, temperature 97.9 F (36.6 C), temperature source Temporal, resp. rate 16, height 5' 5.5" (1.664 m), weight 161 lb 6.4 oz (73.2 kg), SpO2 96 %.  General: Alert, interactive, in no acute distress. HEENT: TMs pearly gray, turbinates mildly edematous with clear discharge, post-pharynx unremarkable. Neck: Supple without lymphadenopathy. Lungs: Clear to auscultation without wheezing, rhonchi or rales. CV: Normal S1, S2 without murmurs. Skin: Warm and dry, without lesions or rashes.  The following portions of the patient's history were reviewed and updated as appropriate: allergies, current medications, past family history, past medical history, past social history, past surgical history and problem list.  Current Outpatient Medications  Medication Sig Dispense Refill  . albuterol (VENTOLIN HFA) 108 (90 Base) MCG/ACT inhaler Inhale 2 puffs into the lungs every 4 (four) hours as needed for wheezing or shortness of breath. 54 g 1  . ALPRAZolam (XANAX) 0.5 MG tablet Take 1 tablet (0.5 mg total) by mouth 2 (two) times daily as needed for anxiety. 30 tablet 0  . Ascorbic Acid (VITAMIN C) 1000 MG tablet     . Azelastine HCl 137 MCG/SPRAY SOLN Place 2 sprays into both nostrils 2 (two) times daily.  30 mL 5  . Calcium Carbonate (CALCIUM 600 PO) Take 1 tablet by mouth daily.    . cephALEXin (KEFLEX) 500 MG capsule Take 1 capsule (500 mg total) by mouth 2 (two) times daily. 10 capsule 0   . Cholecalciferol (VITAMIN D) 2000 units tablet Take 2,000 Units by mouth daily.     . Cyanocobalamin (B-12) 1000 MCG CAPS     . ELDERBERRY PO Take 1 tablet by mouth daily as needed (for cold).     . fexofenadine-pseudoephedrine (ALLEGRA-D ALLERGY & CONGESTION) 60-120 MG 12 hr tablet Take 1 tablet by mouth 2 (two) times daily. 60 tablet 2  . fluticasone (FLOVENT HFA) 110 MCG/ACT inhaler Inhale 2 puffs into the lungs 2 (two) times daily. 36 g 1  . Fluticasone Propionate (XHANCE) 93 MCG/ACT EXHU Place 2 sprays into both nostrils daily. 32 mL 5  . folic acid (FOLVITE) [id number] MCG tablet     . levocetirizine (XYZAL ALLERGY 24HR) 5 MG tablet Take 1 tablet by mouth at bedtime.    . Magnesium 250 MG TABS     . Multiple Vitamins-Minerals (MULTIVITAMIN GUMMIES ADULT PO) Take 1 each by mouth daily. Super food    . Olopatadine HCl 0.2 % SOLN Apply 1 drop to eye daily as needed. 1 Bottle 5  . Probiotic Product (PROBIOTIC-10) CHEW Chew 1 each by mouth daily.     . promethazine (PHENERGAN) 12.5 MG tablet 1-2 tablets every six hours as needed for nausea 30 tablet 0  . albuterol (PROAIR HFA) 108 (90 Base) MCG/ACT inhaler Inhale 1-2 puffs into the lungs every 4 (four) hours as needed for wheezing or shortness of breath. 54 g 0   No current facility-administered medications for this visit.     Allergies  Allergen Reactions  . Augmentin [Amoxicillin-Pot Clavulanate] Diarrhea  . Ciprofloxacin Diarrhea  . Codeine Other (See Comments)    "sick" - ok to take with nausea med  . Shrimp [Shellfish Allergy]   . Sulfa Antibiotics Other (See Comments)    Generalized burning    I appreciate the opportunity to take part in Rhona's care. Please do not hesitate to contact me with questions.  Sincerely,   R. Edgar Frisk, MD

## 2019-06-15 NOTE — Assessment & Plan Note (Addendum)
Stable.  Continue appropriate allergen avoidance measures, immunotherapy injections as prescribed, levocetirizine (Xyzal) 5 mg daily as needed, Xhance daily, and nasal saline spray as needed and prior to medicated nasal sprays.   To avoid diminishing benefit with daily use (tachyphylaxis) of second generation antihistamine, consider alternating every few months between fexofenadine (Allegra) and levocetirizine (Xyzal).

## 2019-06-15 NOTE — Patient Instructions (Addendum)
Mild persistent asthma  For now, continue Flovent 110 g, 2 inhalations via spacer device twice daily, and albuterol HFA, 1 to 2 inhalations every 4-6 hours if needed.  During respiratory tract infections or asthma flares, increase Flovent 110g to 3 inhalations 3 times per day until symptoms have returned to baseline.  Subjective and objective measures of pulmonary function will be followed and the treatment plan will be adjusted accordingly.  Allergic rhinitis Stable.  Continue appropriate allergen avoidance measures, immunotherapy injections as prescribed, levocetirizine (Xyzal) 5 mg daily as needed, Xhance daily, and nasal saline spray as needed and prior to medicated nasal sprays.   To avoid diminishing benefit with daily use (tachyphylaxis) of second generation antihistamine, consider alternating every few months between fexofenadine (Allegra) and levocetirizine (Xyzal).   Return in about 5 months (around 11/13/2019), or if symptoms worsen or fail to improve.

## 2019-06-15 NOTE — Assessment & Plan Note (Signed)
   For now, continue Flovent 110 g, 2 inhalations via spacer device twice daily, and albuterol HFA, 1 to 2 inhalations every 4-6 hours if needed.  During respiratory tract infections or asthma flares, increase Flovent 110g to 3 inhalations 3 times per day until symptoms have returned to baseline.  Subjective and objective measures of pulmonary function will be followed and the treatment plan will be adjusted accordingly. 

## 2019-06-19 ENCOUNTER — Ambulatory Visit: Payer: Self-pay | Admitting: Obstetrics & Gynecology

## 2019-06-22 ENCOUNTER — Other Ambulatory Visit: Payer: Self-pay | Admitting: *Deleted

## 2019-06-22 ENCOUNTER — Other Ambulatory Visit: Payer: Self-pay | Admitting: Allergy and Immunology

## 2019-06-22 ENCOUNTER — Other Ambulatory Visit: Payer: Self-pay

## 2019-06-22 ENCOUNTER — Ambulatory Visit (INDEPENDENT_AMBULATORY_CARE_PROVIDER_SITE_OTHER): Payer: 59

## 2019-06-22 DIAGNOSIS — J309 Allergic rhinitis, unspecified: Secondary | ICD-10-CM

## 2019-06-22 MED ORDER — FLOVENT HFA 110 MCG/ACT IN AERO: 2.0000 | INHALATION_SPRAY | Freq: Two times a day (BID) | RESPIRATORY_TRACT | 5 refills | Status: DC

## 2019-06-22 MED ORDER — FLOVENT HFA 110 MCG/ACT IN AERO: 2.0000 | INHALATION_SPRAY | Freq: Two times a day (BID) | RESPIRATORY_TRACT | 0 refills | Status: DC

## 2019-06-22 NOTE — Telephone Encounter (Signed)
Patient called and said her meds that were sent to [facility] have been lost in the mail. She is requesting a refill for her [facility] [address] She needs a 30 day supply. She talked with [facility] and they said for [place] to send to [facility] and they will reject it, but they can override it.

## 2019-06-22 NOTE — Telephone Encounter (Signed)
Left message to let patient know that Flovent was sent to [facility].

## 2019-07-06 ENCOUNTER — Ambulatory Visit (INDEPENDENT_AMBULATORY_CARE_PROVIDER_SITE_OTHER): Payer: 59

## 2019-07-06 DIAGNOSIS — J309 Allergic rhinitis, unspecified: Secondary | ICD-10-CM

## 2019-07-09 ENCOUNTER — Encounter

## 2019-07-13 DIAGNOSIS — J301 Allergic rhinitis due to pollen: Secondary | ICD-10-CM | POA: Diagnosis not present

## 2019-07-13 NOTE — Progress Notes (Signed)
Vials exp 07-12-20

## 2019-07-14 DIAGNOSIS — J301 Allergic rhinitis due to pollen: Secondary | ICD-10-CM

## 2019-07-23 ENCOUNTER — Ambulatory Visit (INDEPENDENT_AMBULATORY_CARE_PROVIDER_SITE_OTHER): Payer: 59

## 2019-07-23 DIAGNOSIS — J309 Allergic rhinitis, unspecified: Secondary | ICD-10-CM

## 2019-08-11 ENCOUNTER — Ambulatory Visit (INDEPENDENT_AMBULATORY_CARE_PROVIDER_SITE_OTHER): Payer: 59

## 2019-08-11 DIAGNOSIS — J309 Allergic rhinitis, unspecified: Secondary | ICD-10-CM | POA: Diagnosis not present

## 2019-08-26 ENCOUNTER — Ambulatory Visit (INDEPENDENT_AMBULATORY_CARE_PROVIDER_SITE_OTHER): Payer: 59

## 2019-08-26 DIAGNOSIS — J309 Allergic rhinitis, unspecified: Secondary | ICD-10-CM | POA: Diagnosis not present

## 2019-09-09 ENCOUNTER — Ambulatory Visit (INDEPENDENT_AMBULATORY_CARE_PROVIDER_SITE_OTHER): Payer: 59

## 2019-09-09 DIAGNOSIS — J309 Allergic rhinitis, unspecified: Secondary | ICD-10-CM

## 2019-09-17 ENCOUNTER — Ambulatory Visit (INDEPENDENT_AMBULATORY_CARE_PROVIDER_SITE_OTHER): Payer: 59

## 2019-09-17 DIAGNOSIS — J309 Allergic rhinitis, unspecified: Secondary | ICD-10-CM

## 2019-09-24 ENCOUNTER — Ambulatory Visit (INDEPENDENT_AMBULATORY_CARE_PROVIDER_SITE_OTHER): Payer: 59

## 2019-09-24 DIAGNOSIS — J309 Allergic rhinitis, unspecified: Secondary | ICD-10-CM

## 2019-09-29 ENCOUNTER — Ambulatory Visit (INDEPENDENT_AMBULATORY_CARE_PROVIDER_SITE_OTHER): Payer: 59

## 2019-09-29 DIAGNOSIS — J309 Allergic rhinitis, unspecified: Secondary | ICD-10-CM

## 2019-10-07 ENCOUNTER — Ambulatory Visit (INDEPENDENT_AMBULATORY_CARE_PROVIDER_SITE_OTHER): Payer: 59

## 2019-10-07 DIAGNOSIS — J309 Allergic rhinitis, unspecified: Secondary | ICD-10-CM

## 2019-10-16 ENCOUNTER — Ambulatory Visit: Payer: 59

## 2019-10-28 ENCOUNTER — Ambulatory Visit (INDEPENDENT_AMBULATORY_CARE_PROVIDER_SITE_OTHER): Payer: 59

## 2019-10-28 DIAGNOSIS — J309 Allergic rhinitis, unspecified: Secondary | ICD-10-CM

## 2019-11-03 ENCOUNTER — Other Ambulatory Visit: Payer: Self-pay | Admitting: Allergy and Immunology

## 2019-11-18 ENCOUNTER — Ambulatory Visit (INDEPENDENT_AMBULATORY_CARE_PROVIDER_SITE_OTHER): Payer: 59

## 2019-11-18 DIAGNOSIS — J309 Allergic rhinitis, unspecified: Secondary | ICD-10-CM

## 2019-12-02 ENCOUNTER — Other Ambulatory Visit: Payer: Self-pay

## 2019-12-02 MED ORDER — XHANCE 93 MCG/ACT NA EXHU: 2.0000 | INHALANT_SUSPENSION | Freq: Two times a day (BID) | NASAL | 5 refills | Status: DC

## 2019-12-09 ENCOUNTER — Ambulatory Visit (INDEPENDENT_AMBULATORY_CARE_PROVIDER_SITE_OTHER): Payer: 59

## 2019-12-09 DIAGNOSIS — J309 Allergic rhinitis, unspecified: Secondary | ICD-10-CM | POA: Diagnosis not present

## 2019-12-11 NOTE — Progress Notes (Deleted)
44 y.o. G2P1 Divorced White or Caucasian female here for annual exam.    No LMP recorded (lmp unknown). Patient has had a hysterectomy.          Sexually active: {yes no:314532}  The current method of family planning is status post hysterectomy.    Exercising: {yes no:314532}  {types:19826} Smoker:  {YES NO:22349}  Health Maintenance: Pap:  12-06-17 LGSIL, 12-08-2018 neg HPV HR neg History of abnormal Pap:  yes MMG:  05-20-2019 category c density birads 1:neg Colonoscopy:  none BMD:   none TDaP:  2014 Pneumonia vaccine(s):  no Shingrix:   no Hep C testing: *** Screening Labs: ***   reports that she has never smoked. She has never used smokeless tobacco. She reports current alcohol use of about 3.0 standard drinks of alcohol per week. She reports that she does not use drugs.  Past Medical History:  Diagnosis Date  . Abnormal Pap smear    history of colposcopy ECC with atypical squamous metaplasia  . Anxiety   . Asthma   . Bilateral hearing loss    hearing aid in right ear and cochlear implant in left ear  . Bursitis of shoulder, right   . Congenital hearing loss    started age 34 -   . Depression    no meds  . Endometriosis 10/10  . Hx of migraines    increased frequency with estrogen containing OCPs  . Panic attacks   . Seasonal allergies     Past Surgical History:  Procedure Laterality Date  . BREAST REDUCTION SURGERY  9/10  . COCHLEAR IMPLANT Left 2014   moval of Envoy device  . CYSTOSCOPY N/A 04/01/2018   Procedure: CYSTOSCOPY;  Surgeon: Megan Salon, MD;  Location: [facility];  Service: Gynecology;  Laterality: N/A;  . IMPLANTATION OF ENVOY ESTEEM HEARING DEVICE     for hearing loss - now has been removed and replaced with cochlear implant 2014  . IUD REMOVAL N/A 04/01/2018   Procedure: INTRAUTERINE DEVICE (IUD) REMOVAL  possible;  Surgeon: Megan Salon, MD;  Location: [facility];  Service: Gynecology;  Laterality: N/A;  possible  . TOTAL LAPAROSCOPIC HYSTERECTOMY WITH  SALPINGECTOMY Bilateral 04/01/2018   Procedure: TOTAL LAPAROSCOPIC HYSTERECTOMY WITH SALPINGECTOMY WITH EXCISION OF ENDOMETRIOSIS;  Surgeon: Megan Salon, MD;  Location: [facility];  Service: Gynecology;  Laterality: Bilateral;  . TUBAL LIGATION  10/10   cautery of endometriosis  . WISDOM TOOTH EXTRACTION      Current Outpatient Medications  Medication Sig Dispense Refill  . albuterol (PROAIR HFA) 108 (90 Base) MCG/ACT inhaler Inhale 1-2 puffs into the lungs every 4 (four) hours as needed for wheezing or shortness of breath. 54 g 0  . albuterol (VENTOLIN HFA) 108 (90 Base) MCG/ACT inhaler Inhale 2 puffs into the lungs every 4 (four) hours as needed for wheezing or shortness of breath. 54 g 1  . ALPRAZolam (XANAX) 0.5 MG tablet Take 1 tablet (0.5 mg total) by mouth 2 (two) times daily as needed for anxiety. 30 tablet 0  . Ascorbic Acid (VITAMIN C) 1000 MG tablet     . Azelastine HCl 137 MCG/SPRAY SOLN Place 2 sprays into both nostrils 2 (two) times daily. 30 mL 5  . Calcium Carbonate (CALCIUM 600 PO) Take 1 tablet by mouth daily.    . cephALEXin (KEFLEX) 500 MG capsule Take 1 capsule (500 mg total) by mouth 2 (two) times daily. 10 capsule 0  . Cholecalciferol (VITAMIN D) 2000 units tablet Take  2,000 Units by mouth daily.     . Cyanocobalamin (B-12) 1000 MCG CAPS     . ELDERBERRY PO Take 1 tablet by mouth daily as needed (for cold).     . fexofenadine-pseudoephedrine (ALLEGRA-D ALLERGY & CONGESTION) 60-120 MG 12 hr tablet Take 1 tablet by mouth 2 (two) times daily. 60 tablet 2  . FLOVENT HFA 110 MCG/ACT inhaler USE 2 INHALATIONS ORALLY   TWICE DAILY 36 g 1  . Fluticasone Propionate (XHANCE) 93 MCG/ACT EXHU Place 2 sprays into both nostrils in the morning and at bedtime. 32 mL 5  . folic acid (FOLVITE) 250 MCG tablet     . levocetirizine (XYZAL ALLERGY 24HR) 5 MG tablet Take 1 tablet by mouth at bedtime.    . Magnesium 250 MG TABS     . Multiple Vitamins-Minerals (MULTIVITAMIN GUMMIES ADULT PO)  Take 1 each by mouth daily. Super food    . Olopatadine HCl 0.2 % SOLN Apply 1 drop to eye daily as needed. 1 Bottle 5  . Probiotic Product (PROBIOTIC-10) CHEW Chew 1 each by mouth daily.     . promethazine (PHENERGAN) 12.5 MG tablet 1-2 tablets every six hours as needed for nausea 30 tablet 0   No current facility-administered medications for this visit.    Family History  Problem Relation Age of Onset  . Deafness Mother   . Diabetes Mother   . Heart Problems Mother        tachycardia  . Ovarian cancer Mother 84  . Deafness Father   . Hypertension Father   . Cirrhosis Father        non alcoholic  . COPD Father   . Hyperlipidemia Father   . Down syndrome Sister   . Diabetes Paternal Grandmother   . Allergic rhinitis Neg Hx   . Angioedema Neg Hx   . Asthma Neg Hx   . Eczema Neg Hx   . Immunodeficiency Neg Hx   . Urticaria Neg Hx     Review of Systems  Exam:   LMP  (LMP Unknown)      General appearance: alert, cooperative and appears stated age Head: Normocephalic, without obvious abnormality, atraumatic Neck: no adenopathy, supple, symmetrical, trachea midline and thyroid {EXAM; THYROID:18604} Lungs: clear to auscultation bilaterally Breasts: {Exam; breast:13139::"normal appearance, no masses or tenderness"} Heart: regular rate and rhythm Abdomen: soft, non-tender; bowel sounds normal; no masses,  no organomegaly Extremities: extremities normal, atraumatic, no cyanosis or edema Skin: Skin color, texture, turgor normal. No rashes or lesions Lymph nodes: Cervical, supraclavicular, and axillary nodes normal. No abnormal inguinal nodes palpated Neurologic: Grossly normal   Pelvic: External genitalia:  no lesions              Urethra:  normal appearing urethra with no masses, tenderness or lesions              Bartholins and Skenes: normal                 Vagina: normal appearing vagina with normal color and discharge, no lesions              Cervix: {exam;  cervix:14595}              Pap taken: {yes no:314532} Bimanual Exam:  Uterus:  {exam; uterus:12215}              Adnexa: {exam; adnexa:12223}               Rectovaginal: Confirms  Anus:  normal sphincter tone, no lesions  Chaperone, ***Terence Lux, CMA, was present for exam.  A:  Well Woman with normal exam  P:   {plan; gyn:5269::"mammogram","pap smear","return annually or prn"}

## 2019-12-13 ENCOUNTER — Encounter: Payer: Self-pay | Admitting: Obstetrics & Gynecology

## 2019-12-14 ENCOUNTER — Other Ambulatory Visit: Payer: Self-pay

## 2019-12-14 ENCOUNTER — Other Ambulatory Visit: Payer: Self-pay | Admitting: Obstetrics & Gynecology

## 2019-12-14 MED ORDER — ALPRAZOLAM 0.5 MG PO TABS: 0.5000 mg | ORAL_TABLET | Freq: Two times a day (BID) | ORAL | 0 refills | Status: DC | PRN

## 2019-12-15 ENCOUNTER — Ambulatory Visit: Payer: 59 | Admitting: Obstetrics & Gynecology

## 2019-12-22 NOTE — Progress Notes (Signed)
44 y.o. G2P1 Divorced White or Caucasian female here for annual exam.  Having issues with worse.  Considering resigning.  Works for former [employer].  Denies vaginal bleeding.  Does not need STD testing this year.  No LMP recorded (lmp unknown). Patient has had a hysterectomy.          Sexually active: Yes.    The current method of family planning is status post hysterectomy.    Exercising: Yes.    treadmill Smoker:  no  Health Maintenance: Pap:  12-06-17 LGSIL, 12-08-2018 neg HPV HR neg History of abnormal Pap:  yes MMG:  05-20-2019 category c density birads 1:neg Colonoscopy:  none BMD:   none TDaP:  2014 Hep C testing: neg 2020 Screening Labs: seeing new PCP, Dr. Radene Knee, this year in July   reports that she has never smoked. She has never used smokeless tobacco. She reports current alcohol use of about 3.0 standard drinks of alcohol per week. She reports that she does not use drugs.  Past Medical History:  Diagnosis Date  . Abnormal Pap smear    history of colposcopy ECC with atypical squamous metaplasia  . Anxiety   . Asthma   . Bilateral hearing loss    hearing aid in right ear and cochlear implant in left ear  . Bursitis of shoulder, right   . Congenital hearing loss    started age 77 -   . Depression    no meds  . Endometriosis 10/10  . Hx of migraines    increased frequency with estrogen containing OCPs  . Panic attacks   . Seasonal allergies     Past Surgical History:  Procedure Laterality Date  . BREAST REDUCTION SURGERY  9/10  . COCHLEAR IMPLANT Left 2014   moval of Envoy device  . CYSTOSCOPY N/A 04/01/2018   Procedure: CYSTOSCOPY;  Surgeon: Megan Salon, MD;  Location: [facility];  Service: Gynecology;  Laterality: N/A;  . IMPLANTATION OF ENVOY ESTEEM HEARING DEVICE     for hearing loss - now has been removed and replaced with cochlear implant 2014  . IUD REMOVAL N/A 04/01/2018   Procedure: INTRAUTERINE DEVICE (IUD) REMOVAL  possible;  Surgeon: Megan Salon, MD;  Location: [facility];  Service: Gynecology;  Laterality: N/A;  possible  . TOTAL LAPAROSCOPIC HYSTERECTOMY WITH SALPINGECTOMY Bilateral 04/01/2018   Procedure: TOTAL LAPAROSCOPIC HYSTERECTOMY WITH SALPINGECTOMY WITH EXCISION OF ENDOMETRIOSIS;  Surgeon: Megan Salon, MD;  Location: [facility];  Service: Gynecology;  Laterality: Bilateral;  . TUBAL LIGATION  10/10   cautery of endometriosis  . WISDOM TOOTH EXTRACTION      Current Outpatient Medications  Medication Sig Dispense Refill  . albuterol (PROAIR HFA) 108 (90 Base) MCG/ACT inhaler Inhale 1-2 puffs into the lungs every 4 (four) hours as needed for wheezing or shortness of breath. 54 g 0  . ALPRAZolam (XANAX) 0.5 MG tablet Take 1 tablet (0.5 mg total) by mouth 2 (two) times daily as needed for anxiety. 20 tablet 0  . Ascorbic Acid (VITAMIN C) 1000 MG tablet     . Cholecalciferol (VITAMIN D) 2000 units tablet Take 2,000 Units by mouth daily.     Marland Kitchen ELDERBERRY PO Take 1 tablet by mouth daily as needed (for cold).     . fexofenadine-pseudoephedrine (ALLEGRA-D ALLERGY & CONGESTION) 60-120 MG 12 hr tablet Take 1 tablet by mouth 2 (two) times daily. 60 tablet 2  . FLOVENT HFA 110 MCG/ACT inhaler USE 2 INHALATIONS ORALLY  TWICE DAILY 36 g 1  . Fluticasone Propionate (XHANCE) 93 MCG/ACT EXHU Place 2 sprays into both nostrils in the morning and at bedtime. 32 mL 5  . levocetirizine (XYZAL ALLERGY 24HR) 5 MG tablet Take 1 tablet by mouth at bedtime.    . Magnesium 250 MG TABS     . Multiple Vitamins-Minerals (MULTIVITAMIN GUMMIES ADULT PO) Take 1 each by mouth daily. Super food    . Olopatadine HCl 0.2 % SOLN Apply 1 drop to eye daily as needed. 1 Bottle 5  . Probiotic Product (PROBIOTIC-10) CHEW Chew 1 each by mouth daily.     . promethazine (PHENERGAN) 12.5 MG tablet 1-2 tablets every six hours as needed for nausea 30 tablet 0   No current facility-administered medications for this visit.    Family History  Problem Relation Age of  Onset  . Deafness Mother   . Diabetes Mother   . Heart Problems Mother        tachycardia  . Ovarian cancer Mother 19  . Deafness Father   . Hypertension Father   . Cirrhosis Father        non alcoholic  . COPD Father   . Hyperlipidemia Father   . Down syndrome Sister   . Diabetes Paternal Grandmother   . Allergic rhinitis Neg Hx   . Angioedema Neg Hx   . Asthma Neg Hx   . Eczema Neg Hx   . Immunodeficiency Neg Hx   . Urticaria Neg Hx     Review of Systems  Constitutional: Negative.   HENT: Negative.   Eyes: Negative.   Respiratory: Negative.   Cardiovascular: Negative.   Gastrointestinal: Negative.   Endocrine: Negative.   Genitourinary: Negative.   Musculoskeletal: Negative.   Skin: Negative.   Allergic/Immunologic: Negative.   Neurological: Negative.   Hematological: Negative.   Psychiatric/Behavioral: Negative.     Exam:   BP 116/78   Pulse 68   Temp (!) 97.2 F (36.2 C) (Skin)   Resp 16   Ht 5' 5.75" (1.67 m)   Wt 158 lb (71.7 kg)   LMP  (LMP Unknown)   BMI 25.70 kg/m   Height: 5' 5.75" (167 cm)  General appearance: alert, cooperative and appears stated age Head: Normocephalic, without obvious abnormality, atraumatic Neck: no adenopathy, supple, symmetrical, trachea midline and thyroid normal to inspection and palpation Lungs: clear to auscultation bilaterally Breasts: normal appearance, no masses or tenderness Heart: regular rate and rhythm Abdomen: soft, non-tender; bowel sounds normal; no masses,  no organomegaly Extremities: extremities normal, atraumatic, no cyanosis or edema Skin: Skin color, texture, turgor normal. No rashes or lesions Lymph nodes: Cervical, supraclavicular, and axillary nodes normal. No abnormal inguinal nodes palpated Neurologic: Grossly normal   Pelvic: External genitalia:  no lesions              Urethra:  normal appearing urethra with no masses, tenderness or lesions              Bartholins and Skenes: normal                  Vagina: normal appearing vagina with normal color and discharge, no lesions              Cervix: no lesions              Pap taken: No. Bimanual Exam:  Uterus:  uterus absent              Adnexa: no mass, fullness,  tenderness               Rectovaginal: Confirms               Anus:  normal sphincter tone, no lesions  Chaperone, Terence Lux, CMA, was present for exam.  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  Doing yearly. pap smear/HR HPV due in 2023 Colonoscopy guidelines reviewed.  Will plan after birthday. Vaccines up to date. Plan PUS next year.  Pt and I discuss this yearly and she is aware there is no guideline for screening Return annually or prn

## 2019-12-23 ENCOUNTER — Other Ambulatory Visit: Payer: Self-pay

## 2019-12-24 ENCOUNTER — Encounter: Payer: Self-pay | Admitting: Obstetrics & Gynecology

## 2019-12-24 ENCOUNTER — Ambulatory Visit: Payer: 59 | Admitting: Obstetrics & Gynecology

## 2019-12-24 VITALS — BP 116/78 | HR 68 | Temp 97.2°F | Resp 16 | Ht 65.75 in | Wt 158.0 lb

## 2019-12-24 DIAGNOSIS — Z01419 Encounter for gynecological examination (general) (routine) without abnormal findings: Secondary | ICD-10-CM | POA: Diagnosis not present

## 2020-01-01 ENCOUNTER — Ambulatory Visit: Payer: Self-pay | Admitting: Obstetrics & Gynecology

## 2020-01-04 ENCOUNTER — Ambulatory Visit (INDEPENDENT_AMBULATORY_CARE_PROVIDER_SITE_OTHER): Payer: 59

## 2020-01-04 DIAGNOSIS — J309 Allergic rhinitis, unspecified: Secondary | ICD-10-CM | POA: Diagnosis not present

## 2020-01-20 DIAGNOSIS — J301 Allergic rhinitis due to pollen: Secondary | ICD-10-CM | POA: Diagnosis not present

## 2020-01-20 NOTE — Progress Notes (Signed)
EXP 01/19/21

## 2020-01-26 ENCOUNTER — Ambulatory Visit (INDEPENDENT_AMBULATORY_CARE_PROVIDER_SITE_OTHER): Payer: 59

## 2020-01-26 DIAGNOSIS — J309 Allergic rhinitis, unspecified: Secondary | ICD-10-CM

## 2020-02-15 ENCOUNTER — Ambulatory Visit (INDEPENDENT_AMBULATORY_CARE_PROVIDER_SITE_OTHER): Payer: 59

## 2020-02-15 DIAGNOSIS — J309 Allergic rhinitis, unspecified: Secondary | ICD-10-CM

## 2020-03-07 ENCOUNTER — Ambulatory Visit (INDEPENDENT_AMBULATORY_CARE_PROVIDER_SITE_OTHER): Payer: 59

## 2020-03-07 DIAGNOSIS — J309 Allergic rhinitis, unspecified: Secondary | ICD-10-CM

## 2020-03-08 ENCOUNTER — Other Ambulatory Visit: Payer: Self-pay | Admitting: Orthopedic Surgery

## 2020-03-08 DIAGNOSIS — M545 Low back pain, unspecified: Secondary | ICD-10-CM

## 2020-03-16 ENCOUNTER — Ambulatory Visit (INDEPENDENT_AMBULATORY_CARE_PROVIDER_SITE_OTHER): Payer: 59

## 2020-03-16 DIAGNOSIS — J309 Allergic rhinitis, unspecified: Secondary | ICD-10-CM | POA: Diagnosis not present

## 2020-03-18 ENCOUNTER — Ambulatory Visit
Admission: RE | Admit: 2020-03-18 | Discharge: 2020-03-18 | Disposition: A | Discharge status: Home / Self Care | Payer: 59 | Source: Ambulatory Visit | Attending: Orthopedic Surgery | Admitting: Orthopedic Surgery

## 2020-03-18 DIAGNOSIS — M545 Low back pain, unspecified: Secondary | ICD-10-CM

## 2020-03-23 ENCOUNTER — Ambulatory Visit (INDEPENDENT_AMBULATORY_CARE_PROVIDER_SITE_OTHER): Payer: 59 | Admitting: *Deleted

## 2020-03-23 DIAGNOSIS — J309 Allergic rhinitis, unspecified: Secondary | ICD-10-CM | POA: Diagnosis not present

## 2020-03-27 ENCOUNTER — Other Ambulatory Visit: Payer: Self-pay | Admitting: Allergy and Immunology

## 2020-03-30 ENCOUNTER — Other Ambulatory Visit: Payer: Self-pay

## 2020-03-30 MED ORDER — FLOVENT HFA 110 MCG/ACT IN AERO: INHALATION_SPRAY | RESPIRATORY_TRACT | 0 refills | Status: DC

## 2020-03-31 ENCOUNTER — Ambulatory Visit (INDEPENDENT_AMBULATORY_CARE_PROVIDER_SITE_OTHER): Payer: 59

## 2020-03-31 DIAGNOSIS — J309 Allergic rhinitis, unspecified: Secondary | ICD-10-CM

## 2020-04-05 ENCOUNTER — Ambulatory Visit (INDEPENDENT_AMBULATORY_CARE_PROVIDER_SITE_OTHER): Payer: 59 | Admitting: *Deleted

## 2020-04-05 DIAGNOSIS — J309 Allergic rhinitis, unspecified: Secondary | ICD-10-CM

## 2020-04-12 ENCOUNTER — Other Ambulatory Visit: Payer: Self-pay

## 2020-04-12 ENCOUNTER — Encounter: Payer: Self-pay | Admitting: Allergy and Immunology

## 2020-04-12 ENCOUNTER — Ambulatory Visit: Payer: 59 | Admitting: Allergy and Immunology

## 2020-04-12 VITALS — BP 108/66 | HR 70 | Temp 97.8°F | Resp 16 | Ht 65.5 in | Wt 160.4 lb

## 2020-04-12 DIAGNOSIS — J4530 Mild persistent asthma, uncomplicated: Secondary | ICD-10-CM

## 2020-04-12 DIAGNOSIS — J3089 Other allergic rhinitis: Secondary | ICD-10-CM | POA: Diagnosis not present

## 2020-04-12 MED ORDER — FLOVENT HFA 110 MCG/ACT IN AERO
INHALATION_SPRAY | RESPIRATORY_TRACT | 1 refills | Status: DC
Start: 2020-04-12 — End: 2020-06-30

## 2020-04-12 MED ORDER — OLOPATADINE HCL 0.2 % OP SOLN
1.0000 [drp] | Freq: Every day | OPHTHALMIC | 5 refills | Status: DC | PRN
Start: 2020-04-12 — End: 2020-10-19

## 2020-04-12 MED ORDER — XHANCE 93 MCG/ACT NA EXHU
2.0000 | INHALANT_SUSPENSION | Freq: Two times a day (BID) | NASAL | 5 refills | Status: DC
Start: 2020-04-12 — End: 2020-09-09

## 2020-04-12 NOTE — Assessment & Plan Note (Signed)
Stable.  Continue appropriate allergen avoidance measures and immunotherapy injections per protocol.   Continue levocetirizine (Xyzal) 5 mg daily if needed alternated with fexofenadine (Allegra every month or so.  Continue Xhance daily, and nasal saline spray as needed and prior to medicated nasal sprays.

## 2020-04-12 NOTE — Assessment & Plan Note (Signed)
   For now, continue Flovent 110 g, 2 inhalations via spacer device twice daily, and albuterol HFA, 1 to 2 inhalations every 4-6 hours if needed.  During respiratory tract infections or asthma flares, increase Flovent 110g to 3 inhalations 3 times per day until symptoms have returned to baseline.  Subjective and objective measures of pulmonary function will be followed and the treatment plan will be adjusted accordingly.

## 2020-04-12 NOTE — Patient Instructions (Addendum)
Mild persistent asthma  For now, continue Flovent 110 g, 2 inhalations via spacer device twice daily, and albuterol HFA, 1 to 2 inhalations every 4-6 hours if needed.  During respiratory tract infections or asthma flares, increase Flovent 110g to 3 inhalations 3 times per day until symptoms have returned to baseline.  Subjective and objective measures of pulmonary function will be followed and the treatment plan will be adjusted accordingly.  Allergic rhinitis Stable.  Continue appropriate allergen avoidance measures and immunotherapy injections per protocol.   Continue levocetirizine (Xyzal) 5 mg daily if needed alternated with fexofenadine (Allegra every month or so.  Continue Xhance daily, and nasal saline spray as needed and prior to medicated nasal sprays.    Return in about 5 months (around 09/10/2020), or if symptoms worsen or fail to improve.

## 2020-04-12 NOTE — Progress Notes (Signed)
Follow-up Note  RE: Margaret Ford MRN: [id number] DOB: 18-Nov-1975 Date of Office Visit: 04/12/2020  Primary care provider: [name], MD Referring provider: [name], MD  History of present illness: Margaret Ford is a 44 y.o. female with persistent asthma allergic rhinitis presenting today for follow-up.  She was last seen in December 2020.  She reports that her asthma has been well controlled with rare albuterol requirement and no limitations in normal daily activities or nocturnal awakenings due to lower respiratory symptoms.  She does experience some asthma symptoms and required albuterol rescue when hiking in the mountains or when biking.  She reports that her nasal allergy symptoms are well controlled with fexofenadine or levocetirizine as well as XHANCE daily.  She notes that if she misses the antihistamine for even a day or 2 she begins to experience allergy symptoms.  She is receiving allergy immunotherapy injections every 3 weeks without problems or complications.  She is scheduled to have right sided cochlear implant on May 12, 2020.  Assessment and plan: Mild persistent asthma  For now, continue Flovent 110 g, 2 inhalations via spacer device twice daily, and albuterol HFA, 1 to 2 inhalations every 4-6 hours if needed.  During respiratory tract infections or asthma flares, increase Flovent 110g to 3 inhalations 3 times per day until symptoms have returned to baseline.  Subjective and objective measures of pulmonary function will be followed and the treatment plan will be adjusted accordingly.  Allergic rhinitis Stable.  Continue appropriate allergen avoidance measures and immunotherapy injections per protocol.   Continue levocetirizine (Xyzal) 5 mg daily if needed alternated with fexofenadine (Allegra every month or so.  Continue Xhance daily, and nasal saline spray as needed and prior to medicated nasal sprays.    Meds ordered this  encounter  Medications  . fluticasone (FLOVENT HFA) 110 MCG/ACT inhaler    Sig: USE 2 INHALATIONS ORALLY   TWICE DAILY    Dispense:  36 g    Refill:  1  . Olopatadine HCl 0.2 % SOLN    Sig: Apply 1 drop to eye daily as needed.    Dispense:  2.5 mL    Refill:  5  . Fluticasone Propionate (XHANCE) 93 MCG/ACT EXHU    Sig: Place 2 sprays into both nostrils in the morning and at bedtime.    Dispense:  32 mL    Refill:  5    J30.9 Astepro, Astelin, Flonase, Qnasl    Diagnostics: Spirometry:  Normal with an FEV1 of 115% predicted. This study was performed while the patient was asymptomatic.  Please see scanned spirometry results for details.    Physical examination: Blood pressure 108/66, pulse 70, temperature 97.8 F (36.6 C), temperature source Temporal, resp. rate 16, height 5' 5.5" (1.664 m), weight 160 lb 6.4 oz (72.8 kg), SpO2 100 %.  General: Alert, interactive, in no acute distress. HEENT: Turbinates minimally edematous without discharge, post-pharynx unremarkable. Neck: Supple without lymphadenopathy. Lungs: Clear to auscultation without wheezing, rhonchi or rales. CV: Normal S1, S2 without murmurs. Skin: Warm and dry, without lesions or rashes.  The following portions of the patient's history were reviewed and updated as appropriate: allergies, current medications, past family history, past medical history, past social history, past surgical history and problem list.  Current Outpatient Medications  Medication Sig Dispense Refill  . albuterol (PROAIR HFA) 108 (90 Base) MCG/ACT inhaler Inhale 1-2 puffs into the lungs every 4 (four) hours as needed for wheezing or shortness of breath.  54 g 0  . ALPRAZolam (XANAX) 0.5 MG tablet Take 1 tablet (0.5 mg total) by mouth 2 (two) times daily as needed for anxiety. 20 tablet 0  . Ascorbic Acid (VITAMIN C) 1000 MG tablet     . Cholecalciferol (VITAMIN D) 2000 units tablet Take 2,000 Units by mouth daily.     Marland Kitchen ELDERBERRY PO Take 1  tablet by mouth daily as needed (for cold).     . fexofenadine-pseudoephedrine (ALLEGRA-D ALLERGY & CONGESTION) 60-120 MG 12 hr tablet Take 1 tablet by mouth 2 (two) times daily. 60 tablet 2  . fluticasone (FLOVENT HFA) 110 MCG/ACT inhaler USE 2 INHALATIONS ORALLY   TWICE DAILY 36 g 1  . Fluticasone Propionate (XHANCE) 93 MCG/ACT EXHU Place 2 sprays into both nostrils in the morning and at bedtime. 32 mL 5  . levocetirizine (XYZAL ALLERGY 24HR) 5 MG tablet Take 1 tablet by mouth at bedtime.    . Magnesium 250 MG TABS     . Multiple Vitamins-Minerals (MULTIVITAMIN GUMMIES ADULT PO) Take 1 each by mouth daily. Super food    . Olopatadine HCl 0.2 % SOLN Apply 1 drop to eye daily as needed. 2.5 mL 5  . Probiotic Product (PROBIOTIC-10) CHEW Chew 1 each by mouth daily.     . promethazine (PHENERGAN) 12.5 MG tablet 1-2 tablets every six hours as needed for nausea 30 tablet 0   No current facility-administered medications for this visit.    Allergies  Allergen Reactions  . Augmentin [Amoxicillin-Pot Clavulanate] Diarrhea  . Ciprofloxacin Diarrhea  . Codeine Other (See Comments)    "sick" - ok to take with nausea med  . Shrimp [Shellfish Allergy]   . Sulfa Antibiotics Other (See Comments)    Generalized burning    I appreciate the opportunity to take part in Cashlynn's care. Please do not hesitate to contact me with questions.  Sincerely,   R. Edgar Frisk, MD

## 2020-05-03 ENCOUNTER — Ambulatory Visit (INDEPENDENT_AMBULATORY_CARE_PROVIDER_SITE_OTHER): Payer: 59

## 2020-05-03 DIAGNOSIS — J3089 Other allergic rhinitis: Secondary | ICD-10-CM

## 2020-06-06 ENCOUNTER — Ambulatory Visit (INDEPENDENT_AMBULATORY_CARE_PROVIDER_SITE_OTHER): Payer: 59 | Admitting: *Deleted

## 2020-06-06 DIAGNOSIS — J309 Allergic rhinitis, unspecified: Secondary | ICD-10-CM | POA: Diagnosis not present

## 2020-06-29 ENCOUNTER — Ambulatory Visit (INDEPENDENT_AMBULATORY_CARE_PROVIDER_SITE_OTHER): Payer: 59

## 2020-06-29 ENCOUNTER — Other Ambulatory Visit: Payer: Self-pay | Admitting: Allergy and Immunology

## 2020-06-29 DIAGNOSIS — J309 Allergic rhinitis, unspecified: Secondary | ICD-10-CM | POA: Diagnosis not present

## 2020-07-15 ENCOUNTER — Other Ambulatory Visit: Payer: Self-pay | Admitting: *Deleted

## 2020-07-15 MED ORDER — ALBUTEROL SULFATE HFA 108 (90 BASE) MCG/ACT IN AERS: 1.0000 | INHALATION_SPRAY | RESPIRATORY_TRACT | 0 refills | Status: DC | PRN

## 2020-07-15 NOTE — Telephone Encounter (Signed)
Is she asking for the albuterol inhaler?

## 2020-07-15 NOTE — Telephone Encounter (Signed)
Hey Dr Verlin Fester,   Last time I was in this area [facility] head) I had major asthma attack.   Well here I am for [employer] and I'm getting the asthmatic cough.  I forgot my abuterol.  Driving home tomorrow (Friday).   I have my Flovent which I'm taking 3 puffs 3 times a day. Chest is still tight.  Allegra D for the allergy issues.  I was taking just Allegra and rotating with Xyzal but it wasn't doing it.  Now I've got drainage (of course).  Any other suggestions. I will be back tomorrow so prob won't make it in time before offices close.   There's a [facility]  nearby [address] [place], [place] [address] [phone]. I'm hoping I can wait until I get back. Because I have some at home from last years script.  It's not bad yet, I've been much worse.  No fever , no sore throat.    But I do need that script renewed please.   Thinking I'm allergic to the trees or Spanish moss.   Just ugh.

## 2020-07-27 ENCOUNTER — Ambulatory Visit (INDEPENDENT_AMBULATORY_CARE_PROVIDER_SITE_OTHER): Payer: 59 | Admitting: *Deleted

## 2020-07-27 DIAGNOSIS — J309 Allergic rhinitis, unspecified: Secondary | ICD-10-CM

## 2020-08-08 DIAGNOSIS — J301 Allergic rhinitis due to pollen: Secondary | ICD-10-CM | POA: Diagnosis not present

## 2020-08-08 NOTE — Progress Notes (Signed)
Vials exp 08-09-21 

## 2020-08-15 DIAGNOSIS — J3089 Other allergic rhinitis: Secondary | ICD-10-CM | POA: Diagnosis not present

## 2020-08-15 NOTE — Progress Notes (Signed)
LABELS FOR BILLING 

## 2020-08-22 ENCOUNTER — Ambulatory Visit (INDEPENDENT_AMBULATORY_CARE_PROVIDER_SITE_OTHER): Payer: 59 | Admitting: *Deleted

## 2020-08-22 DIAGNOSIS — J309 Allergic rhinitis, unspecified: Secondary | ICD-10-CM | POA: Diagnosis not present

## 2020-09-06 ENCOUNTER — Encounter (HOSPITAL_BASED_OUTPATIENT_CLINIC_OR_DEPARTMENT_OTHER): Payer: Self-pay

## 2020-09-09 ENCOUNTER — Other Ambulatory Visit (HOSPITAL_BASED_OUTPATIENT_CLINIC_OR_DEPARTMENT_OTHER): Payer: Self-pay | Admitting: Obstetrics & Gynecology

## 2020-09-09 ENCOUNTER — Other Ambulatory Visit: Payer: Self-pay

## 2020-09-09 DIAGNOSIS — Z1211 Encounter for screening for malignant neoplasm of colon: Secondary | ICD-10-CM

## 2020-09-09 MED ORDER — XHANCE 93 MCG/ACT NA EXHU: 2.0000 | INHALANT_SUSPENSION | Freq: Two times a day (BID) | NASAL | 0 refills | Status: DC

## 2020-09-14 ENCOUNTER — Ambulatory Visit (INDEPENDENT_AMBULATORY_CARE_PROVIDER_SITE_OTHER): Payer: 59

## 2020-09-14 DIAGNOSIS — J309 Allergic rhinitis, unspecified: Secondary | ICD-10-CM

## 2020-10-04 ENCOUNTER — Other Ambulatory Visit: Payer: Self-pay | Admitting: *Deleted

## 2020-10-04 ENCOUNTER — Ambulatory Visit (INDEPENDENT_AMBULATORY_CARE_PROVIDER_SITE_OTHER): Payer: 59 | Admitting: *Deleted

## 2020-10-04 DIAGNOSIS — J309 Allergic rhinitis, unspecified: Secondary | ICD-10-CM

## 2020-10-04 MED ORDER — XHANCE 93 MCG/ACT NA EXHU: 2.0000 | INHALANT_SUSPENSION | Freq: Two times a day (BID) | NASAL | 0 refills | Status: DC

## 2020-10-12 ENCOUNTER — Ambulatory Visit (INDEPENDENT_AMBULATORY_CARE_PROVIDER_SITE_OTHER): Payer: 59

## 2020-10-12 ENCOUNTER — Other Ambulatory Visit: Payer: Self-pay

## 2020-10-12 DIAGNOSIS — J309 Allergic rhinitis, unspecified: Secondary | ICD-10-CM

## 2020-10-17 ENCOUNTER — Telehealth: Payer: Self-pay | Admitting: Family Medicine

## 2020-10-17 ENCOUNTER — Other Ambulatory Visit: Payer: Self-pay

## 2020-10-17 MED ORDER — XHANCE 93 MCG/ACT NA EXHU: 2.0000 | INHALANT_SUSPENSION | Freq: Two times a day (BID) | NASAL | 5 refills | Status: DC

## 2020-10-17 NOTE — Telephone Encounter (Signed)
Pt states refill for Margaret Ford is normally prescribed 2 boxes per prescription and she only received one.

## 2020-10-17 NOTE — Telephone Encounter (Signed)
NOT sure what happened but I have resent in the rx for 2 boxes instead of 1

## 2020-10-19 ENCOUNTER — Ambulatory Visit: Payer: 59 | Admitting: Family Medicine

## 2020-10-19 ENCOUNTER — Other Ambulatory Visit: Payer: Self-pay

## 2020-10-19 ENCOUNTER — Encounter: Payer: Self-pay | Admitting: Family Medicine

## 2020-10-19 ENCOUNTER — Ambulatory Visit: Payer: Self-pay

## 2020-10-19 VITALS — BP 110/80 | HR 66 | Temp 97.9°F | Resp 16

## 2020-10-19 DIAGNOSIS — J302 Other seasonal allergic rhinitis: Secondary | ICD-10-CM | POA: Insufficient documentation

## 2020-10-19 DIAGNOSIS — H1010 Acute atopic conjunctivitis, unspecified eye: Secondary | ICD-10-CM

## 2020-10-19 DIAGNOSIS — Z9621 Cochlear implant status: Secondary | ICD-10-CM

## 2020-10-19 DIAGNOSIS — H1013 Acute atopic conjunctivitis, bilateral: Secondary | ICD-10-CM

## 2020-10-19 DIAGNOSIS — J4540 Moderate persistent asthma, uncomplicated: Secondary | ICD-10-CM | POA: Diagnosis not present

## 2020-10-19 DIAGNOSIS — J309 Allergic rhinitis, unspecified: Secondary | ICD-10-CM

## 2020-10-19 DIAGNOSIS — J3089 Other allergic rhinitis: Secondary | ICD-10-CM | POA: Insufficient documentation

## 2020-10-19 MED ORDER — EPINEPHRINE 0.3 MG/0.3ML IJ SOAJ: 0.3000 mg | Freq: Once | INTRAMUSCULAR | 2 refills | Status: AC

## 2020-10-19 MED ORDER — XHANCE 93 MCG/ACT NA EXHU: 2.0000 | INHALANT_SUSPENSION | Freq: Two times a day (BID) | NASAL | 5 refills | Status: DC

## 2020-10-19 NOTE — Patient Instructions (Signed)
Asthma Continue Flovent 110-2 puffs twice a day with a spacer to prevent cough or wheeze Continue albuterol 2 puffs once every 4 hours as needed for cough or wheeze You may use albuterol 2 puffs 5 to 15 minutes before exercise to decrease cough or wheeze  For asthma flare, begin Flovent 110-3 puffs 3 times a day for 2 weeks or until cough and wheeze free  Allergic rhinitis Continue allergen avoidance measures directed toward pollen, mold, and dust mites Continue allergen immunotherapy and have access to an epinephrine auto-injector. You may begin allergen immunotherapy once every 4 weeks after you finish the build-up phase Continue Xyzal 5 mg once a day as needed for runny nose or itch.Remember to rotate to a different antihistamine about every 3 months. Some examples of over the counter antihistamines include Zyrtec (cetirizine), Xyzal (levocetirizine), Allegra (fexofenadine), and Claritin (loratidine).  Continue XHANCE 2 sprays in each nostril twice a day for nasal symptoms Continue nasal saline mist as you have been  Allergic conjunctivitis Some over the counter eye drops include Pataday one drop in each eye once a day as needed for red, itchy eyes OR Zaditor one drop in each eye twice a day as needed for red itchy eyes.  Call the clinic if this treatment plan is not working well for you  Follow up in 6 months or sooner if needed.  Reducing Pollen Exposure The American Academy of Allergy, Asthma and Immunology suggests the following steps to reduce your exposure to pollen during allergy seasons. 1. Do not hang sheets or clothing out to dry; pollen may collect on these items. 2. Do not mow lawns or spend time around freshly cut grass; mowing stirs up pollen. 3. Keep windows closed at night.  Keep car windows closed while driving. 4. Minimize morning activities outdoors, a time when pollen counts are usually at their highest. 5. Stay indoors as much as possible when pollen counts or  humidity is high and on windy days when pollen tends to remain in the air longer. 6. Use air conditioning when possible.  Many air conditioners have filters that trap the pollen spores. 7. Use a HEPA room air filter to remove pollen form the indoor air you breathe.  Control of Mold Allergen Mold and fungi can grow on a variety of surfaces provided certain temperature and moisture conditions exist.  Outdoor molds grow on plants, decaying vegetation and soil.  The major outdoor mold, Alternaria and Cladosporium, are found in very high numbers during hot and dry conditions.  Generally, a late Summer - Fall peak is seen for common outdoor fungal spores.  Rain will temporarily lower outdoor mold spore count, but counts rise rapidly when the rainy period ends.  The most important indoor molds are Aspergillus and Penicillium.  Dark, humid and poorly ventilated basements are ideal sites for mold growth.  The next most common sites of mold growth are the bathroom and the kitchen.  Outdoor [employer] 8. Use air conditioning and keep windows closed 9. Avoid exposure to decaying vegetation. 10. Avoid leaf raking. 11. Avoid grain handling. 12. Consider wearing a face mask if working in moldy areas.  Indoor Mold Control 1. Maintain humidity below 50%. 2. Clean washable surfaces with 5% bleach solution. 3. Remove sources e.g. Contaminated carpets.   Control of Dust Mite Allergen Dust mites play a major role in allergic asthma and rhinitis. They occur in environments with high humidity wherever human skin is found. Dust mites absorb humidity from the atmosphere (  ie, they do not drink) and feed on organic matter (including shed human and animal skin). Dust mites are a microscopic type of insect that you cannot see with the naked eye. High levels of dust mites have been detected from mattresses, pillows, carpets, upholstered furniture, bed covers, clothes, soft toys and any woven material. The principal  allergen of the dust mite is found in its feces. A gram of dust may contain 1,000 mites and 250,000 fecal particles. Mite antigen is easily measured in the air during house cleaning activities. Dust mites do not bite and do not cause harm to humans, other than by triggering allergies/asthma.  Ways to decrease your exposure to dust mites in your home:  1. Encase mattresses, box springs and pillows with a mite-impermeable barrier or cover  2. Wash sheets, blankets and drapes weekly in hot water (130 F) with detergent and dry them in a dryer on the hot setting.  3. Have the room cleaned frequently with a vacuum cleaner and a damp dust-mop. For carpeting or rugs, vacuuming with a vacuum cleaner equipped with a high-efficiency particulate air (HEPA) filter. The dust mite allergic individual should not be in a room which is being cleaned and should wait 1 hour after cleaning before going into the room.  4. Do not sleep on upholstered furniture (eg, couches).  5. If possible removing carpeting, upholstered furniture and drapery from the home is ideal. Horizontal blinds should be eliminated in the rooms where the person spends the most time (bedroom, study, television room). Washable vinyl, roller-type shades are optimal.  6. Remove all non-washable stuffed toys from the bedroom. Wash stuffed toys weekly like sheets and blankets above.  7. Reduce indoor humidity to less than 50%. Inexpensive humidity monitors can be purchased at most hardware stores. Do not use a humidifier as can make the problem worse and are not recommended.

## 2020-10-19 NOTE — Progress Notes (Signed)
[name] [address] Dept: [phone]  FOLLOW UP NOTE  Patient ID: Margaret Ford, female    DOB: [birth year]  Age: 45 y.o. MRN: [id number] Date of Office Visit: 10/19/2020  Assessment  Chief Complaint: Allergies (Patient is doing good, refills on xhance) and Asthma (Doing good/)  HPI Margaret Ford is a 45 year old female who presents to the clinic for follow-up visit.  She was last seen in this clinic on 04/12/2020 by Dr. Verlin Fester for evaluation of asthma, allergic rhinitis on allergen immunotherapy, and allergic conjunctivitis.  At today's visit, she reports her asthma has been well controlled with no shortness of breath, cough, or wheeze with activity or rest.  She continues Flovent 110-2 puffs twice a day without a spacer and uses albuterol before activity.  She does report that she had a flare in January 2022 for which she increased her Flovent to 3 puffs 3 times a day with resolution of symptoms.  Allergic rhinitis is reported as moderately well controlled with intermittent symptoms including clear rhinorrhea, sneezing, and postnasal drainage with frequent throat clearing.  She continues rotating Allegra and Xyzal and is currently using XHANCE 2 sprays in each nostril twice a day.  She continues using arm and Hammer nasal saline spray daily.  She continues allergen immunotherapy directed toward pollen, mold, and dust mite with no large local reactions.  She reports a significant decrease in her symptoms of allergic rhinitis while continuing on allergen immunotherapy.  Allergic conjunctivitis is reported as well controlled with over-the-counter allergy eyedrops as needed.  Her current medications are listed in the chart.   Drug Allergies:  Allergies  Allergen Reactions  . Shrimp Extract Allergy Skin Test Nausea And Vomiting and Shortness Of Breath    Other reaction(s): Wheezing  . Augmentin [Amoxicillin-Pot Clavulanate] Diarrhea  . Ciprofloxacin Diarrhea   . Codeine Other (See Comments)    "sick" - ok to take with nausea med  . Shrimp [Shellfish Allergy]   . Sulfa Antibiotics Other (See Comments)    Generalized burning    Physical Exam: BP 110/80 (BP Location: Right Arm, Patient Position: Sitting, Cuff Size: Normal)   Pulse 66   Temp 97.9 F (36.6 C) (Temporal)   Resp 16   LMP  (LMP Unknown)   SpO2 98%    Physical Exam Vitals reviewed.  Constitutional:      Appearance: Normal appearance.  HENT:     Head: Normocephalic and atraumatic.     Right Ear: Tympanic membrane normal.     Left Ear: Tympanic membrane normal.     Nose:     Comments: Bilateral nares slightly erythematous with clear nasal drainage noted.  Pharynx normal.  Ears normal.  Eyes normal.    Mouth/Throat:     Pharynx: Oropharynx is clear.  Eyes:     Conjunctiva/sclera: Conjunctivae normal.  Cardiovascular:     Rate and Rhythm: Normal rate and regular rhythm.     Heart sounds: Normal heart sounds. No murmur heard.   Pulmonary:     Effort: Pulmonary effort is normal.     Breath sounds: Normal breath sounds.     Comments: Lungs clear to auscultation Musculoskeletal:        General: Normal range of motion.     Cervical back: Normal range of motion and neck supple.  Skin:    General: Skin is warm and dry.  Neurological:     Mental Status: She is alert and oriented to person, place, and time.  Psychiatric:        Mood and Affect: Mood normal.        Behavior: Behavior normal.        Thought Content: Thought content normal.        Judgment: Judgment normal.     Diagnostics: FVC 3.86, FEV1 3.15.  Predicted FVC 3.89, predicted FEV1 3.12.  Spirometry indicates normal ventilatory function.  Assessment and Plan: 1. Moderate persistent asthma without complication   2. Seasonal and perennial allergic rhinitis   3. Seasonal allergic conjunctivitis   4. History of cochlear implant     Meds ordered this encounter  Medications  . Fluticasone Propionate  (XHANCE) 93 MCG/ACT EXHU    Sig: Place 2 sprays into both nostrils in the morning and at bedtime.    Dispense:  32 mL    Refill:  5    J30.9 Astepro, Astelin, Flonase, Qnasl  . EPINEPHrine (AUVI-Q) 0.3 mg/0.3 mL IJ SOAJ injection    Sig: Inject 0.3 mg into the muscle once for 1 dose. As directed for life-threatening allergic reactions    Dispense:  2 each    Refill:  2    Patient Instructions  Asthma Continue Flovent 110-2 puffs twice a day with a spacer to prevent cough or wheeze Continue albuterol 2 puffs once every 4 hours as needed for cough or wheeze You may use albuterol 2 puffs 5 to 15 minutes before exercise to decrease cough or wheeze  For asthma flare, begin Flovent 110-3 puffs 3 times a day for 2 weeks or until cough and wheeze free  Allergic rhinitis Continue allergen avoidance measures directed toward pollen, mold, and dust mites Continue allergen immunotherapy and have access to an epinephrine auto-injector. You may begin allergen immunotherapy once every 4 weeks after you finish the build-up phase Continue Xyzal 5 mg once a day as needed for runny nose or itch.Remember to rotate to a different antihistamine about every 3 months. Some examples of over the counter antihistamines include Zyrtec (cetirizine), Xyzal (levocetirizine), Allegra (fexofenadine), and Claritin (loratidine).  Continue XHANCE 2 sprays in each nostril twice a day for nasal symptoms Continue nasal saline mist as you have been  Allergic conjunctivitis Some over the counter eye drops include Pataday one drop in each eye once a day as needed for red, itchy eyes OR Zaditor one drop in each eye twice a day as needed for red itchy eyes.  Call the clinic if this treatment plan is not working well for you  Follow up in 6 months or sooner if needed.   Return in about 6 months (around 04/20/2021), or if symptoms worsen or fail to improve.    Thank you for the opportunity to care for this patient.  Please do  not hesitate to contact me with questions.  Gareth Morgan, FNP [facility]

## 2020-10-26 ENCOUNTER — Other Ambulatory Visit: Payer: Self-pay

## 2020-10-26 ENCOUNTER — Ambulatory Visit (AMBULATORY_SURGERY_CENTER): Payer: Self-pay

## 2020-10-26 ENCOUNTER — Other Ambulatory Visit: Payer: 59

## 2020-10-26 VITALS — Ht 65.5 in | Wt 162.0 lb

## 2020-10-26 DIAGNOSIS — Z1211 Encounter for screening for malignant neoplasm of colon: Secondary | ICD-10-CM

## 2020-10-26 NOTE — Progress Notes (Signed)
No egg or soy allergy known to patient  No issues with past sedation with any surgeries or procedures Patient denies ever being told they had issues or difficulty with intubation  No FH of Malignant Hyperthermia No diet pills per patient No home 02 use per patient  No blood thinners per patient  Pt denies issues with constipation  No A fib or A flutter  EMMI video via MyChart  COVID 19 guidelines implemented in [place] today with Pt and RN  Pt is fully vaccinated for Covid x 2 + booster;  Discussed with pt there will be an out-of-pocket cost for prep and that varies from $0 to 70 dollars  Due to the COVID-19 pandemic we are asking patients to follow certain guidelines.  Pt aware of COVID protocols and [facility] guidelines

## 2020-10-27 ENCOUNTER — Ambulatory Visit (INDEPENDENT_AMBULATORY_CARE_PROVIDER_SITE_OTHER): Payer: 59 | Admitting: *Deleted

## 2020-10-27 DIAGNOSIS — J309 Allergic rhinitis, unspecified: Secondary | ICD-10-CM

## 2020-11-03 ENCOUNTER — Ambulatory Visit (INDEPENDENT_AMBULATORY_CARE_PROVIDER_SITE_OTHER): Payer: 59 | Admitting: *Deleted

## 2020-11-03 DIAGNOSIS — J309 Allergic rhinitis, unspecified: Secondary | ICD-10-CM

## 2020-11-08 ENCOUNTER — Encounter: Payer: Self-pay | Admitting: Internal Medicine

## 2020-11-09 ENCOUNTER — Other Ambulatory Visit: Payer: Self-pay

## 2020-11-09 ENCOUNTER — Ambulatory Visit (AMBULATORY_SURGERY_CENTER): Payer: 59 | Admitting: Internal Medicine

## 2020-11-09 ENCOUNTER — Encounter: Payer: Self-pay | Admitting: Internal Medicine

## 2020-11-09 VITALS — BP 105/59 | HR 72 | Temp 97.8°F | Resp 18 | Ht 65.5 in | Wt 162.0 lb

## 2020-11-09 DIAGNOSIS — Z1211 Encounter for screening for malignant neoplasm of colon: Secondary | ICD-10-CM

## 2020-11-09 MED ORDER — SODIUM CHLORIDE 0.9 % IV SOLN
500.0000 mL | Freq: Once | INTRAVENOUS | Status: DC
Start: 2020-11-09 — End: 2020-11-09

## 2020-11-09 NOTE — Patient Instructions (Addendum)
The colonoscopy was normal - no polyps or cancer identified.  Next routine colonoscopy or other screening test in 10 years - 2032.  I appreciate the opportunity to care for you. Gatha Mayer, MD, [facility]  Continue present medications.  YOU HAD AN ENDOSCOPIC PROCEDURE TODAY AT [facility]:   Refer to the procedure report that was given to you for any specific questions about what was found during the examination.  If the procedure report does not answer your questions, please call your gastroenterologist to clarify.  If you requested that your care partner not be given the details of your procedure findings, then the procedure report has been included in a sealed envelope for you to review at your convenience later.  YOU SHOULD EXPECT: Some feelings of bloating in the abdomen. Passage of more gas than usual.  Walking can help get rid of the air that was put into your GI tract during the procedure and reduce the bloating. If you had a lower endoscopy (such as a colonoscopy or flexible sigmoidoscopy) you may notice spotting of blood in your stool or on the toilet paper. If you underwent a bowel prep for your procedure, you may not have a normal bowel movement for a few days.  Please Note:  You might notice some irritation and congestion in your nose or some drainage.  This is from the oxygen used during your procedure.  There is no need for concern and it should clear up in a day or so.  SYMPTOMS TO REPORT IMMEDIATELY:   Following lower endoscopy (colonoscopy or flexible sigmoidoscopy):  Excessive amounts of blood in the stool  Significant tenderness or worsening of abdominal pains  Swelling of the abdomen that is new, acute  Fever of 100F or higher   For urgent or emergent issues, a gastroenterologist can be reached at any hour by calling [phone]. Do not use MyChart messaging for urgent concerns.    DIET:  We do recommend a small meal at first, but then you may  proceed to your regular diet.  Drink plenty of fluids but you should avoid alcoholic beverages for 24 hours.  ACTIVITY:  You should plan to take it easy for the rest of today and you should NOT DRIVE or use heavy machinery until tomorrow (because of the sedation medicines used during the test).    FOLLOW UP: Our staff will call the number listed on your records 48-72 hours following your procedure to check on you and address any questions or concerns that you may have regarding the information given to you following your procedure. If we do not reach you, we will leave a message.  We will attempt to reach you two times.  During this call, we will ask if you have developed any symptoms of COVID 19. If you develop any symptoms (ie: fever, flu-like symptoms, shortness of breath, cough etc.) before then, please call [phone].  If you test positive for Covid 19 in the 2 weeks post procedure, please call and report this information to [place].    If any biopsies were taken you will be contacted by phone or by letter within the next 1-3 weeks.  Please call us at [phone] if you have not heard about the biopsies in 3 weeks.    SIGNATURES/CONFIDENTIALITY: You and/or your care partner have signed paperwork which will be entered into your electronic medical record.  These signatures attest to the fact that that the information above on your After  Visit Summary has been reviewed and is understood.  Full responsibility of the confidentiality of this discharge information lies with you and/or your care-partner. 

## 2020-11-09 NOTE — Op Note (Signed)
Margaret Ford Patient Name: Margaret Ford Procedure Date: 11/09/2020 2:23 PM MRN: [id number] Endoscopist: Gatha Ford , MD Age: 45 Referring MD:  Date of Birth: 1976/04/21 Gender: Female Account #: [id number] Procedure:                Colonoscopy Indications:              Screening for colorectal malignant neoplasm, This                            is the patient's first colonoscopy Medicines:                Propofol per Anesthesia, Monitored Anesthesia Care Procedure:                Pre-Anesthesia Assessment:                           - Prior to the procedure, a History and Physical                            was performed, and patient medications and                            allergies were reviewed. The patient's tolerance of                            previous anesthesia was also reviewed. The risks                            and benefits of the procedure and the sedation                            options and risks were discussed with the patient.                            All questions were answered, and informed consent                            was obtained. Prior Anticoagulants: The patient has                            taken no previous anticoagulant or antiplatelet                            agents. ASA Grade Assessment: II - A patient with                            mild systemic disease. After reviewing the risks                            and benefits, the patient was deemed in                            satisfactory condition to undergo the procedure.  After obtaining informed consent, the colonoscope                            was passed under direct vision. Throughout the                            procedure, the patient's blood pressure, pulse, and                            oxygen saturations were monitored continuously. The                            Olympus CF-HQ190 [id number]) Colonoscope was                             introduced through the anus and advanced to the the                            cecum, identified by appendiceal orifice and                            ileocecal valve. The ileocecal valve, appendiceal                            orifice, and rectum were photographed. The quality                            of the bowel preparation was good. The colonoscopy                            was performed without difficulty. The patient                            tolerated the procedure well. The bowel preparation                            used was Miralax via split dose instruction. Scope In: 2:31:05 PM Scope Out: 2:42:12 PM Scope Withdrawal Time: 0 hours 7 minutes 31 seconds  Total Procedure Duration: 0 hours 11 minutes 7 seconds  Findings:                 The perianal and digital rectal examinations were                            normal.                           The colon (entire examined portion) appeared normal.                           No additional abnormalities were found on                            retroflexion. Complications:            No immediate complications.  Estimated blood loss:                            None. Estimated Blood Loss:     Estimated blood loss: none. Impression:               - The entire examined colon is normal.                           - No specimens collected. Recommendation:           - Repeat colonoscopy in 10 years for screening                            purposes.                           - Patient has a contact number available for                            emergencies. The signs and symptoms of potential                            delayed complications were discussed with the                            patient. Return to normal activities tomorrow.                            Written discharge instructions were provided to the                            patient.                           - Resume previous diet.                           - Continue  present medications. Gatha Mayer, MD 11/09/2020 2:47:51 PM This report has been signed electronically.

## 2020-11-09 NOTE — Progress Notes (Signed)
Pt's states no medical or surgical changes since previsit or office visit. 

## 2020-11-09 NOTE — Progress Notes (Signed)
Report given to PACU, vss 

## 2020-11-11 ENCOUNTER — Telehealth: Payer: Self-pay | Admitting: *Deleted

## 2020-11-11 NOTE — Telephone Encounter (Signed)
  Follow up Call-  Call back number 11/09/2020  Post procedure Call Back phone  # [phone]  Permission to leave phone message Yes  Some recent data might be hidden     Patient questions:  Do you have a fever, pain , or abdominal swelling? No. Pain Score  0 *  Have you tolerated food without any problems? Yes.    Have you been able to return to your normal activities? Yes.    Do you have any questions about your discharge instructions: Diet   No. Medications  No. Follow up visit  No.  Do you have questions or concerns about your Care? No.  Actions: * If pain score is 4 or above: No action needed, pain <4.  1. Have you developed a fever since your procedure? no  2.   Have you had an respiratory symptoms (SOB or cough) since your procedure? no  3.   Have you tested positive for COVID 19 since your procedure no  4.   Have you had any family members/close contacts diagnosed with the COVID 19 since your procedure?  no   If yes to any of these questions please route to Joylene John, RN and Joella Prince, RN

## 2020-11-24 ENCOUNTER — Encounter: Payer: Self-pay | Admitting: Family Medicine

## 2020-11-24 ENCOUNTER — Other Ambulatory Visit: Payer: Self-pay

## 2020-11-24 MED ORDER — FLOVENT HFA 110 MCG/ACT IN AERO: 2.0000 | INHALATION_SPRAY | Freq: Two times a day (BID) | RESPIRATORY_TRACT | 1 refills | Status: DC

## 2020-12-06 ENCOUNTER — Ambulatory Visit (INDEPENDENT_AMBULATORY_CARE_PROVIDER_SITE_OTHER): Payer: 59 | Admitting: *Deleted

## 2020-12-06 DIAGNOSIS — J309 Allergic rhinitis, unspecified: Secondary | ICD-10-CM

## 2021-01-02 ENCOUNTER — Ambulatory Visit (INDEPENDENT_AMBULATORY_CARE_PROVIDER_SITE_OTHER): Payer: 59 | Admitting: Obstetrics & Gynecology

## 2021-01-02 ENCOUNTER — Other Ambulatory Visit: Payer: Self-pay

## 2021-01-02 ENCOUNTER — Encounter (HOSPITAL_BASED_OUTPATIENT_CLINIC_OR_DEPARTMENT_OTHER): Payer: Self-pay | Admitting: Obstetrics & Gynecology

## 2021-01-02 VITALS — BP 121/72 | HR 73 | Ht 66.5 in | Wt 159.2 lb

## 2021-01-02 DIAGNOSIS — Z01419 Encounter for gynecological examination (general) (routine) without abnormal findings: Secondary | ICD-10-CM | POA: Diagnosis not present

## 2021-01-02 DIAGNOSIS — Z8041 Family history of malignant neoplasm of ovary: Secondary | ICD-10-CM

## 2021-01-02 DIAGNOSIS — Z9621 Cochlear implant status: Secondary | ICD-10-CM

## 2021-01-02 DIAGNOSIS — Z90710 Acquired absence of both cervix and uterus: Secondary | ICD-10-CM

## 2021-01-02 DIAGNOSIS — N870 Mild cervical dysplasia: Secondary | ICD-10-CM | POA: Diagnosis not present

## 2021-01-02 NOTE — Progress Notes (Signed)
45 y.o. G2P1 Divorced White or Caucasian female here for annual exam.  Doing well.  Just got back from trip to [place].  She is still dating same person.  This is going well.    Denies vaginal bleeding.    Has had some hot flashes.    Had right cochlear implant 05/2020.    No LMP recorded (lmp unknown). Patient has had a hysterectomy.          Sexually active: Yes.    The current method of family planning is status post hysterectomy.    Exercising: Yes.     Weights and body weight training Smoker:  no  Health Maintenance: Pap: 12/08/2018 neg with neg HR HPV History of abnormal Pap:  ASCUS pap with neg HR HPV 2018, neg pap with neg HR HPV 2019, negative pathology with hysterectomy 03/2018 MMG: 05/2020 Colonoscopy:11/09/2020 BMD:   not indicated TDaP:  09/2012 Pneumonia vaccine(s):  2021 and 2022 Shingrix:   discussed Hep C testing: 12/2018 Screening Labs: Dr. Mina Marble, PCP   reports that she has never smoked. She has never used smokeless tobacco. She reports current alcohol use of about 3.0 standard drinks of alcohol per week. She reports that she does not use drugs.  Past Medical History:  Diagnosis Date   Abnormal Pap smear    history of colposcopy ECC with atypical squamous metaplasia   Allergy    seasonal allergies   Anxiety    on meds   Arthritis    bilateral shoulders   Asthma    uses inhaler PRN   Bilateral hearing loss    hearing aid in right ear and cochlear implant in left ear   Bursitis of shoulder, right    Congenital hearing loss    started age 68 -    Depression    no meds   Endometriosis 10/10   Hx of migraines    increased frequency with estrogen containing OCPs   Panic attacks    Seasonal allergies     Past Surgical History:  Procedure Laterality Date   BREAST REDUCTION SURGERY  03/2009   COCHLEAR IMPLANT Left 2014   moval of Envoy device, right 05/2020   CYSTOSCOPY N/A 04/01/2018   Procedure: CYSTOSCOPY;  Surgeon: Megan Salon, MD;  Location: [facility];  Service: Gynecology;  Laterality: N/A;   IMPLANTATION OF ENVOY ESTEEM HEARING DEVICE     for hearing loss - now has been removed and replaced with cochlear implant 2014   IUD REMOVAL N/A 04/01/2018   Procedure: INTRAUTERINE DEVICE (IUD) REMOVAL  possible;  Surgeon: Megan Salon, MD;  Location: [facility];  Service: Gynecology;  Laterality: N/A;  possible   TOTAL LAPAROSCOPIC HYSTERECTOMY WITH SALPINGECTOMY Bilateral 04/01/2018   Procedure: TOTAL LAPAROSCOPIC HYSTERECTOMY WITH SALPINGECTOMY WITH EXCISION OF ENDOMETRIOSIS;  Surgeon: Megan Salon, MD;  Location: [facility];  Service: Gynecology;  Laterality: Bilateral;   TUBAL LIGATION  04/2009   cautery of endometriosis   WISDOM TOOTH EXTRACTION      Current Outpatient Medications  Medication Sig Dispense Refill   albuterol (PROAIR HFA) 108 (90 Base) MCG/ACT inhaler Inhale 1-2 puffs into the lungs every 4 (four) hours as needed for wheezing or shortness of breath. 54 g 0   ALPRAZolam (XANAX) 0.5 MG tablet Take 1 tablet (0.5 mg total) by mouth 2 (two) times daily as needed for anxiety. 20 tablet 0   Ascorbic Acid (VITAMIN C) 1000 MG tablet Take 1,000 mg by mouth daily.  Cholecalciferol (VITAMIN D) 2000 units tablet Take 2,000 Units by mouth daily.      ELDERBERRY PO Take 1 tablet by mouth daily.     Fexofenadine HCl (ALLEGRA ALLERGY PO) Take 1 tablet by mouth daily at 6 (six) AM.     fexofenadine-pseudoephedrine (ALLEGRA-D ALLERGY & CONGESTION) 60-120 MG 12 hr tablet Take 1 tablet by mouth 2 (two) times daily. (Patient taking differently: Take 1 tablet by mouth daily as needed.) 60 tablet 2   fluticasone (FLOVENT HFA) 110 MCG/ACT inhaler Inhale 2 puffs into the lungs 2 (two) times daily. 36 g 1   Fluticasone Propionate (XHANCE) 93 MCG/ACT EXHU Place 2 sprays into both nostrils in the morning and at bedtime. 32 mL 5   Magnesium 250 MG TABS 1 tablet daily at 6 (six) AM.     Multiple Vitamins-Minerals (MULTIVITAMIN GUMMIES ADULT PO) Take 1  each by mouth daily. Super food     ondansetron (ZOFRAN-ODT) 4 MG disintegrating tablet Take 4 mg by mouth every 8 (eight) hours as needed.     Probiotic Product (PROBIOTIC-10) CHEW Chew 1 each by mouth daily.      promethazine (PHENERGAN) 12.5 MG tablet 1-2 tablets every six hours as needed for nausea 30 tablet 0   No current facility-administered medications for this visit.    Family History  Problem Relation Age of Onset   Deafness Mother    Diabetes Mother    Heart Problems Mother        tachycardia   Ovarian cancer Mother 28   Deafness Father    Hypertension Father    Cirrhosis Father        non alcoholic   COPD Father    Hyperlipidemia Father    Down syndrome Sister    Diabetes Paternal Grandmother    Allergic rhinitis Neg Hx    Angioedema Neg Hx    Asthma Neg Hx    Eczema Neg Hx    Immunodeficiency Neg Hx    Urticaria Neg Hx    Colon polyps Neg Hx    Colon cancer Neg Hx    Esophageal cancer Neg Hx    Rectal cancer Neg Hx    Stomach cancer Neg Hx     Review of Systems  All other systems reviewed and are negative.  Exam:   BP 121/72 (BP Location: Right Arm, Patient Position: Sitting, Cuff Size: Small)   Pulse 73   Ht 5' 6.5" (1.689 m)   Wt 159 lb 3.2 oz (72.2 kg)   LMP  (LMP Unknown)   BMI 25.31 kg/m   Height: 5' 6.5" (168.9 cm)  General appearance: alert, cooperative and appears stated age Head: Normocephalic, without obvious abnormality, atraumatic Neck: no adenopathy, supple, symmetrical, trachea midline and thyroid normal to inspection and palpation Lungs: clear to auscultation bilaterally Breasts: normal appearance, no masses or tenderness Heart: regular rate and rhythm Abdomen: soft, non-tender; bowel sounds normal; no masses,  no organomegaly Extremities: extremities normal, atraumatic, no cyanosis or edema Skin: Skin color, texture, turgor normal. No rashes or lesions Lymph nodes: Cervical, supraclavicular, and axillary nodes normal. No abnormal  inguinal nodes palpated Neurologic: Grossly normal   Pelvic: External genitalia:  no lesions              Urethra:  normal appearing urethra with no masses, tenderness or lesions              Bartholins and Skenes: normal  Vagina: normal appearing vagina with normal color and no discharge, no lesions              Cervix: absent              Pap taken: No. Bimanual Exam:  Uterus:  uterus absent              Adnexa: no mass, fullness, tenderness               Rectovaginal: Confirms               Anus:  normal sphincter tone, no lesions  Chaperone, Octaviano Batty, CMA, was present for exam.  Assessment/Plan: 1. Well woman exam with routine gynecological exam - neg pap with neg HR HPV 2020 - MMG done 05/2020.  Will call [facility] to get copy. - vaccines updated - lab work done 01/2020 - colonoscopy 11/2020  2. History of cochlear implant  3. H/O: hysterectomy  4. Dysplasia of cervix, low grade (CIN 1)  5. Family history of ovarian cancer

## 2021-01-03 ENCOUNTER — Ambulatory Visit (INDEPENDENT_AMBULATORY_CARE_PROVIDER_SITE_OTHER): Payer: 59 | Admitting: *Deleted

## 2021-01-03 DIAGNOSIS — J309 Allergic rhinitis, unspecified: Secondary | ICD-10-CM | POA: Diagnosis not present

## 2021-01-12 ENCOUNTER — Encounter (HOSPITAL_BASED_OUTPATIENT_CLINIC_OR_DEPARTMENT_OTHER): Payer: Self-pay | Admitting: Obstetrics & Gynecology

## 2021-02-10 ENCOUNTER — Ambulatory Visit: Payer: 59

## 2021-02-20 ENCOUNTER — Encounter: Payer: Self-pay | Admitting: Family Medicine

## 2021-03-02 ENCOUNTER — Ambulatory Visit (INDEPENDENT_AMBULATORY_CARE_PROVIDER_SITE_OTHER): Payer: 59 | Admitting: *Deleted

## 2021-03-02 DIAGNOSIS — J309 Allergic rhinitis, unspecified: Secondary | ICD-10-CM | POA: Diagnosis not present

## 2021-03-06 ENCOUNTER — Other Ambulatory Visit: Payer: Self-pay

## 2021-03-06 MED ORDER — XHANCE 93 MCG/ACT NA EXHU
2.0000 | INHALANT_SUSPENSION | Freq: Two times a day (BID) | NASAL | 5 refills | Status: DC
Start: 2021-03-06 — End: 2021-03-08

## 2021-03-08 ENCOUNTER — Other Ambulatory Visit: Payer: Self-pay

## 2021-03-08 MED ORDER — XHANCE 93 MCG/ACT NA EXHU
2.0000 | INHALANT_SUSPENSION | Freq: Two times a day (BID) | NASAL | 5 refills | Status: DC
Start: 2021-03-08 — End: 2021-05-04

## 2021-03-09 ENCOUNTER — Ambulatory Visit (INDEPENDENT_AMBULATORY_CARE_PROVIDER_SITE_OTHER): Payer: 59 | Admitting: *Deleted

## 2021-03-09 DIAGNOSIS — J309 Allergic rhinitis, unspecified: Secondary | ICD-10-CM

## 2021-03-28 DIAGNOSIS — J3089 Other allergic rhinitis: Secondary | ICD-10-CM | POA: Diagnosis not present

## 2021-03-28 NOTE — Progress Notes (Signed)
VIALS MADE. EXP 03-28-22

## 2021-04-04 DIAGNOSIS — J302 Other seasonal allergic rhinitis: Secondary | ICD-10-CM | POA: Diagnosis not present

## 2021-04-06 ENCOUNTER — Ambulatory Visit (INDEPENDENT_AMBULATORY_CARE_PROVIDER_SITE_OTHER): Payer: 59

## 2021-04-06 DIAGNOSIS — J309 Allergic rhinitis, unspecified: Secondary | ICD-10-CM | POA: Diagnosis not present

## 2021-05-03 ENCOUNTER — Ambulatory Visit (INDEPENDENT_AMBULATORY_CARE_PROVIDER_SITE_OTHER): Payer: 59

## 2021-05-03 DIAGNOSIS — J309 Allergic rhinitis, unspecified: Secondary | ICD-10-CM | POA: Diagnosis not present

## 2021-05-04 ENCOUNTER — Ambulatory Visit: Payer: 59 | Admitting: Allergy

## 2021-05-04 ENCOUNTER — Encounter: Payer: Self-pay | Admitting: Allergy

## 2021-05-04 ENCOUNTER — Other Ambulatory Visit: Payer: Self-pay

## 2021-05-04 VITALS — BP 130/78 | HR 87 | Temp 98.4°F | Resp 16 | Ht 65.35 in | Wt 158.6 lb

## 2021-05-04 DIAGNOSIS — H1013 Acute atopic conjunctivitis, bilateral: Secondary | ICD-10-CM

## 2021-05-04 DIAGNOSIS — J302 Other seasonal allergic rhinitis: Secondary | ICD-10-CM

## 2021-05-04 DIAGNOSIS — J4540 Moderate persistent asthma, uncomplicated: Secondary | ICD-10-CM

## 2021-05-04 DIAGNOSIS — J3089 Other allergic rhinitis: Secondary | ICD-10-CM

## 2021-05-04 DIAGNOSIS — H1010 Acute atopic conjunctivitis, unspecified eye: Secondary | ICD-10-CM

## 2021-05-04 MED ORDER — FLUTICASONE PROPIONATE HFA 110 MCG/ACT IN AERO: 2.0000 | INHALATION_SPRAY | Freq: Two times a day (BID) | RESPIRATORY_TRACT | 1 refills | Status: DC

## 2021-05-04 MED ORDER — OLOPATADINE HCL 0.2 % OP SOLN: 1.0000 [drp] | Freq: Every day | OPHTHALMIC | 5 refills | Status: DC | PRN

## 2021-05-04 MED ORDER — ALBUTEROL SULFATE HFA 108 (90 BASE) MCG/ACT IN AERS: 1.0000 | INHALATION_SPRAY | RESPIRATORY_TRACT | 1 refills | Status: DC | PRN

## 2021-05-04 MED ORDER — XHANCE 93 MCG/ACT NA EXHU: 2.0000 | INHALANT_SUSPENSION | Freq: Two times a day (BID) | NASAL | 1 refills | Status: DC

## 2021-05-04 NOTE — Progress Notes (Signed)
Follow-up Note  RE: Margaret Ford MRN: [id number] DOB: 11-06-1975 Date of Office Visit: 05/04/2021   History of present illness: Margaret Ford is a 45 y.o. female presenting today for follow-up of asthma, allergic rhinitis with conjunctivitis.  She was last seen in the office on 10/19/2020 by our nurse practitioner Ambs.  She states she has been doing well since her last visit without any major health changes, surgeries or hospitalizations.    She can tolerate being outside now more than she used to.  She does feel like her allergy shots have been helpful.   She does report when the seasons change she does note watery eyes, sneezing, cough and sinus headaches but she states is more tolerable than before.  She alternates between xyzal and allegra daily.  She does Xhance nasal spray most days.  She has not tried yet to see if she still needs as much antihistamine now.   She states the one thing she knows that will [place]    She uses flovent 2 puffs twice a day.  She states she hasn't needed to use albuterol as she states she has not been exercising.  No ED/UC visits for asthma flare or systemic steroid needs.   She states she did go to ED in August with back pain and tested negative for covid and was told she had a sinus infection and was treated with antibiotic and prednisone. She received toradol for her back pain.   Review of systems in the past 4 weeks: Review of Systems  Constitutional: Negative.   HENT: Negative.    Eyes: Negative.   Respiratory: Negative.    Cardiovascular: Negative.   Gastrointestinal: Negative.   Musculoskeletal: Negative.   Skin: Negative.   Neurological: Negative.    All other systems negative unless noted above in HPI  Past medical/social/surgical/family history have been reviewed and are unchanged unless specifically indicated below.  No changes  Medication List: Current Outpatient  Medications  Medication Sig Dispense Refill   albuterol (PROAIR HFA) 108 (90 Base) MCG/ACT inhaler Inhale 1-2 puffs into the lungs every 4 (four) hours as needed for wheezing or shortness of breath. 54 g 0   ALPRAZolam (XANAX) 0.5 MG tablet Take 1 tablet (0.5 mg total) by mouth 2 (two) times daily as needed for anxiety. 20 tablet 0   Ascorbic Acid (VITAMIN C) 1000 MG tablet Take 1,000 mg by mouth daily.     Cholecalciferol (VITAMIN D) 2000 units tablet Take 2,000 Units by mouth daily.      ELDERBERRY PO Take 1 tablet by mouth daily.     Fexofenadine HCl (ALLEGRA ALLERGY PO) Take 1 tablet by mouth daily at 6 (six) AM.     fexofenadine-pseudoephedrine (ALLEGRA-D ALLERGY & CONGESTION) 60-120 MG 12 hr tablet Take 1 tablet by mouth 2 (two) times daily. (Patient taking differently: Take 1 tablet by mouth daily as needed.) 60 tablet 2   fluticasone (FLOVENT HFA) 110 MCG/ACT inhaler Inhale 2 puffs into the lungs 2 (two) times daily. 36 g 1   Fluticasone Propionate (XHANCE) 93 MCG/ACT EXHU Place 2 sprays into both nostrils in the morning and at bedtime. 32 mL 5   lidocaine (LIDODERM) 5 % 1 patch daily.     Magnesium 250 MG TABS 1 tablet daily at 6 (six) AM.     meloxicam (MOBIC) 7.5 MG tablet Take 7.5 mg by mouth daily.     Multiple Vitamins-Minerals (  MULTIVITAMIN GUMMIES ADULT PO) Take 1 each by mouth daily. Super food     Olopatadine HCl 0.2 % SOLN olopatadine 0.2 % eye drops     ondansetron (ZOFRAN-ODT) 4 MG disintegrating tablet Take 4 mg by mouth every 8 (eight) hours as needed.     pantoprazole (PROTONIX) 40 MG tablet Take 1 tablet by mouth daily.     Probiotic Product (PROBIOTIC-10) CHEW Chew 1 each by mouth daily.      promethazine (PHENERGAN) 12.5 MG tablet 1-2 tablets every six hours as needed for nausea 30 tablet 0   No current facility-administered medications for this visit.     Known medication allergies: Allergies  Allergen Reactions   Shrimp Extract Allergy Skin Test Nausea And  Vomiting and Shortness Of Breath    Other reaction(s): Wheezing Other reaction(s): Wheezing   Codeine Other (See Comments)    "sick" - ok to take with nausea med Other reaction(s): Vomiting   Augmentin [Amoxicillin-Pot Clavulanate] Diarrhea   Ciprofloxacin Diarrhea   Shrimp [Shellfish Allergy]    Sulfa Antibiotics Other (See Comments)    Generalized burning     Physical examination: Blood pressure 130/78, pulse 87, temperature 98.4 F (36.9 C), temperature source Temporal, resp. rate 16, height 5' 5.35" (1.66 m), weight 158 lb 9.6 oz (71.9 kg), SpO2 96 %.  General: Alert, interactive, in no acute distress. HEENT: PERRLA, TMs pearly gray, turbinates non-edematous without discharge, post-pharynx non erythematous. Neck: Supple without lymphadenopathy. Lungs: Clear to auscultation without wheezing, rhonchi or rales. {no increased work of breathing. CV: Normal S1, S2 without murmurs. Abdomen: Nondistended, nontender. Skin: Warm and dry, without lesions or rashes. Extremities:  No clubbing, cyanosis or edema. Neuro:   Grossly intact.  Diagnositics/Labs: None today  Assessment and plan:   Asthma Continue Flovent 110-2 puffs twice a day with a spacer to prevent cough or wheeze Continue albuterol 2 puffs once every 4 hours as needed for cough or wheeze You may use albuterol 2 puffs 5 to 15 minutes before exercise to decrease cough or wheeze  For asthma flare, begin Flovent 110-3 puffs 3 times a day for 2 weeks or until cough and wheeze free  Asthma control goals:  Full participation in all desired activities (may need albuterol before activity) Albuterol use two time or less a week on average (not counting use with activity) Cough interfering with sleep two time or less a month Oral steroids no more than once a year No hospitalizations   Allergic rhinitis Continue allergen avoidance measures  Continue allergen immunotherapy and have access to an epinephrine auto-injector.   Recommend we try to see if you still need your antihistamine and nasal spray on a consistent basis.  Try to wean antihistamine (Xyzal or Allegra) and nasal spray (Xhance) to every other day for several weeks and if no increase in symptoms then decrease to twice a week dosing and if no increase in symptoms then go to as needed use.   If at any time you have increase in allergy symptoms then can increase use Continue nasal saline mist as you have been  Allergic conjunctivitis Some over the counter eye drops include Pataday one drop in each eye once a day as needed for red, itchy eyes OR Zaditor one drop in each eye twice a day as needed for red itchy eyes.  Call the clinic if this treatment plan is not working well for you  Follow up in 6 months or sooner if needed.  I appreciate the opportunity  to take part in Pina's care. Please do not hesitate to contact me with questions.  Sincerely,   Prudy Feeler, MD Allergy/Immunology [facility]

## 2021-05-04 NOTE — Patient Instructions (Addendum)
Asthma Continue Flovent 110-2 puffs twice a day with a spacer to prevent cough or wheeze Continue albuterol 2 puffs once every 4 hours as needed for cough or wheeze You may use albuterol 2 puffs 5 to 15 minutes before exercise to decrease cough or wheeze  For asthma flare, begin Flovent 110-3 puffs 3 times a day for 2 weeks or until cough and wheeze free  Asthma control goals:  Full participation in all desired activities (may need albuterol before activity) Albuterol use two time or less a week on average (not counting use with activity) Cough interfering with sleep two time or less a month Oral steroids no more than once a year No hospitalizations   Allergic rhinitis Continue allergen avoidance measures  Continue allergen immunotherapy and have access to an epinephrine auto-injector.  Recommend we try to see if you still need your antihistamine and nasal spray on a consistent basis.  Try to wean antihistamine (Xyzal or Allegra) and nasal spray (Xhance) to every other day for several weeks and if no increase in symptoms then decrease to twice a week dosing and if no increase in symptoms then go to as needed use.   If at any time you have increase in allergy symptoms then can increase use Continue nasal saline mist as you have been  Allergic conjunctivitis Some over the counter eye drops include Pataday one drop in each eye once a day as needed for red, itchy eyes OR Zaditor one drop in each eye twice a day as needed for red itchy eyes.  Call the clinic if this treatment plan is not working well for you  Follow up in 6 months or sooner if needed.

## 2021-05-30 ENCOUNTER — Ambulatory Visit (INDEPENDENT_AMBULATORY_CARE_PROVIDER_SITE_OTHER): Payer: 59

## 2021-05-30 DIAGNOSIS — J309 Allergic rhinitis, unspecified: Secondary | ICD-10-CM | POA: Diagnosis not present

## 2021-05-30 MED ORDER — EPINEPHRINE 0.3 MG/0.3ML IJ SOAJ: 0.3000 mg | INTRAMUSCULAR | 1 refills | Status: AC | PRN

## 2021-06-08 ENCOUNTER — Ambulatory Visit (INDEPENDENT_AMBULATORY_CARE_PROVIDER_SITE_OTHER): Payer: 59 | Admitting: *Deleted

## 2021-06-08 DIAGNOSIS — J309 Allergic rhinitis, unspecified: Secondary | ICD-10-CM | POA: Diagnosis not present

## 2021-06-16 ENCOUNTER — Encounter: Payer: Self-pay | Admitting: Family Medicine

## 2021-06-16 ENCOUNTER — Encounter: Payer: Self-pay | Admitting: Allergy

## 2021-06-16 ENCOUNTER — Telehealth: Payer: Self-pay | Admitting: Family Medicine

## 2021-06-16 ENCOUNTER — Ambulatory Visit: Payer: 59 | Admitting: Family Medicine

## 2021-06-16 ENCOUNTER — Other Ambulatory Visit: Payer: Self-pay

## 2021-06-16 VITALS — BP 138/78 | HR 99 | Temp 97.9°F | Resp 16 | Ht 65.0 in | Wt 159.8 lb

## 2021-06-16 DIAGNOSIS — K219 Gastro-esophageal reflux disease without esophagitis: Secondary | ICD-10-CM

## 2021-06-16 DIAGNOSIS — H1010 Acute atopic conjunctivitis, unspecified eye: Secondary | ICD-10-CM | POA: Diagnosis not present

## 2021-06-16 DIAGNOSIS — J4540 Moderate persistent asthma, uncomplicated: Secondary | ICD-10-CM | POA: Diagnosis not present

## 2021-06-16 DIAGNOSIS — J3089 Other allergic rhinitis: Secondary | ICD-10-CM

## 2021-06-16 DIAGNOSIS — H6981 Other specified disorders of Eustachian tube, right ear: Secondary | ICD-10-CM

## 2021-06-16 DIAGNOSIS — J302 Other seasonal allergic rhinitis: Secondary | ICD-10-CM

## 2021-06-16 DIAGNOSIS — H6991 Unspecified Eustachian tube disorder, right ear: Secondary | ICD-10-CM | POA: Insufficient documentation

## 2021-06-16 MED ORDER — LEVOCETIRIZINE DIHYDROCHLORIDE 5 MG PO TABS: 5.0000 mg | ORAL_TABLET | Freq: Every evening | ORAL | 5 refills | Status: DC

## 2021-06-16 NOTE — Telephone Encounter (Signed)
Called Margaret Ford and informed pt on how to use the digihaler she stated her understanding

## 2021-06-16 NOTE — Progress Notes (Signed)
[place] [address] Dept: [phone]  FOLLOW UP NOTE  Patient ID: Ferne Reus, female    DOB: 1976/05/13  Age: 45 y.o. 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  She was last seen in this clinic on 05/04/2021 by Dr. Nelva Bush for evaluation of asthma, allergic rhinitis, and allergic conjunctivitis.  In the interim, she reports that she went back to work at the office in late August.  At today's visit, she reports her asthma has been poorly controlled with symptoms including shortness of breath which is worse with activity, wheezing, and dry cough.  She reports the symptoms have been present since she returned to work in August, however, and worsened this morning.  She also began to experience slight chest tightness this morning.  She continues Flovent 110 2 puffs twice a day with a spacer and albuterol about 2 times a week.  Allergic rhinitis is reported as poorly controlled over the last several months with symptoms including clear rhinorrhea, occasional nasal congestion, sneezing, and postnasal drainage.  She continues Allegra 180 mg once a day, Xhance 2 sprays in each nostril twice a day, and saline rinses daily.  She continues allergen immunotherapy with no large or local reactions.  She reports a significant decrease in her symptoms of allergic rhinitis while continuing on allergen immunotherapy.  Allergic conjunctivitis is reported as moderately well controlled with occasional red and itchy eyes for which she uses olopatadine with relief of symptoms.  Reflux is reported as moderately well controlled with heartburn occurring over the last couple of days as she has missed taking  her pantoprazole for the last couple of days.  Otherwise, she reports reflux has been well controlled.  She does report intermittent ache in her right ear that began about 1 week ago.  She denies fever, sweats, chills, and sick contacts.  She denies any pressure or popping in her right ear.  Her current medications are listed in the chart. Of note, she is requesting a letter from our office regarding her allergies and exposure at her workplace.  Drug Allergies:  Allergies  Allergen Reactions   Shrimp Extract Allergy Skin Test Nausea And Vomiting and Shortness Of Breath    Other reaction(s): Wheezing Other reaction(s): Wheezing   Codeine Other (See Comments)    "sick" - ok to take with nausea med Other reaction(s): Vomiting   Augmentin [Amoxicillin-Pot Clavulanate] Diarrhea   Ciprofloxacin Diarrhea   Shrimp [Shellfish Allergy]    Sulfa Antibiotics Other (See Comments)    Generalized burning    Physical Exam: BP 138/78   Pulse 99   Temp 97.9 F (36.6 C) (Temporal)   Resp 16   Ht 5\' 5"  (1.651 m)   Wt 159 lb 12.8 oz (72.5 kg)   LMP  (LMP Unknown)   SpO2 99%   BMI 26.59 kg/m    Physical Exam Vitals reviewed.  Constitutional:      Appearance: Normal appearance.  HENT:     Head: Normocephalic and atraumatic.     Right Ear: Tympanic membrane normal.     Left Ear: Tympanic membrane normal.     Nose:     Comments: Bilateral nares slightly erythematous with clear nasal  drainage noted.  Pharynx slightly erythematous with no exudate.  Ears normal.  Eyes normal.  Cochlear implants Eyes:     Conjunctiva/sclera: Conjunctivae normal.  Cardiovascular:     Rate and Rhythm: Normal rate and regular rhythm.     Heart sounds: Normal heart sounds. No murmur heard. Pulmonary:     Effort: Pulmonary effort is normal.     Breath sounds: Normal breath sounds.     Comments: Lungs clear to auscultation Musculoskeletal:        General: Normal range of motion.     Cervical back: Normal range of  motion and neck supple.  Skin:    General: Skin is warm and dry.  Neurological:     Mental Status: She is alert and oriented to person, place, and time.  Psychiatric:        Mood and Affect: Mood normal.        Behavior: Behavior normal.        Thought Content: Thought content normal.        Judgment: Judgment normal.    Diagnostics: FVC 3.87, FEV1 3.10.  Predicted FVC 3.68, predicted FEV1 2.96.  Spirometry indicates normal ventilatory function.  Assessment and Plan: 1. Moderate persistent asthma without complication   2. Seasonal and perennial allergic rhinitis   3. Seasonal allergic conjunctivitis   4. Gastroesophageal reflux disease, unspecified whether esophagitis present   5. Dysfunction of right eustachian tube     Meds ordered this encounter  Medications   levocetirizine (XYZAL) 5 MG tablet    Sig: Take 1 tablet (5 mg total) by mouth every evening.    Dispense:  30 tablet    Refill:  5     Patient Instructions  Asthma Prednisone 10 mg tablets. Take 2 tablets once a day for 4 days, then take 1 tablet on the 5th day, then stop.   Begin AirDuo 113-1 puff twice a day with a spacer to prevent cough or wheeze. This will replace Flovent 110 Continue albuterol 2 puffs once every 4 hours as needed for cough or wheeze You may use albuterol 2 puffs 5 to 15 minutes before exercise to decrease cough or wheeze   Allergic rhinitis Continue allergen avoidance measures directed toward pollen, mold, and dust mites Continue allergen immunotherapy and have access to an epinephrine auto-injector. Continue Xyzal 5 mg once a day as needed for runny nose or itch.Remember to rotate to a different antihistamine about every 3 months. Some examples of over the counter antihistamines include Zyrtec (cetirizine), Xyzal (levocetirizine), Allegra (fexofenadine), and Claritin (loratidine).  Continue XHANCE 2 sprays in each nostril twice a day for nasal symptoms Continue nasal saline mist as you  have been  Allergic conjunctivitis Some over the counter eye drops include Pataday one drop in each eye once a day as needed for red, itchy eyes OR Zaditor one drop in each eye twice a day as needed for red itchy eyes.  Reflux Continue dietary and lifestyle modifications as listed below Continue pantoprazole as previously prescribed Call the clinic if this treatment plan is not working well for you  Follow up in 1 month or sooner if needed.   Return in about 4 weeks (around 07/14/2021), or if symptoms worsen or fail to improve.    Thank you for the opportunity to care for this patient.  Please do not hesitate to contact me with questions.  Gareth Morgan, FNP [facility]

## 2021-06-16 NOTE — Addendum Note (Signed)
Addended by: Eloy End D on: 06/16/2021 04:39 PM   Modules accepted: Orders

## 2021-06-16 NOTE — Patient Instructions (Addendum)
Asthma Prednisone 10 mg tablets. Take 2 tablets once a day for 4 days, then take 1 tablet on the 5th day, then stop.   Begin AirDuo 113-1 puff twice a day with a spacer to prevent cough or wheeze. This will replace Flovent 110 Continue albuterol 2 puffs once every 4 hours as needed for cough or wheeze You may use albuterol 2 puffs 5 to 15 minutes before exercise to decrease cough or wheeze   Allergic rhinitis Continue allergen avoidance measures directed toward pollen, mold, and dust mites Continue allergen immunotherapy and have access to an epinephrine auto-injector. Continue Xyzal 5 mg once a day as needed for runny nose or itch.Remember to rotate to a different antihistamine about every 3 months. Some examples of over the counter antihistamines include Zyrtec (cetirizine), Xyzal (levocetirizine), Allegra (fexofenadine), and Claritin (loratidine).  Continue XHANCE 2 sprays in each nostril twice a day for nasal symptoms Continue nasal saline mist as you have been  Allergic conjunctivitis Some over the counter eye drops include Pataday one drop in each eye once a day as needed for red, itchy eyes OR Zaditor one drop in each eye twice a day as needed for red itchy eyes.  Reflux Continue dietary and lifestyle modifications as listed below Continue pantoprazole as previously prescribed Call the clinic if this treatment plan is not working well for you  Follow up in 1 month or sooner if needed.  Reducing Pollen Exposure The American Academy of Allergy, Asthma and Immunology suggests the following steps to reduce your exposure to pollen during allergy seasons. Do not hang sheets or clothing out to dry; pollen may collect on these items. Do not mow lawns or spend time around freshly cut grass; mowing stirs up pollen. Keep windows closed at night.  Keep car windows closed while driving. Minimize morning activities outdoors, a time when pollen counts are usually at their highest. Stay indoors  as much as possible when pollen counts or humidity is high and on windy days when pollen tends to remain in the air longer. Use air conditioning when possible.  Many air conditioners have filters that trap the pollen spores. Use a HEPA room air filter to remove pollen form the indoor air you breathe.  Control of Mold Allergen Mold and fungi can grow on a variety of surfaces provided certain temperature and moisture conditions exist.  Outdoor molds grow on plants, decaying vegetation and soil.  The major outdoor mold, Alternaria and Cladosporium, are found in very high numbers during hot and dry conditions.  Generally, a late Summer - Fall peak is seen for common outdoor fungal spores.  Rain will temporarily lower outdoor mold spore count, but counts rise rapidly when the rainy period ends.  The most important indoor molds are Aspergillus and Penicillium.  Dark, humid and poorly ventilated basements are ideal sites for mold growth.  The next most common sites of mold growth are the bathroom and the kitchen.  Outdoor [employer] Use air conditioning and keep windows closed Avoid exposure to decaying vegetation. Avoid leaf raking. Avoid grain handling. Consider wearing a face mask if working in moldy areas.  Indoor Mold Control Maintain humidity below 50%. Clean washable surfaces with 5% bleach solution. Remove sources e.g. Contaminated carpets.   Control of Dust Mite Allergen Dust mites play a major role in allergic asthma and rhinitis. They occur in environments with high humidity wherever human skin is found. Dust mites absorb humidity from the atmosphere (ie, they do not drink) and  feed on organic matter (including shed human and animal skin). Dust mites are a microscopic type of insect that you cannot see with the naked eye. High levels of dust mites have been detected from mattresses, pillows, carpets, upholstered furniture, bed covers, clothes, soft toys and any woven material. The  principal allergen of the dust mite is found in its feces. A gram of dust may contain 1,000 mites and 250,000 fecal particles. Mite antigen is easily measured in the air during house cleaning activities. Dust mites do not bite and do not cause harm to humans, other than by triggering allergies/asthma.  Ways to decrease your exposure to dust mites in your home:  1. Encase mattresses, box springs and pillows with a mite-impermeable barrier or cover  2. Wash sheets, blankets and drapes weekly in hot water (130 F) with detergent and dry them in a dryer on the hot setting.  3. Have the room cleaned frequently with a vacuum cleaner and a damp dust-mop. For carpeting or rugs, vacuuming with a vacuum cleaner equipped with a high-efficiency particulate air (HEPA) filter. The dust mite allergic individual should not be in a room which is being cleaned and should wait 1 hour after cleaning before going into the room.  4. Do not sleep on upholstered furniture (eg, couches).  5. If possible removing carpeting, upholstered furniture and drapery from the home is ideal. Horizontal blinds should be eliminated in the rooms where the person spends the most time (bedroom, study, television room). Washable vinyl, roller-type shades are optimal.  6. Remove all non-washable stuffed toys from the bedroom. Wash stuffed toys weekly like sheets and blankets above.  7. Reduce indoor humidity to less than 50%. Inexpensive humidity monitors can be purchased at most hardware stores. Do not use a humidifier as can make the problem worse and are not recommended.

## 2021-06-16 NOTE — Telephone Encounter (Signed)
Can you please call this patient and walk her through how to use the AirDuo inhaler please. Have her call the clinic if she has any trouble with this and we can call in a different duel inhaler. Thank you

## 2021-06-16 NOTE — Telephone Encounter (Signed)
Patient is wondering if there is alternative to the AirDuo inhaler she received today. She's used to having a spacer and she feels like it is not working too well.    Best pharmacy- [facility] [address]

## 2021-06-16 NOTE — Telephone Encounter (Signed)
Please see mychart message I have called pt on phone and informed her on how to use the digihaler pt stated her understanding

## 2021-06-22 NOTE — Telephone Encounter (Addendum)
Pt stated understanding to the flovent and airduo are similar. Breathing is okay she believes the last episode was an allergic reaction. she is getting over a cold and she is not having any chest tightness. Pt does want to know why she was changed from flovent to airduo?

## 2021-06-22 NOTE — Telephone Encounter (Signed)
The steroid content in AirDuo 113 should be similar to Flovent 110 which she had been using. Can you please ask how she is breathing? And if she has any chest tightness still? Thank you

## 2021-06-22 NOTE — Telephone Encounter (Signed)
Pt stated understanding and will try the flovent and stopping the allegra and then give [place] and update in a few days

## 2021-06-22 NOTE — Telephone Encounter (Signed)
Thank you for checking in on this patient. At her last visit, she reported symptoms consistent with not well controlled asthma and her asthma maintenance medication was changed from [place] (a single medication inhaler) to AirDuo (2 asthma medications in one inhaler) for better asthma control. If her breathing has returned to baseline, she can return to Flovent 110-2 puffs twice a day with a spacer and stop Air Duo. As to mouth dryness/thirst- this can occur for several reasons including mouth breathing and fever among many other causes. If this continues after the switch beck to Flovent 110, please have her call our clinic and her PCP for further evaluation. Please encourage her to continue with nasal saline rinses and steroid spray to keep her nostrils clear to help decrease mouth breathing. She can also stop Allegra 180 mg for a few days as this can be very drying.  She should wait until she is feeling back to her baseline before restarting allergy injections. She should restart Allegra 180 mg at least 1 day before getting her next injection. Thank you. Please have her call the clinic with any further questions.

## 2021-06-22 NOTE — Telephone Encounter (Signed)
Patient states she has been using her AirDuo Inhaler and has been experiencing extreme thirst. Patient is wondering if she could try something else or should she keep using the AirDuo.   Please advise.   [facility]   Best contact number: [phone]

## 2021-06-26 ENCOUNTER — Other Ambulatory Visit: Payer: Self-pay | Admitting: *Deleted

## 2021-06-26 ENCOUNTER — Other Ambulatory Visit: Payer: Self-pay

## 2021-06-26 MED ORDER — XHANCE 93 MCG/ACT NA EXHU: 2.0000 | INHALANT_SUSPENSION | Freq: Two times a day (BID) | NASAL | 5 refills | Status: DC

## 2021-06-27 ENCOUNTER — Ambulatory Visit (INDEPENDENT_AMBULATORY_CARE_PROVIDER_SITE_OTHER): Payer: 59 | Admitting: *Deleted

## 2021-06-27 DIAGNOSIS — J309 Allergic rhinitis, unspecified: Secondary | ICD-10-CM | POA: Diagnosis not present

## 2021-06-28 ENCOUNTER — Encounter: Payer: Self-pay | Admitting: Family Medicine

## 2021-06-28 ENCOUNTER — Other Ambulatory Visit: Payer: Self-pay | Admitting: *Deleted

## 2021-06-28 MED ORDER — SYMBICORT 160-4.5 MCG/ACT IN AERO: 2.0000 | INHALATION_SPRAY | Freq: Two times a day (BID) | RESPIRATORY_TRACT | 5 refills | Status: DC

## 2021-06-28 NOTE — Telephone Encounter (Signed)
Can you please have her begin Symbicort 160-2 puffs twice a day with a spacer for her breathing? This will replace AirDuo 113 and Flovent 110. Thank you. Can you please call her tomorrow to make sure she got the inhaler and she is comfortable with using it? Thank you

## 2021-07-12 ENCOUNTER — Encounter (HOSPITAL_BASED_OUTPATIENT_CLINIC_OR_DEPARTMENT_OTHER): Payer: Self-pay | Admitting: *Deleted

## 2021-07-14 ENCOUNTER — Other Ambulatory Visit: Payer: Self-pay

## 2021-07-14 ENCOUNTER — Other Ambulatory Visit: Payer: Self-pay | Admitting: *Deleted

## 2021-07-14 MED ORDER — XHANCE 93 MCG/ACT NA EXHU: 2.0000 | INHALANT_SUSPENSION | Freq: Two times a day (BID) | NASAL | 5 refills | Status: DC

## 2021-07-28 ENCOUNTER — Other Ambulatory Visit: Payer: Self-pay

## 2021-07-28 ENCOUNTER — Ambulatory Visit: Payer: 59 | Admitting: Family Medicine

## 2021-07-28 ENCOUNTER — Encounter: Payer: Self-pay | Admitting: Family Medicine

## 2021-07-28 ENCOUNTER — Ambulatory Visit: Payer: Self-pay

## 2021-07-28 VITALS — BP 122/70 | HR 90 | Temp 98.5°F | Resp 16 | Ht 65.5 in | Wt 161.2 lb

## 2021-07-28 DIAGNOSIS — J309 Allergic rhinitis, unspecified: Secondary | ICD-10-CM | POA: Diagnosis not present

## 2021-07-28 DIAGNOSIS — J4540 Moderate persistent asthma, uncomplicated: Secondary | ICD-10-CM

## 2021-07-28 DIAGNOSIS — H1013 Acute atopic conjunctivitis, bilateral: Secondary | ICD-10-CM

## 2021-07-28 DIAGNOSIS — K219 Gastro-esophageal reflux disease without esophagitis: Secondary | ICD-10-CM | POA: Diagnosis not present

## 2021-07-28 DIAGNOSIS — J302 Other seasonal allergic rhinitis: Secondary | ICD-10-CM

## 2021-07-28 DIAGNOSIS — J3089 Other allergic rhinitis: Secondary | ICD-10-CM

## 2021-07-28 DIAGNOSIS — H1010 Acute atopic conjunctivitis, unspecified eye: Secondary | ICD-10-CM

## 2021-07-28 MED ORDER — SPIRIVA RESPIMAT 1.25 MCG/ACT IN AERS: 2.0000 | INHALATION_SPRAY | Freq: Every day | RESPIRATORY_TRACT | 1 refills | Status: DC

## 2021-07-28 MED ORDER — FAMOTIDINE 20 MG PO TABS: 20.0000 mg | ORAL_TABLET | Freq: Two times a day (BID) | ORAL | 5 refills | Status: DC

## 2021-07-28 NOTE — Progress Notes (Signed)
[name] [address] Dept: [phone]  FOLLOW UP NOTE  Patient ID: Margaret Ford, female    DOB: 10/03/1975  Age: 46 y.o. MRN: [id number] Date of Office Visit: 07/28/2021  Assessment  Chief Complaint: Asthma (ACT 15/Waking up in the morning coughing. Waking up at night pain in chest. ) and Allergic Rhinitis  (Cold for a couple weeks since the beginning of the month. Today is the first day that she felt better.)  HPI Margaret Ford is a 46 year old female who presents to the clinic for follow-up visit.  She was last seen in this clinic on 06/16/2021 by Gareth Morgan, FNP, for evaluation of asthma, allergic rhinitis on allergen immunotherapy, allergic conjunctivitis, reflux, and eustachian tube dysfunction.  In the interim, she reports that she has had a cold for the last 3 weeks with symptoms including nasal congestion, sneezing, and runny nose.  She denies fever with this cold and reports that many family members have also had this cold.  At today's visit, she reports that her asthma has been not well controlled with symptoms including shortness of breath with activity for the last 3 weeks, wheezing, and dry cough occurring in the daytime.  She continues Symbicort 160-2 puffs twice a day and has been using albuterol 1-2 times a day a couple days a week over the last 3 weeks with relief of symptoms.  She reports a new symptom of feeling shortness of breath and chest tightness with strong odors specifically perfume.   Of note, her absolute eosinophil count on 12/15/2020 was 0.7.  Allergic rhinitis is reported as moderately well controlled with symptoms including rhinorrhea, nasal congestion, sneezing, and postnasal drainage.  She continues Allegra 180 mg once a day, Xhance 2 sprays in each nostril twice a day, and saline nasal rinses daily.  She continues allergen immunotherapy with no large or local reactions.  She reports a significant decrease in her symptoms of allergic  rhinitis while continuing on allergen immunotherapy. She began allergen immunotherapy directed toward pollen, mold, and dust mite on 10/28/2017. Allergic conjunctivitis is reported as moderately well controlled with occasional red and itchy eyes for which he uses olopatadine with relief of symptoms.  Reflux is reported as poorly controlled with heartburn occurring about 2 out of 7 days of the week.  She continues pantoprazole as previously prescribed.  Her current medications are listed in the chart.   Drug Allergies:  Allergies  Allergen Reactions   Shrimp Extract Allergy Skin Test Nausea And Vomiting and Shortness Of Breath    Other reaction(s): Wheezing Other reaction(s): Wheezing   Codeine Other (See Comments)    "sick" - ok to take with nausea med Other reaction(s): Vomiting   Augmentin [Amoxicillin-Pot Clavulanate] Diarrhea   Ciprofloxacin Diarrhea   Shrimp [Shellfish Allergy]    Sulfa Antibiotics Other (See Comments)    Generalized burning    Physical Exam: BP 122/70    Pulse 90    Temp 98.5 F (36.9 C) (Temporal)    Resp 16    Ht 5' 5.5" (1.664 m)    Wt 161 lb 3.2 oz (73.1 kg)    LMP  (LMP Unknown)    SpO2 98%    BMI 26.42 kg/m    Physical Exam Vitals reviewed.  Constitutional:      Appearance: Normal appearance.  HENT:     Head: Normocephalic and atraumatic.     Right Ear: Tympanic membrane normal.     Left Ear: Tympanic membrane normal.  Nose:     Comments: Bilateral nares slightly erythematous with clear nasal drainage noted.  Pharynx normal.  Ears normal.  Eyes normal.    Mouth/Throat:     Pharynx: Oropharynx is clear.  Eyes:     Conjunctiva/sclera: Conjunctivae normal.  Cardiovascular:     Rate and Rhythm: Normal rate and regular rhythm.     Heart sounds: Normal heart sounds. No murmur heard. Pulmonary:     Effort: Pulmonary effort is normal.     Breath sounds: Normal breath sounds.     Comments: Lungs clear to auscultation Musculoskeletal:        General:  Normal range of motion.     Cervical back: Normal range of motion and neck supple.  Skin:    General: Skin is warm and dry.  Neurological:     Mental Status: She is alert and oriented to person, place, and time.  Psychiatric:        Mood and Affect: Mood normal.        Behavior: Behavior normal.        Thought Content: Thought content normal.        Judgment: Judgment normal.    Diagnostics: FVC 3.76, FEV1 3.10.  Predicted FVC 3.67, predicted FEV1 2.96.  Spirometry indicates normal ventilatory function.  Assessment and Plan: 1. Not well controlled moderate persistent asthma   2. Seasonal and perennial allergic rhinitis   3. Seasonal allergic conjunctivitis   4. Gastroesophageal reflux disease, unspecified whether esophagitis present     Meds ordered this encounter  Medications   famotidine (PEPCID) 20 MG tablet    Sig: Take 1 tablet (20 mg total) by mouth 2 (two) times daily.    Dispense:  30 tablet    Refill:  5   Tiotropium Bromide Monohydrate (SPIRIVA RESPIMAT) 1.25 MCG/ACT AERS    Sig: Inhale 2 puffs into the lungs daily.    Dispense:  1 each    Refill:  1    Patient Instructions  Asthma Begin Spiriva 1.25 mcg 2 puffs once a day to control asthma Continue Symbicort 160-2 puffs twice a day with a spacer to prevent cough or wheeze Continue albuterol 2 puffs once every 4 hours as needed for cough or wheeze You may use albuterol 2 puffs 5 to 15 minutes before exercise to decrease cough or wheeze  Information provided about Fasenra. Call the clinic if you are interested beginning Fasenra  Allergic rhinitis Continue allergen avoidance measures directed toward pollen, mold, and dust mites Continue allergen immunotherapy and have access to an epinephrine auto-injector. Continue Xyzal 5 mg once a day as needed for runny nose or itch.Remember to rotate to a different antihistamine about every 3 months. Some examples of over the counter antihistamines include Zyrtec  (cetirizine), Xyzal (levocetirizine), Allegra (fexofenadine), and Claritin (loratidine).  Continue XHANCE 2 sprays in each nostril twice a day for nasal symptoms Continue nasal saline mist as you have been  Allergic conjunctivitis Some over the counter eye drops include Pataday one drop in each eye once a day as needed for red, itchy eyes OR Zaditor one drop in each eye twice a day as needed for red itchy eyes.  Reflux Continue dietary and lifestyle modifications as listed below Continue pantoprazole as previously prescribed Begin famotidine 20 mg twice a day to control reflux  Call the clinic if this treatment plan is not working well for you  Follow up in 2 months or sooner if needed.   Return in about 2  months (around 09/25/2021), or if symptoms worsen or fail to improve.    Thank you for the opportunity to care for this patient.  Please do not hesitate to contact me with questions.  Gareth Morgan, FNP [facility]

## 2021-07-28 NOTE — Patient Instructions (Addendum)
Asthma Begin Spiriva 1.25 mcg 2 puffs once a day to control asthma Continue Symbicort 160-2 puffs twice a day with a spacer to prevent cough or wheeze Continue albuterol 2 puffs once every 4 hours as needed for cough or wheeze You may use albuterol 2 puffs 5 to 15 minutes before exercise to decrease cough or wheeze  Information provided about Fasenra. Call the clinic if you are interested beginning Fasenra  Allergic rhinitis Continue allergen avoidance measures directed toward pollen, mold, and dust mites Continue allergen immunotherapy and have access to an epinephrine auto-injector. Continue Xyzal 5 mg once a day as needed for runny nose or itch.Remember to rotate to a different antihistamine about every 3 months. Some examples of over the counter antihistamines include Zyrtec (cetirizine), Xyzal (levocetirizine), Allegra (fexofenadine), and Claritin (loratidine).  Continue XHANCE 2 sprays in each nostril twice a day for nasal symptoms Continue nasal saline mist as you have been  Allergic conjunctivitis Some over the counter eye drops include Pataday one drop in each eye once a day as needed for red, itchy eyes OR Zaditor one drop in each eye twice a day as needed for red itchy eyes.  Reflux Continue dietary and lifestyle modifications as listed below Continue pantoprazole as previously prescribed Begin famotidine 20 mg twice a day to control reflux  Call the clinic if this treatment plan is not working well for you  Follow up in 2 months or sooner if needed.  Reducing Pollen Exposure The American Academy of Allergy, Asthma and Immunology suggests the following steps to reduce your exposure to pollen during allergy seasons. Do not hang sheets or clothing out to dry; pollen may collect on these items. Do not mow lawns or spend time around freshly cut grass; mowing stirs up pollen. Keep windows closed at night.  Keep car windows closed while driving. Minimize morning activities  outdoors, a time when pollen counts are usually at their highest. Stay indoors as much as possible when pollen counts or humidity is high and on windy days when pollen tends to remain in the air longer. Use air conditioning when possible.  Many air conditioners have filters that trap the pollen spores. Use a HEPA room air filter to remove pollen form the indoor air you breathe.  Control of Mold Allergen Mold and fungi can grow on a variety of surfaces provided certain temperature and moisture conditions exist.  Outdoor molds grow on plants, decaying vegetation and soil.  The major outdoor mold, Alternaria and Cladosporium, are found in very high numbers during hot and dry conditions.  Generally, a late Summer - Fall peak is seen for common outdoor fungal spores.  Rain will temporarily lower outdoor mold spore count, but counts rise rapidly when the rainy period ends.  The most important indoor molds are Aspergillus and Penicillium.  Dark, humid and poorly ventilated basements are ideal sites for mold growth.  The next most common sites of mold growth are the bathroom and the kitchen.  Outdoor [employer] Use air conditioning and keep windows closed Avoid exposure to decaying vegetation. Avoid leaf raking. Avoid grain handling. Consider wearing a face mask if working in moldy areas.  Indoor Mold Control Maintain humidity below 50%. Clean washable surfaces with 5% bleach solution. Remove sources e.g. Contaminated carpets.   Control of Dust Mite Allergen Dust mites play a major role in allergic asthma and rhinitis. They occur in environments with high humidity wherever human skin is found. Dust mites absorb humidity from the atmosphere (  ie, they do not drink) and feed on organic matter (including shed human and animal skin). Dust mites are a microscopic type of insect that you cannot see with the naked eye. High levels of dust mites have been detected from mattresses, pillows, carpets,  upholstered furniture, bed covers, clothes, soft toys and any woven material. The principal allergen of the dust mite is found in its feces. A gram of dust may contain 1,000 mites and 250,000 fecal particles. Mite antigen is easily measured in the air during house cleaning activities. Dust mites do not bite and do not cause harm to humans, other than by triggering allergies/asthma.  Ways to decrease your exposure to dust mites in your home:  1. Encase mattresses, box springs and pillows with a mite-impermeable barrier or cover  2. Wash sheets, blankets and drapes weekly in hot water (130 F) with detergent and dry them in a dryer on the hot setting.  3. Have the room cleaned frequently with a vacuum cleaner and a damp dust-mop. For carpeting or rugs, vacuuming with a vacuum cleaner equipped with a high-efficiency particulate air (HEPA) filter. The dust mite allergic individual should not be in a room which is being cleaned and should wait 1 hour after cleaning before going into the room.  4. Do not sleep on upholstered furniture (eg, couches).  5. If possible removing carpeting, upholstered furniture and drapery from the home is ideal. Horizontal blinds should be eliminated in the rooms where the person spends the most time (bedroom, study, television room). Washable vinyl, roller-type shades are optimal.  6. Remove all non-washable stuffed toys from the bedroom. Wash stuffed toys weekly like sheets and blankets above.  7. Reduce indoor humidity to less than 50%. Inexpensive humidity monitors can be purchased at most hardware stores. Do not use a humidifier as can make the problem worse and are not recommended.

## 2021-08-04 ENCOUNTER — Ambulatory Visit (INDEPENDENT_AMBULATORY_CARE_PROVIDER_SITE_OTHER): Payer: 59

## 2021-08-04 DIAGNOSIS — J309 Allergic rhinitis, unspecified: Secondary | ICD-10-CM | POA: Diagnosis not present

## 2021-10-25 ENCOUNTER — Other Ambulatory Visit: Payer: Self-pay | Admitting: Family Medicine

## 2021-12-01 ENCOUNTER — Other Ambulatory Visit: Payer: Self-pay | Admitting: Family Medicine

## 2021-12-21 ENCOUNTER — Other Ambulatory Visit: Payer: Self-pay | Admitting: Family Medicine

## 2022-01-02 ENCOUNTER — Other Ambulatory Visit: Payer: Self-pay | Admitting: Family Medicine

## 2022-01-04 ENCOUNTER — Encounter (HOSPITAL_BASED_OUTPATIENT_CLINIC_OR_DEPARTMENT_OTHER): Payer: Self-pay | Admitting: Obstetrics & Gynecology

## 2022-01-04 ENCOUNTER — Other Ambulatory Visit (HOSPITAL_COMMUNITY)
Admission: RE | Admit: 2022-01-04 | Discharge: 2022-01-04 | Disposition: A | Discharge status: Home / Self Care | Payer: 59 | Source: Ambulatory Visit | Attending: Obstetrics & Gynecology | Admitting: Obstetrics & Gynecology

## 2022-01-04 ENCOUNTER — Ambulatory Visit (INDEPENDENT_AMBULATORY_CARE_PROVIDER_SITE_OTHER): Payer: 59 | Admitting: Obstetrics & Gynecology

## 2022-01-04 VITALS — BP 120/75 | HR 85 | Ht 66.0 in | Wt 153.0 lb

## 2022-01-04 DIAGNOSIS — Z8742 Personal history of other diseases of the female genital tract: Secondary | ICD-10-CM | POA: Diagnosis not present

## 2022-01-04 DIAGNOSIS — Z8041 Family history of malignant neoplasm of ovary: Secondary | ICD-10-CM

## 2022-01-04 DIAGNOSIS — N870 Mild cervical dysplasia: Secondary | ICD-10-CM

## 2022-01-04 DIAGNOSIS — Z90710 Acquired absence of both cervix and uterus: Secondary | ICD-10-CM

## 2022-01-04 DIAGNOSIS — Z01419 Encounter for gynecological examination (general) (routine) without abnormal findings: Secondary | ICD-10-CM

## 2022-01-04 NOTE — Progress Notes (Signed)
46 y.o. G2P1 Divorced White or Caucasian female here for annual exam.  Has put her house on the market.  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  Has been offered a Orthoptist.  [place]    No LMP recorded (lmp unknown). Patient has had a hysterectomy.          Sexually active: Yes.    The current method of family planning is status post hysterectomy.    Exercising: Yes.    Smoker:  no  Health Maintenance: Pap:  2019 History of abnormal Pap:  LGSIL MMG:  07/11/21 neg Colonoscopy:  11/09/20, follow up 10 years BMD:   not indicated Screening Labs: 12/2020   reports that she has never smoked. She has never used smokeless tobacco. She reports current alcohol use of about 3.0 standard drinks of alcohol per week. She reports that she does not use drugs.  Past Medical History:  Diagnosis Date   Abnormal Pap smear    history of colposcopy ECC with atypical squamous metaplasia   Acid reflux    Allergy    seasonal allergies   Anxiety    on meds   Arthritis    bilateral shoulders   Asthma    uses inhaler PRN   Bilateral hearing loss    hearing aid in right ear and cochlear implant in left ear   Bursitis of shoulder, right    Congenital hearing loss    started age 54 -    Depression    no meds   Endometriosis 04/2009   Hx of migraines    increased frequency with estrogen containing OCPs   Panic attacks    Seasonal allergies     Past Surgical History:  Procedure Laterality Date   BREAST REDUCTION SURGERY  03/2009   COCHLEAR IMPLANT Left 2014   moval of Envoy device, right 05/2020   CYSTOSCOPY N/A 04/01/2018   Procedure: CYSTOSCOPY;  Surgeon: Megan Salon, MD;  Location: [facility];  Service: Gynecology;  Laterality: N/A;   IMPLANTATION OF ENVOY ESTEEM HEARING DEVICE     for hearing loss - now has been removed and replaced with cochlear implant 2014   IUD REMOVAL N/A 04/01/2018   Procedure: INTRAUTERINE DEVICE (IUD) REMOVAL  possible;  Surgeon:  Megan Salon, MD;  Location: [facility];  Service: Gynecology;  Laterality: N/A;  possible   TOTAL LAPAROSCOPIC HYSTERECTOMY WITH SALPINGECTOMY Bilateral 04/01/2018   Procedure: TOTAL LAPAROSCOPIC HYSTERECTOMY WITH SALPINGECTOMY WITH EXCISION OF ENDOMETRIOSIS;  Surgeon: Megan Salon, MD;  Location: [facility];  Service: Gynecology;  Laterality: Bilateral;   TUBAL LIGATION  04/2009   cautery of endometriosis   WISDOM TOOTH EXTRACTION      Current Outpatient Medications  Medication Sig Dispense Refill   albuterol (VENTOLIN HFA) 108 (90 Base) MCG/ACT inhaler Inhale 2 puffs into the lungs every 4 (four) hours as needed for wheezing or shortness of breath.     ALPRAZolam (XANAX) 0.5 MG tablet Take 1 tablet (0.5 mg total) by mouth 2 (two) times daily as needed for anxiety. 20 tablet 0   EPINEPHrine 0.3 mg/0.3 mL IJ SOAJ injection Inject 0.3 mg into the muscle as needed for anaphylaxis. 0.3 mL 1   famotidine (PEPCID) 20 MG tablet TAKE 1 TABLET BY MOUTH TWICE A DAY 180 tablet 1   Fluticasone Propionate (XHANCE) 93 MCG/ACT EXHU Place 2 sprays into both nostrils in the morning and at bedtime. 32 mL 5   levocetirizine (  XYZAL) 5 MG tablet TAKE 1 TABLET BY MOUTH EVERY DAY IN THE EVENING 30 tablet 0   Magnesium 250 MG TABS 1 tablet daily at 6 (six) AM.     Olopatadine HCl 0.2 % SOLN Place 1 drop into both eyes daily as needed (Itchy or watery eyes). 2.5 mL 5   ondansetron (ZOFRAN-ODT) 4 MG disintegrating tablet Take 4 mg by mouth every 8 (eight) hours as needed.     Probiotic Product (PROBIOTIC-10) CHEW Chew 1 each by mouth daily.      promethazine (PHENERGAN) 12.5 MG tablet 1-2 tablets every six hours as needed for nausea 30 tablet 0   SYMBICORT 160-4.5 MCG/ACT inhaler INHALE 2 PUFFS INTO THE LUNGS IN THE MORNING AND AT BEDTIME. 10.2 each 0   Ascorbic Acid (VITAMIN C) 1000 MG tablet Take 1,000 mg by mouth daily. (Patient not taking: Reported on 07/28/2021)     Cholecalciferol (VITAMIN D) 2000 units tablet Take  2,000 Units by mouth daily.  (Patient not taking: Reported on 01/04/2022)     ELDERBERRY PO Take 1 tablet by mouth daily. (Patient not taking: Reported on 01/04/2022)     lidocaine (LIDODERM) 5 % 1 patch daily. (Patient not taking: Reported on 06/16/2021)     Multiple Vitamins-Minerals (MULTIVITAMIN GUMMIES ADULT PO) Take 1 each by mouth daily. Super food (Patient not taking: Reported on 07/28/2021)     No current facility-administered medications for this visit.    Family History  Problem Relation Age of Onset   Deafness Mother    Diabetes Mother    Heart Problems Mother        tachycardia   Ovarian cancer Mother 55   Deafness Father    Hypertension Father    Cirrhosis Father        non alcoholic   COPD Father    Hyperlipidemia Father    Down syndrome Sister    Diabetes Paternal Grandmother    Allergic rhinitis Neg Hx    Angioedema Neg Hx    Asthma Neg Hx    Eczema Neg Hx    Immunodeficiency Neg Hx    Urticaria Neg Hx    Colon polyps Neg Hx    Colon cancer Neg Hx    Esophageal cancer Neg Hx    Rectal cancer Neg Hx    Stomach cancer Neg Hx    Gyn: Genitourinary:negative  Exam:   BP 120/75   Pulse 85   Ht '5\' 6"'$  (1.676 m)   Wt 153 lb (69.4 kg)   LMP  (LMP Unknown)   BMI 24.69 kg/m   Height: '5\' 6"'$  (167.6 cm)  General appearance: alert, cooperative and appears stated age Head: Normocephalic, without obvious abnormality, atraumatic Neck: no adenopathy, supple, symmetrical, trachea midline and thyroid normal to inspection and palpation Lungs: clear to auscultation bilaterally Breasts: normal appearance, no masses or tenderness Heart: regular rate and rhythm Abdomen: soft, non-tender; bowel sounds normal; no masses,  no organomegaly Extremities: extremities normal, atraumatic, no cyanosis or edema Skin: Skin color, texture, turgor normal. No rashes or lesions Lymph nodes: Cervical, supraclavicular, and axillary nodes normal. No abnormal inguinal nodes  palpated Neurologic: Grossly normal   Pelvic: External genitalia:  no lesions              Urethra:  normal appearing urethra with no masses, tenderness or lesions              Bartholins and Skenes: normal  Vagina: normal appearing vagina with normal color and no discharge, no lesions              Cervix: absent              Pap taken: Yes.   Bimanual Exam:  Uterus:  uterus absent              Adnexa: no mass, fullness, tenderness               Rectovaginal: Confirms               Anus:  normal sphincter tone, no lesions  Chaperone, Octaviano Batty, CMA, was present for exam.  Assessment/Plan:  1. Well woman exam with routine gynecological exam - pap and HR HPV obtained today - MMG 07/2021 - colonoscopy 11/2020, follow up 10 year - lab work done last June - vaccines reviewed/updated  2. History of abnormal cervical Pap smear  3. H/O: hysterectomy  4. Dysplasia of cervix, low grade (CIN 1)  5. Family history of ovarian cancer

## 2022-01-08 LAB — CYTOLOGY - PAP
Comment: NEGATIVE
Diagnosis: NEGATIVE
High risk HPV: NEGATIVE

## 2022-01-17 ENCOUNTER — Encounter (HOSPITAL_BASED_OUTPATIENT_CLINIC_OR_DEPARTMENT_OTHER): Payer: Self-pay | Admitting: Obstetrics & Gynecology

## 2022-01-18 ENCOUNTER — Other Ambulatory Visit (HOSPITAL_BASED_OUTPATIENT_CLINIC_OR_DEPARTMENT_OTHER): Payer: Self-pay | Admitting: Obstetrics & Gynecology

## 2022-01-18 DIAGNOSIS — F419 Anxiety disorder, unspecified: Secondary | ICD-10-CM

## 2022-01-18 MED ORDER — ALPRAZOLAM 0.5 MG PO TABS: 0.5000 mg | ORAL_TABLET | Freq: Two times a day (BID) | ORAL | 0 refills | Status: AC | PRN

## 2022-01-23 ENCOUNTER — Other Ambulatory Visit: Payer: Self-pay | Admitting: *Deleted

## 2022-01-23 MED ORDER — XHANCE 93 MCG/ACT NA EXHU: 2.0000 | INHALANT_SUSPENSION | Freq: Two times a day (BID) | NASAL | 5 refills | Status: DC

## 2022-07-16 ENCOUNTER — Other Ambulatory Visit: Payer: Self-pay | Admitting: *Deleted

## 2022-07-16 MED ORDER — XHANCE 93 MCG/ACT NA EXHU: 2.0000 | INHALANT_SUSPENSION | Freq: Two times a day (BID) | NASAL | 0 refills | Status: AC

## 2022-07-25 ENCOUNTER — Encounter (HOSPITAL_BASED_OUTPATIENT_CLINIC_OR_DEPARTMENT_OTHER): Payer: Self-pay | Admitting: *Deleted

## 2022-08-21 ENCOUNTER — Telehealth: Payer: Self-pay

## 2022-08-21 NOTE — Telephone Encounter (Signed)
Called patient - No DPR on file - LMOVM to contact office to schedule office visit for medication refills. LOV: 07/28/21 -  f/u in 2 mths.

## 2022-09-04 ENCOUNTER — Encounter (HOSPITAL_BASED_OUTPATIENT_CLINIC_OR_DEPARTMENT_OTHER): Payer: Self-pay | Admitting: Obstetrics & Gynecology

## 2022-09-05 ENCOUNTER — Ambulatory Visit (HOSPITAL_BASED_OUTPATIENT_CLINIC_OR_DEPARTMENT_OTHER): Payer: 59

## 2022-09-05 ENCOUNTER — Other Ambulatory Visit (HOSPITAL_COMMUNITY)
Admission: RE | Admit: 2022-09-05 | Discharge: 2022-09-05 | Disposition: A | Discharge status: Home / Self Care | Payer: 59 | Source: Ambulatory Visit | Attending: Obstetrics & Gynecology | Admitting: Obstetrics & Gynecology

## 2022-09-05 DIAGNOSIS — N898 Other specified noninflammatory disorders of vagina: Secondary | ICD-10-CM

## 2022-09-05 NOTE — Progress Notes (Signed)
Patient came in today to give a self-swab aptima sample. Patient is experiencing vaginal itching. Sample sent off for evaluation. tbw

## 2022-09-06 ENCOUNTER — Other Ambulatory Visit (HOSPITAL_BASED_OUTPATIENT_CLINIC_OR_DEPARTMENT_OTHER): Payer: Self-pay | Admitting: Obstetrics & Gynecology

## 2022-09-06 DIAGNOSIS — B372 Candidiasis of skin and nail: Secondary | ICD-10-CM

## 2022-09-06 LAB — CERVICOVAGINAL ANCILLARY ONLY
Bacterial Vaginitis (gardnerella): NEGATIVE
Candida Glabrata: NEGATIVE
Candida Vaginitis: POSITIVE — AB
Comment: NEGATIVE
Comment: NEGATIVE
Comment: NEGATIVE

## 2022-09-06 MED ORDER — FLUCONAZOLE 150 MG PO TABS: 150.0000 mg | ORAL_TABLET | Freq: Once | ORAL | 0 refills | Status: AC

## 2022-11-12 ENCOUNTER — Other Ambulatory Visit (HOSPITAL_COMMUNITY)
Admission: RE | Admit: 2022-11-12 | Discharge: 2022-11-12 | Disposition: A | Discharge status: Home / Self Care | Payer: 59 | Source: Ambulatory Visit | Attending: Obstetrics & Gynecology | Admitting: Obstetrics & Gynecology

## 2022-11-12 ENCOUNTER — Ambulatory Visit (INDEPENDENT_AMBULATORY_CARE_PROVIDER_SITE_OTHER): Payer: 59

## 2022-11-12 ENCOUNTER — Encounter (HOSPITAL_BASED_OUTPATIENT_CLINIC_OR_DEPARTMENT_OTHER): Payer: Self-pay

## 2022-11-12 VITALS — BP 129/75 | HR 71 | Ht 65.5 in | Wt 155.6 lb

## 2022-11-12 DIAGNOSIS — R3915 Urgency of urination: Secondary | ICD-10-CM | POA: Diagnosis not present

## 2022-11-12 DIAGNOSIS — N898 Other specified noninflammatory disorders of vagina: Secondary | ICD-10-CM

## 2022-11-12 LAB — POCT URINALYSIS DIPSTICK
Bilirubin, UA: NEGATIVE
Blood, UA: NEGATIVE
Glucose, UA: NEGATIVE
Ketones, UA: NEGATIVE
Leukocytes, UA: NEGATIVE
Nitrite, UA: NEGATIVE
Protein, UA: NEGATIVE
Spec Grav, UA: 1.015 (ref 1.010–1.025)
Urobilinogen, UA: 0.2 E.U./dL
pH, UA: 5.5 (ref 5.0–8.0)

## 2022-11-12 NOTE — Progress Notes (Signed)
Patient came in today with complaints of vaginal itching and lower abdominal pressure. Patient gave a urine sample to be evaluated and also a self-swab (Aptima). Advised patient that she would be notified of results and recommendations. tbw

## 2022-11-13 ENCOUNTER — Encounter (HOSPITAL_BASED_OUTPATIENT_CLINIC_OR_DEPARTMENT_OTHER): Payer: Self-pay | Admitting: Obstetrics & Gynecology

## 2022-11-13 LAB — CERVICOVAGINAL ANCILLARY ONLY
Bacterial Vaginitis (gardnerella): NEGATIVE
Candida Glabrata: NEGATIVE
Candida Vaginitis: NEGATIVE
Comment: NEGATIVE
Comment: NEGATIVE
Comment: NEGATIVE

## 2023-01-28 ENCOUNTER — Ambulatory Visit (HOSPITAL_BASED_OUTPATIENT_CLINIC_OR_DEPARTMENT_OTHER): Payer: 59 | Admitting: Obstetrics & Gynecology

## 2023-02-14 ENCOUNTER — Ambulatory Visit (HOSPITAL_BASED_OUTPATIENT_CLINIC_OR_DEPARTMENT_OTHER): Payer: 59 | Admitting: Obstetrics & Gynecology

## 2023-02-27 ENCOUNTER — Encounter (HOSPITAL_BASED_OUTPATIENT_CLINIC_OR_DEPARTMENT_OTHER): Payer: Self-pay

## 2023-02-27 ENCOUNTER — Ambulatory Visit (INDEPENDENT_AMBULATORY_CARE_PROVIDER_SITE_OTHER): Payer: 59

## 2023-02-27 ENCOUNTER — Encounter (HOSPITAL_BASED_OUTPATIENT_CLINIC_OR_DEPARTMENT_OTHER): Payer: Self-pay | Admitting: Obstetrics & Gynecology

## 2023-02-27 ENCOUNTER — Other Ambulatory Visit (HOSPITAL_BASED_OUTPATIENT_CLINIC_OR_DEPARTMENT_OTHER): Payer: Self-pay | Admitting: Obstetrics & Gynecology

## 2023-02-27 VITALS — BP 102/72 | HR 61 | Ht 65.5 in | Wt 159.0 lb

## 2023-02-27 DIAGNOSIS — R3915 Urgency of urination: Secondary | ICD-10-CM

## 2023-02-27 LAB — POCT URINALYSIS DIPSTICK
Bilirubin, UA: NEGATIVE
Blood, UA: NEGATIVE
Glucose, UA: NEGATIVE
Ketones, UA: NEGATIVE
Nitrite, UA: NEGATIVE
Protein, UA: NEGATIVE
Spec Grav, UA: <=1.005 — AB (ref 1.010–1.025)
Urobilinogen, UA: 0.2 E.U./dL
pH, UA: 5.5 (ref 5.0–8.0)

## 2023-02-27 MED ORDER — NITROFURANTOIN MONOHYD MACRO 100 MG PO CAPS: 100.0000 mg | ORAL_CAPSULE | Freq: Two times a day (BID) | ORAL | 0 refills | Status: DC

## 2023-02-27 NOTE — Progress Notes (Signed)
Patient came in today with complaints of urgency and some burning. Patient gave a sample to be evaluated. Sample evaluated and sent off for a urine culture. tbw

## 2023-03-01 ENCOUNTER — Ambulatory Visit (INDEPENDENT_AMBULATORY_CARE_PROVIDER_SITE_OTHER): Payer: 59 | Admitting: Obstetrics & Gynecology

## 2023-03-01 ENCOUNTER — Encounter (HOSPITAL_BASED_OUTPATIENT_CLINIC_OR_DEPARTMENT_OTHER): Payer: Self-pay | Admitting: Obstetrics & Gynecology

## 2023-03-01 VITALS — BP 105/79 | HR 66 | Ht 65.5 in | Wt 155.3 lb

## 2023-03-01 DIAGNOSIS — Z8041 Family history of malignant neoplasm of ovary: Secondary | ICD-10-CM

## 2023-03-01 DIAGNOSIS — Z01419 Encounter for gynecological examination (general) (routine) without abnormal findings: Secondary | ICD-10-CM

## 2023-03-01 DIAGNOSIS — Z8742 Personal history of other diseases of the female genital tract: Secondary | ICD-10-CM

## 2023-03-01 DIAGNOSIS — Z90710 Acquired absence of both cervix and uterus: Secondary | ICD-10-CM | POA: Diagnosis not present

## 2023-03-01 LAB — URINE CULTURE: Organism ID, Bacteria: NO GROWTH

## 2023-03-01 NOTE — Progress Notes (Signed)
47 y.o. G2P1 Divorced White or Caucasian female here for annual exam.  Denies vaginal bleeding.  Was seen earlier this week for possible UTI.  Antibiotics started and symptoms are better.  Culture is negative.  Topical estrogen cream or coconut oil for improved moisture discussed.  She really does not want to use estrogen products if possible.  This seems to only happens when she goes to the beach.   Continues to have some brain fog with menopause.  She did try Estoven but had migraines and stopped.  Headaches resolved.   No LMP recorded (lmp unknown). Patient has had a hysterectomy.          Sexually active: Yes.    The current method of family planning is status post hysterectomy.    Smoker:  no  Health Maintenance: Pap:  2023 neg with neg HR HPV History of abnormal Pap:  LGSIL MMG:  07/2022 Colonoscopy:  11/2020 BMD:   not indicated Screening Labs: does with Dr. Regino Schultze   reports that she has never smoked. She has never used smokeless tobacco. She reports current alcohol use of about 3.0 standard drinks of alcohol per week. She reports that she does not use drugs.  Past Medical History:  Diagnosis Date   Abnormal Pap smear    history of colposcopy ECC with atypical squamous metaplasia   Acid reflux    Allergy    seasonal allergies   Anxiety    on meds   Arthritis    bilateral shoulders   Asthma    uses inhaler PRN   Bilateral hearing loss    hearing aid in right ear and cochlear implant in left ear   Bursitis of shoulder, right    Congenital hearing loss    started age 31 -    Depression    no meds   Endometriosis 04/2009   Hx of migraines    increased frequency with estrogen containing OCPs   Panic attacks    Seasonal allergies     Past Surgical History:  Procedure Laterality Date   BREAST REDUCTION SURGERY  03/2009   COCHLEAR IMPLANT Left 2014   moval of Envoy device, right 05/2020   CYSTOSCOPY N/A 04/01/2018   Procedure: CYSTOSCOPY;  Surgeon: Jerene Bears, MD;   Location: [facility];  Service: Gynecology;  Laterality: N/A;   IMPLANTATION OF ENVOY ESTEEM HEARING DEVICE     for hearing loss - now has been removed and replaced with cochlear implant 2014   IUD REMOVAL N/A 04/01/2018   Procedure: INTRAUTERINE DEVICE (IUD) REMOVAL  possible;  Surgeon: Jerene Bears, MD;  Location: [facility];  Service: Gynecology;  Laterality: N/A;  possible   TOTAL LAPAROSCOPIC HYSTERECTOMY WITH SALPINGECTOMY Bilateral 04/01/2018   Procedure: TOTAL LAPAROSCOPIC HYSTERECTOMY WITH SALPINGECTOMY WITH EXCISION OF ENDOMETRIOSIS;  Surgeon: Jerene Bears, MD;  Location: [facility];  Service: Gynecology;  Laterality: Bilateral;   TUBAL LIGATION  04/2009   cautery of endometriosis   WISDOM TOOTH EXTRACTION      Current Outpatient Medications  Medication Sig Dispense Refill   albuterol (VENTOLIN HFA) 108 (90 Base) MCG/ACT inhaler Inhale 2 puffs into the lungs every 4 (four) hours as needed for wheezing or shortness of breath.     ALPRAZolam (XANAX) 0.5 MG tablet Take 1 tablet (0.5 mg total) by mouth 2 (two) times daily as needed for anxiety. 20 tablet 0   Ascorbic Acid (VITAMIN C) 1000 MG tablet Take 1,000 mg by mouth daily.  Cholecalciferol (VITAMIN D) 2000 units tablet Take 2,000 Units by mouth daily.     ELDERBERRY PO Take 1 tablet by mouth daily.     EPINEPHrine 0.3 mg/0.3 mL IJ SOAJ injection Inject 0.3 mg into the muscle as needed for anaphylaxis. 0.3 mL 1   famotidine (PEPCID) 20 MG tablet TAKE 1 TABLET BY MOUTH TWICE A DAY 180 tablet 1   levocetirizine (XYZAL) 5 MG tablet TAKE 1 TABLET BY MOUTH EVERY DAY IN THE EVENING 30 tablet 0   Magnesium 250 MG TABS 1 tablet daily at 6 (six) AM.     Probiotic Product (PROBIOTIC-10) CHEW Chew 1 each by mouth daily.      promethazine (PHENERGAN) 12.5 MG tablet 1-2 tablets every six hours as needed for nausea 30 tablet 0   WIXELA INHUB 250-50 MCG/ACT AEPB Inhale 1 puff into the lungs 2 (two) times daily.     XHANCE 93 MCG/ACT EXHU Place 2  sprays into both nostrils in the morning and at bedtime. 32 mL 0   No current facility-administered medications for this visit.    Family History  Problem Relation Age of Onset   Deafness Mother    Diabetes Mother    Heart Problems Mother        tachycardia   Ovarian cancer Mother 67   Deafness Father    Hypertension Father    Cirrhosis Father        non alcoholic   COPD Father    Hyperlipidemia Father    Down syndrome Sister    Diabetes Paternal Grandmother    Allergic rhinitis Neg Hx    Angioedema Neg Hx    Asthma Neg Hx    Eczema Neg Hx    Immunodeficiency Neg Hx    Urticaria Neg Hx    Colon polyps Neg Hx    Colon cancer Neg Hx    Esophageal cancer Neg Hx    Rectal cancer Neg Hx    Stomach cancer Neg Hx     ROS: Constitutional: negative Genitourinary:negative  Exam:   BP 105/79 (BP Location: Left Arm, Patient Position: Sitting, Cuff Size: Normal)   Pulse 66   Ht 5' 5.5" (1.664 m)   Wt 155 lb 4.8 oz (70.4 kg)   LMP  (LMP Unknown)   BMI 25.45 kg/m   Height: 5' 5.5" (166.4 cm)  General appearance: alert, cooperative and appears stated age Head: Normocephalic, without obvious abnormality, atraumatic Neck: no adenopathy, supple, symmetrical, trachea midline and thyroid normal to inspection and palpation Lungs: clear to auscultation bilaterally Breasts: normal appearance, no masses or tenderness Heart: regular rate and rhythm Abdomen: soft, non-tender; bowel sounds normal; no masses,  no organomegaly Extremities: extremities normal, atraumatic, no cyanosis or edema Skin: Skin color, texture, turgor normal. No rashes or lesions Lymph nodes: Cervical, supraclavicular, and axillary nodes normal. No abnormal inguinal nodes palpated Neurologic: Grossly normal   Pelvic: External genitalia:  no lesions              Urethra:  normal appearing urethra with no masses, tenderness or lesions              Bartholins and Skenes: normal                 Vagina: normal  appearing vagina with normal color and no discharge, no lesions              Cervix: absent  Pap taken: No. Bimanual Exam:  Uterus:  uterus absent              Adnexa: no mass, fullness, tenderness               Rectovaginal: Confirms               Anus:  normal sphincter tone, no lesions  Chaperone, Hendricks Milo, CMA, was present for exam.  Assessment/Plan: 1. Well woman exam with routine gynecological exam - Pap smear not indicated - Mammogram up to date - Colonoscopy 2022 - Bone mineral density not indicated - lab work done with PCP, Dr. Regino Schultze - vaccines reviewed/updated  2. Family history of ovarian cancer - we have discussed screening ultrasound and she currently is comfortable following with physical exams  3. History of abnormal cervical Pap smear  4. H/O: hysterectomy

## 2023-03-19 ENCOUNTER — Ambulatory Visit (HOSPITAL_BASED_OUTPATIENT_CLINIC_OR_DEPARTMENT_OTHER): Payer: 59

## 2023-05-07 ENCOUNTER — Ambulatory Visit (HOSPITAL_BASED_OUTPATIENT_CLINIC_OR_DEPARTMENT_OTHER): Payer: 59

## 2023-05-07 VITALS — BP 118/73 | HR 83 | Ht 65.5 in | Wt 153.8 lb

## 2023-05-07 DIAGNOSIS — R3915 Urgency of urination: Secondary | ICD-10-CM

## 2023-05-07 NOTE — Progress Notes (Signed)
Pt presented to office with symptoms of a possible uti. Pt stated symptoms started on yesterday and she has had urgency to urinate, and she had some burning while urinating. Pt instructed on urine sample. Urine sample obtained. Unable to obtain poc dipstick pt stated she took Azo pills this morning and she is aware we have to send urine off for culture.

## 2023-05-08 ENCOUNTER — Encounter (HOSPITAL_BASED_OUTPATIENT_CLINIC_OR_DEPARTMENT_OTHER): Payer: Self-pay | Admitting: Obstetrics & Gynecology

## 2023-05-08 MED ORDER — ESTRADIOL 0.1 MG/GM VA CREA: TOPICAL_CREAM | VAGINAL | 0 refills | Status: DC

## 2023-05-10 LAB — URINE CULTURE

## 2023-08-21 ENCOUNTER — Encounter (HOSPITAL_BASED_OUTPATIENT_CLINIC_OR_DEPARTMENT_OTHER): Payer: Self-pay

## 2023-10-15 ENCOUNTER — Encounter (HOSPITAL_BASED_OUTPATIENT_CLINIC_OR_DEPARTMENT_OTHER): Payer: Self-pay | Admitting: Obstetrics & Gynecology

## 2023-10-17 ENCOUNTER — Telehealth (HOSPITAL_BASED_OUTPATIENT_CLINIC_OR_DEPARTMENT_OTHER): Admitting: Obstetrics & Gynecology

## 2023-10-17 DIAGNOSIS — Z87440 Personal history of urinary (tract) infections: Secondary | ICD-10-CM

## 2023-10-17 DIAGNOSIS — N951 Menopausal and female climacteric states: Secondary | ICD-10-CM | POA: Insufficient documentation

## 2023-10-17 MED ORDER — ESTRADIOL 10 MCG VA TABS: ORAL_TABLET | VAGINAL | 1 refills | Status: DC

## 2023-10-17 MED ORDER — PROGESTERONE 200 MG PO CAPS: 200.0000 mg | ORAL_CAPSULE | Freq: Every day | ORAL | 1 refills | Status: DC

## 2023-10-17 NOTE — Progress Notes (Signed)
 Virtual Visit via Video Note  I connected with Margaret Ford on 10/17/23 at  1:55 PM EDT by a video enabled telemedicine application and verified that I am speaking with the correct person using two identifiers.  Location: Patient: home Provider: office   I discussed the limitations of evaluation and management by telemedicine and the availability of in person appointments. The patient expressed understanding and agreed to proceed.  History of Present Illness: 48 yo G1P1 DWF with menopausal symptoms including brain fog, hot flashes and h/o recurrent UTIs.  Has been using vaginal estrogen cream but did start having headaches again this with this so decreased to half the dosing and these have resolved.  We communicated with via mychart message before this appointment today and had discussed possibly using transdermal patch.  I was not aware that she was having headaches with the estradiol cream.  Discussed starting with oral progesterone first.  As well I think we should switch the vaginal cream to a topical tablet.  She finds it very messy anyway.  Risks of stroke with estrogen use particularly women with migraines associate with auras reviewed.  Feel we should exhaust this before proceeding with estrogen if possible.  She is comfortable with this plan.   Observations/Objective: Well-nourished well-developed white female in no acute distress  Assessment and Plan: 1. Vasomotor symptoms due to menopause (Primary) - She will start progesterone.  The 200 milligram Prometrium dose will be sent to pharmacy. - progesterone (PROMETRIUM) 200 MG capsule; Take 1 capsule (200 mg total) by mouth daily.  Dispense: 90 capsule; Refill: 1 - Advised patient to give an update in about 1 month or if she has any side effects  2. History of recurrent UTIs -Will stop using vaginal estrogen cream and switch to the topical vaginal estradiol tablets.  Administration discussed.  Questions answered. - Estradiol  (VAGIFEM) 10 MCG TABS vaginal tablet; 1 tablet PV twice weekly  Dispense: 24 tablet; Refill: 1   Follow Up Instructions: I discussed the assessment and treatment plan with the patient. The patient was provided an opportunity to ask questions and all were answered. The patient agreed with the plan and demonstrated an understanding of the instructions.   The patient was advised to call back or seek an in-person evaluation if the symptoms worsen or if the condition fails to improve as anticipated.  I provided 12 minutes of non-face-to-face time during this encounter.   Jerene Bears, MD

## 2023-10-22 ENCOUNTER — Ambulatory Visit (HOSPITAL_BASED_OUTPATIENT_CLINIC_OR_DEPARTMENT_OTHER)

## 2023-10-22 ENCOUNTER — Encounter (HOSPITAL_BASED_OUTPATIENT_CLINIC_OR_DEPARTMENT_OTHER): Payer: Self-pay

## 2023-10-22 VITALS — BP 115/58 | HR 66 | Ht 65.5 in | Wt 160.2 lb

## 2023-10-22 DIAGNOSIS — R319 Hematuria, unspecified: Secondary | ICD-10-CM

## 2023-10-22 LAB — POCT URINALYSIS DIPSTICK
Bilirubin, UA: NEGATIVE
Blood, UA: NEGATIVE
Glucose, UA: NEGATIVE
Ketones, UA: NEGATIVE
Leukocytes, UA: NEGATIVE
Nitrite, UA: NEGATIVE
Protein, UA: NEGATIVE
Spec Grav, UA: 1.01 (ref 1.010–1.025)
Urobilinogen, UA: 0.2 U/dL
pH, UA: 6.5 (ref 5.0–8.0)

## 2023-10-22 NOTE — Progress Notes (Signed)
 NURSE VISIT- UTI SYMPTOMS   SUBJECTIVE:  Margaret Ford is a 48 y.o. G2P1 female here for UTI symptoms. She is a GYN patient. She reports hematuria.  OBJECTIVE:  BP (!) 115/58 (BP Location: Right Arm, Patient Position: Sitting, Cuff Size: Large)   Pulse 66   Ht 5' 5.5" (1.664 m)   Wt 160 lb 3.2 oz (72.7 kg)   LMP  (LMP Unknown)   BMI 26.25 kg/m   Appears well, in no apparent distress  No results found for this or any previous visit (from the past 24 hours).  ASSESSMENT: GYN patient with UTI symptoms and negative nitrites  PLAN: Visit routed to or discussed with:  Rx sent today: No Urine culture sent Call or return to clinic prn if these symptoms worsen or fail to improve as anticipated. Follow-up: as needed

## 2023-10-24 ENCOUNTER — Encounter (HOSPITAL_BASED_OUTPATIENT_CLINIC_OR_DEPARTMENT_OTHER): Payer: Self-pay | Admitting: Certified Nurse Midwife

## 2023-10-24 LAB — URINE CULTURE

## 2023-11-20 ENCOUNTER — Other Ambulatory Visit (HOSPITAL_BASED_OUTPATIENT_CLINIC_OR_DEPARTMENT_OTHER): Payer: Self-pay | Admitting: Obstetrics & Gynecology

## 2023-11-20 DIAGNOSIS — N951 Menopausal and female climacteric states: Secondary | ICD-10-CM

## 2023-11-20 MED ORDER — PROGESTERONE MICRONIZED 100 MG PO CAPS: 100.0000 mg | ORAL_CAPSULE | Freq: Every day | ORAL | 2 refills | Status: DC

## 2024-02-10 ENCOUNTER — Telehealth (HOSPITAL_BASED_OUTPATIENT_CLINIC_OR_DEPARTMENT_OTHER): Payer: Self-pay | Admitting: Obstetrics & Gynecology

## 2024-02-10 ENCOUNTER — Encounter (HOSPITAL_BASED_OUTPATIENT_CLINIC_OR_DEPARTMENT_OTHER): Payer: Self-pay | Admitting: Obstetrics & Gynecology

## 2024-02-10 NOTE — Telephone Encounter (Signed)
 New message    1. Which medications need to be refilled? (please list name of each medication and dose if known) progesterone  (PROMETRIUM ) 100 MG capsule    2. Which pharmacy/location (including street and city if local pharmacy) is medication to be sent to? [facility]    3. Do they need a 30 day or 90 day supply? 30 day supply

## 2024-02-11 ENCOUNTER — Other Ambulatory Visit (HOSPITAL_BASED_OUTPATIENT_CLINIC_OR_DEPARTMENT_OTHER): Payer: Self-pay | Admitting: Obstetrics & Gynecology

## 2024-02-11 DIAGNOSIS — N951 Menopausal and female climacteric states: Secondary | ICD-10-CM

## 2024-02-11 DIAGNOSIS — Z87440 Personal history of urinary (tract) infections: Secondary | ICD-10-CM

## 2024-02-11 MED ORDER — PROGESTERONE MICRONIZED 100 MG PO CAPS: 100.0000 mg | ORAL_CAPSULE | Freq: Every day | ORAL | 1 refills | Status: DC

## 2024-02-11 MED ORDER — ESTRADIOL 10 MCG VA TABS: ORAL_TABLET | VAGINAL | 1 refills | Status: AC

## 2024-03-03 NOTE — Progress Notes (Unsigned)
   ANNUAL EXAM Patient name: Margaret Ford MRN [id number]  Date of birth: [birth year] Chief Complaint:   No chief complaint on file.  History of Present Illness:   Margaret Ford is a 48 y.o. G2P1 Caucasian female being seen today for a routine annual exam.  Current complaints: ***  No LMP recorded (lmp unknown). Patient has had a hysterectomy.   The pregnancy intention screening data noted above was reviewed. Potential methods of contraception were discussed. The patient elected to proceed with No data recorded.   Last pap 01/04/2022. Results were: NILM w/ HRHPV negative. H/O abnormal pap: yes - LGSIL Last mammogram: 08/15/2023. Results were: normal. [facility] at [facility]. Family h/o breast cancer: {yes***/no:23838} Last colonoscopy: 11/2020. Results were: normal. Family h/o colorectal cancer: {yes***/no:23838}     10/22/2023    1:57 PM 03/01/2023   11:24 AM 02/27/2023    1:53 PM 11/12/2022    9:36 AM 01/04/2022    8:28 AM  Depression screen PHQ 2/9  Decreased Interest 0 0 0 0 0  Down, Depressed, Hopeless 0 0 0 0 0  PHQ - 2 Score 0 0 0 0 0         No data to display           Review of Systems:   Pertinent items are noted in HPI Denies any headaches, blurred vision, fatigue, shortness of breath, chest pain, abdominal pain, abnormal vaginal discharge/itching/odor/irritation, problems with periods, bowel movements, urination, or intercourse unless otherwise stated above. Pertinent History Reviewed:  Reviewed past medical,surgical, social and family history.  Reviewed problem list, medications and allergies. Physical Assessment:  There were no vitals filed for this visit.There is no height or weight on file to calculate BMI.        Physical Examination:   General appearance - well appearing, and in no distress  Mental status - alert, oriented to person, place, and time  Psych:  She has a normal mood and affect  Skin - warm and dry, normal color, no suspicious lesions  noted  Chest - effort normal, all lung fields clear to auscultation bilaterally  Heart - normal rate and regular rhythm  Neck:  midline trachea, no thyromegaly or nodules  Breasts - breasts appear normal, no suspicious masses, no skin or nipple changes or  axillary nodes  Abdomen - soft, nontender, nondistended, no masses or organomegaly  Pelvic - VULVA: normal appearing vulva with no masses, tenderness or lesions  VAGINA: normal appearing vagina with normal color and discharge, no lesions  CERVIX: normal appearing cervix without discharge or lesions, no CMT  Thin prep pap is {Desc; done/not:10129} *** HR HPV cotesting  UTERUS: uterus is felt to be normal size, shape, consistency and nontender   ADNEXA: No adnexal masses or tenderness noted.  Rectal - normal rectal, good sphincter tone, no masses felt. Hemoccult: ***  Extremities:  No swelling or varicosities noted  Chaperone present for exam  No results found for this or any previous visit (from the past 24 hours).  Assessment & Plan:  1) Well-Woman Exam  2) ***  Labs/procedures today: ***  Mammogram: {Mammo f/u:25212::@ 48yo}, or sooner if problems Colonoscopy: {TCS f/u:25213::@ 48yo}, or sooner if problems  No orders of the defined types were placed in this encounter.   Meds: No orders of the defined types were placed in this encounter.   Follow-up: No follow-ups on file.  Sprague, Kaitlyn E, RN 03/03/2024 10:46 PM

## 2024-03-04 ENCOUNTER — Ambulatory Visit (HOSPITAL_BASED_OUTPATIENT_CLINIC_OR_DEPARTMENT_OTHER): Admitting: Obstetrics & Gynecology

## 2024-03-04 ENCOUNTER — Encounter (HOSPITAL_BASED_OUTPATIENT_CLINIC_OR_DEPARTMENT_OTHER): Payer: Self-pay | Admitting: Obstetrics & Gynecology

## 2024-03-04 VITALS — BP 107/80 | HR 84

## 2024-03-04 DIAGNOSIS — Z01419 Encounter for gynecological examination (general) (routine) without abnormal findings: Secondary | ICD-10-CM

## 2024-03-04 DIAGNOSIS — N951 Menopausal and female climacteric states: Secondary | ICD-10-CM

## 2024-03-04 DIAGNOSIS — Z90710 Acquired absence of both cervix and uterus: Secondary | ICD-10-CM | POA: Diagnosis not present

## 2024-03-04 DIAGNOSIS — Z9229 Personal history of other drug therapy: Secondary | ICD-10-CM

## 2024-03-04 DIAGNOSIS — Z87440 Personal history of urinary (tract) infections: Secondary | ICD-10-CM | POA: Diagnosis not present

## 2024-08-07 ENCOUNTER — Other Ambulatory Visit (HOSPITAL_BASED_OUTPATIENT_CLINIC_OR_DEPARTMENT_OTHER): Payer: Self-pay | Admitting: Obstetrics & Gynecology

## 2024-08-07 DIAGNOSIS — N951 Menopausal and female climacteric states: Secondary | ICD-10-CM

## 2025-03-09 ENCOUNTER — Ambulatory Visit (HOSPITAL_BASED_OUTPATIENT_CLINIC_OR_DEPARTMENT_OTHER): Admitting: Obstetrics & Gynecology
